# Patient Record
Sex: Male | Born: 1960 | Race: White | Hispanic: No | Marital: Married | Smoking: Current every day smoker
Health system: Southern US, Community
[De-identification: ages and names within clinical notes are randomized; demographics above are authoritative.]

## PROBLEM LIST (undated history)

## (undated) DIAGNOSIS — I77810 Thoracic aortic ectasia: Secondary | ICD-10-CM

## (undated) DIAGNOSIS — K219 Gastro-esophageal reflux disease without esophagitis: Secondary | ICD-10-CM

## (undated) DIAGNOSIS — I350 Nonrheumatic aortic (valve) stenosis: Secondary | ICD-10-CM

## (undated) DIAGNOSIS — J189 Pneumonia, unspecified organism: Secondary | ICD-10-CM

## (undated) DIAGNOSIS — E785 Hyperlipidemia, unspecified: Secondary | ICD-10-CM

## (undated) DIAGNOSIS — G4733 Obstructive sleep apnea (adult) (pediatric): Secondary | ICD-10-CM

## (undated) HISTORY — PX: ANTERIOR CRUCIATE LIGAMENT REPAIR: SHX115

## (undated) HISTORY — DX: Hyperlipidemia, unspecified: E78.5

## (undated) HISTORY — PX: VASECTOMY: SHX75

## (undated) HISTORY — DX: Gastro-esophageal reflux disease without esophagitis: K21.9

## (undated) HISTORY — DX: Obstructive sleep apnea (adult) (pediatric): G47.33

## (undated) HISTORY — PX: EYE SURGERY: SHX253

## (undated) HISTORY — DX: Nonrheumatic aortic (valve) stenosis: I35.0

---

## 2018-11-07 ENCOUNTER — Other Ambulatory Visit: Payer: Self-pay

## 2018-11-07 ENCOUNTER — Encounter: Payer: Self-pay | Admitting: Cardiology

## 2018-11-07 ENCOUNTER — Ambulatory Visit (INDEPENDENT_AMBULATORY_CARE_PROVIDER_SITE_OTHER): Payer: 59 | Admitting: Cardiology

## 2018-11-07 VITALS — BP 126/88 | HR 65 | Ht 69.0 in | Wt 190.4 lb

## 2018-11-07 DIAGNOSIS — E782 Mixed hyperlipidemia: Secondary | ICD-10-CM

## 2018-11-07 DIAGNOSIS — R079 Chest pain, unspecified: Secondary | ICD-10-CM

## 2018-11-07 DIAGNOSIS — R011 Cardiac murmur, unspecified: Secondary | ICD-10-CM | POA: Diagnosis not present

## 2018-11-07 DIAGNOSIS — R0600 Dyspnea, unspecified: Secondary | ICD-10-CM

## 2018-11-07 DIAGNOSIS — Q2381 Bicuspid aortic valve: Secondary | ICD-10-CM

## 2018-11-07 DIAGNOSIS — Z13220 Encounter for screening for lipoid disorders: Secondary | ICD-10-CM

## 2018-11-07 DIAGNOSIS — Q231 Congenital insufficiency of aortic valve: Secondary | ICD-10-CM

## 2018-11-07 DIAGNOSIS — Z720 Tobacco use: Secondary | ICD-10-CM

## 2018-11-07 DIAGNOSIS — R0789 Other chest pain: Secondary | ICD-10-CM

## 2018-11-07 HISTORY — DX: Bicuspid aortic valve: Q23.81

## 2018-11-07 HISTORY — DX: Congenital insufficiency of aortic valve: Q23.1

## 2018-11-07 HISTORY — DX: Chest pain, unspecified: R07.9

## 2018-11-07 HISTORY — DX: Mixed hyperlipidemia: E78.2

## 2018-11-07 LAB — LIPID PANEL
Chol/HDL Ratio: 4.7 ratio (ref 0.0–5.0)
Cholesterol, Total: 213 mg/dL — ABNORMAL HIGH (ref 100–199)
HDL: 45 mg/dL (ref >39)
LDL Chol Calc (NIH): 148 mg/dL — ABNORMAL HIGH (ref 0–99)
Triglycerides: 111 mg/dL (ref 0–149)
VLDL Cholesterol Cal: 20 mg/dL (ref 5–40)

## 2018-11-07 LAB — BASIC METABOLIC PANEL
BUN/Creatinine Ratio: 15 (ref 9–20)
BUN: 13 mg/dL (ref 6–24)
CO2: 26 mmol/L (ref 20–29)
Calcium: 9.6 mg/dL (ref 8.7–10.2)
Chloride: 104 mmol/L (ref 96–106)
Creatinine, Ser: 0.84 mg/dL (ref 0.76–1.27)
GFR calc Af Amer: 113 mL/min/{1.73_m2} (ref >59)
GFR calc non Af Amer: 98 mL/min/{1.73_m2} (ref >59)
Glucose: 90 mg/dL (ref 65–99)
Potassium: 4.7 mmol/L (ref 3.5–5.2)
Sodium: 142 mmol/L (ref 134–144)

## 2018-11-07 LAB — MAGNESIUM: Magnesium: 1.9 mg/dL (ref 1.6–2.3)

## 2018-11-07 MED ORDER — NITROGLYCERIN 0.4 MG SL SUBL: 0.4000 mg | SUBLINGUAL_TABLET | SUBLINGUAL | 5 refills | Status: DC | PRN

## 2018-11-07 NOTE — Patient Instructions (Signed)
Medication Instructions:  Your physician recommends that you continue on your current medications as directed. Please refer to the Current Medication list given to you today.  If you need a refill on your cardiac medications before your next appointment, please call your pharmacy.   Lab work:Your physician recommends that you return for lab work in: Today BMP,Magnesium.Lipid  If you have labs (blood work) drawn today and your tests are completely normal, you will receive your results only by: Marland Kitchen MyChart Message (if you have MyChart) OR . A paper copy in the mail If you have any lab test that is abnormal or we need to change your treatment, we will call you to review the results.  Testing/Procedures: Your physician has requested that you have an echocardiogram. Echocardiography is a painless test that uses sound waves to create images of your heart. It provides your doctor with information about the size and shape of your heart and how well your heart's chambers and valves are working. This procedure takes approximately one hour. There are no restrictions for this procedure.    Follow-Up: At [facility], you and your health needs are our priority.  As part of our continuing mission to provide you with exceptional heart care, we have created designated Provider Care Teams.  These Care Teams include your primary Cardiologist (physician) and Advanced Practice Providers (APPs -  Physician Assistants and Nurse Practitioners) who all work together to provide you with the care you need, when you need it. You will need a follow up appointment in 1 months with Dr Harriet Masson Any Other Special Instructions Will Be Listed Below (If Applicable).   Echocardiogram An echocardiogram is a procedure that uses painless sound waves (ultrasound) to produce an image of the heart. Images from an echocardiogram can provide important information about:  Signs of coronary artery disease (CAD).  Aneurysm detection. An  aneurysm is a weak or damaged part of an artery wall that bulges out from the normal force of blood pumping through the body.  Heart size and shape. Changes in the size or shape of the heart can be associated with certain conditions, including heart failure, aneurysm, and CAD.  Heart muscle function.  Heart valve function.  Signs of a past heart attack.  Fluid buildup around the heart.  Thickening of the heart muscle.  A tumor or infectious growth around the heart valves. Tell a health care provider about:  Any allergies you have.  All medicines you are taking, including vitamins, herbs, eye drops, creams, and over-the-counter medicines.  Any blood disorders you have.  Any surgeries you have had.  Any medical conditions you have.  Whether you are pregnant or may be pregnant. What are the risks? Generally, this is a safe procedure. However, problems may occur, including:  Allergic reaction to dye (contrast) that may be used during the procedure. What happens before the procedure? No specific preparation is needed. You may eat and drink normally. What happens during the procedure?   An IV tube may be inserted into one of your veins.  You may receive contrast through this tube. A contrast is an injection that improves the quality of the pictures from your heart.  A gel will be applied to your chest.  A wand-like tool (transducer) will be moved over your chest. The gel will help to transmit the sound waves from the transducer.  The sound waves will harmlessly bounce off of your heart to allow the heart images to be captured in real-time motion.  The images will be recorded on a computer. The procedure may vary among health care providers and hospitals. What happens after the procedure?  You may return to your normal, everyday life, including diet, activities, and medicines, unless your health care provider tells you not to do that. Summary  An echocardiogram is a  procedure that uses painless sound waves (ultrasound) to produce an image of the heart.  Images from an echocardiogram can provide important information about the size and shape of your heart, heart muscle function, heart valve function, and fluid buildup around your heart.  You do not need to do anything to prepare before this procedure. You may eat and drink normally.  After the echocardiogram is completed, you may return to your normal, everyday life, unless your health care provider tells you not to do that. This information is not intended to replace advice given to you by your health care provider. Make sure you discuss any questions you have with your health care provider. Document Released: 01/31/2000 Document Revised: 05/26/2018 Document Reviewed: 03/07/2016 Elsevier Patient Education  2020 [employer].

## 2018-11-07 NOTE — Progress Notes (Signed)
Cardiology Office Note:    Date:  11/07/2018   ID:  Cody Hebert, DOB 1961/01/20, MRN [id number]  PCP:  Marylen Ponto, MD  Cardiologist:  No primary care provider on file.  Electrophysiologist:  None   Referring MD: Marylen Ponto, MD   The patient self-referred  History of Present Illness:    Cody Hebert is a 58 y.o. male with a hx of GERD, hyperlipidemia he states he was told that he had a bicuspid aortic valve.  He saw a cardiologist about 10 years ago for a DOT physical clearance and had not follow-up since.  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  The patient reluctantly tells me that he is been having intermittent (about once a month) or or so he experiences left-sided chest tightness on exertion.  He describes it as a squeezing sensation which lasts about 2 to 3 minutes and resolved.  He notes at times it is associated shortness of breath.  He denies any radiation.  He denies any lightheadedness, nausea, tingling.  He is not experiencing chest pain today.  Past Medical History:  Diagnosis Date  . GERD (gastroesophageal reflux disease)   . Hyperlipidemia     Past Surgical History:  Procedure Laterality Date  . ANTERIOR CRUCIATE LIGAMENT REPAIR    . EYE SURGERY    . VASECTOMY      Current Medications: Current Meds  Medication Sig  . loratadine (CLARITIN) 10 MG tablet Take 10 mg by mouth daily as needed for allergies.  Marland Kitchen omeprazole (PRILOSEC) 20 MG capsule Take 20 mg by mouth daily.    Allergies:   Doxycycline   Social History   Socioeconomic History  . Marital status: Single    Spouse name: Not on file  . Number of children: Not on file  . Years of education: Not on file  . Highest education level: Not on file  Occupational History  . Not on file  Social Needs  . Financial resource strain: Not on file  . Food insecurity    Worry: Not on file    Inability: Not on file  . Transportation  needs    Medical: Not on file    Non-medical: Not on file  Tobacco Use  . Smoking status: Current Every Day Smoker    Packs/day: 2.00    Years: 40.00    Pack years: 80.00    Types: Cigarettes  . Smokeless tobacco: Never Used  Substance and Sexual Activity  . Alcohol use: Yes  . Drug use: Never  . Sexual activity: Not on file  Lifestyle  . Physical activity    Days per week: Not on file    Minutes per session: Not on file  . Stress: Not on file  Relationships  . Social Musician on phone: Not on file    Gets together: Not on file    Attends religious service: Not on file    Active member of club or organization: Not on file    Attends meetings of clubs or organizations: Not on file    Relationship status: Not on file  Other Topics Concern  . Not on file  Social History Narrative  . Not on file     Family History: The patient's family history includes Cancer in his father; Congestive Heart Failure in his maternal grandmother; Diabetes in his brother.  ROS:   Review of Systems  Constitution: Negative  for decreased appetite, fever and weight gain.  HENT: Negative for congestion, ear discharge, hoarse voice and sore throat.   Eyes: Negative for discharge, redness, vision loss in right eye and visual halos.  Cardiovascular: He reports chest pain and shortness of breath.  Negative for leg swelling, orthopnea and palpitations.  Respiratory: Negative for cough, hemoptysis, shortness of breath and snoring.   Endocrine: Negative for heat intolerance and polyphagia.  Hematologic/Lymphatic: Negative for bleeding problem. Does not bruise/bleed easily.  Skin: Negative for flushing, nail changes, rash and suspicious lesions.  Musculoskeletal: Negative for arthritis, joint pain, muscle cramps, myalgias, neck pain and stiffness.  Gastrointestinal: Negative for abdominal pain, bowel incontinence, diarrhea and excessive appetite.  Genitourinary: Negative for decreased libido,  genital sores and incomplete emptying.  Neurological: Negative for brief paralysis, focal weakness, headaches and loss of balance.  Psychiatric/Behavioral: Negative for altered mental status, depression and suicidal ideas.  Allergic/Immunologic: Negative for HIV exposure and persistent infections.   EKGs/Labs/Other Studies Reviewed:    The following studies were reviewed today:  EKG:  The ekg ordered today demonstrates sinus rhythm, heart rate 65 bpm, there is evidence of left atrial abnormality with left ventricle hypertrophy.  No previous EKG to compare  Recent Labs: No results found for requested labs within last 8760 hours.  Recent Lipid Panel No results found for: CHOL, TRIG, HDL, CHOLHDL, VLDL, LDLCALC, LDLDIRECT  Physical Exam:    VS:  BP 126/88 (BP Location: Left Arm, Patient Position: Sitting, Cuff Size: Normal)   Pulse 65   Ht 5\' 9"  (1.753 m)   Wt 190 lb 6.4 oz (86.4 kg)   SpO2 98%   BMI 28.12 kg/m     Wt Readings from Last 3 Encounters:  11/07/18 190 lb 6.4 oz (86.4 kg)     GEN: Well nourished, well developed in no acute distress HEENT: Normal NECK: No JVD; No carotid bruits LYMPHATICS: No lymphadenopathy CARDIAC: S1S2 noted,RRR, II/VI midsystolic ejection murmurs heard over the precordium, rubs, gallops RESPIRATORY:  Clear to auscultation without rales, wheezing or rhonchi  ABDOMEN: Soft, non-tender, non-distended, +bowel sounds, no guarding. EXTREMITIES: No edema, No cyanosis, no clubbing MUSCULOSKELETAL:  No edema; No deformity  SKIN: Warm and dry, tattoos present  History of hyperlipidemia hyperlipidemia NEUROLOGIC:  Alert and oriented x 3, non-focal PSYCHIATRIC:  Normal affect, good insight  ASSESSMENT:    1. Bicuspid aortic valve   2. Systolic ejection murmur   3. Dyspnea, unspecified type   4. Mixed hyperlipidemia   5. Screening, lipid   6. Chest pain of uncertain etiology   7. Tobacco abuse    PLAN:    In order of problems listed above:  1.   I have ordered a transthoracic echocardiogram and this is important especially in the setting of a history of bicuspid valve and he has low pitched mid systolic ejection murmur.    2.  His chest pain does sound atypical however he does have risk factors for coronary disease; smoking, hyperlipidemia and age.  But at this juncture it is important to understand if he indeed have aortic stenosis from his bicuspid valve and the severity.  This will help guide his ischemia work-up.  As if he does have severe aortic stenosis we would proceed to left heart catheterization as opposed to stress testing.  3.  The patient was counseled on tobacco cessation today.  Counseling included reviewing the risks of smoking tobacco products, how it impacts the patient's current medical diagnoses and different strategies for quitting.  Pharmacotherapy to aid in tobacco cessation was not prescribed today. The patient coordinate with  primary care provider.  The patient was also advised to call   1-800-QUIT-NOW ([phone]) for additional help with quitting smoking.  4.  Blood work will be performed today which will include lipid panel.   The patient is in agreement with the above plan. The patient left the office in stable condition.  The patient will follow up in 1 month or sooner if needed.   Medication Adjustments/Labs and Tests Ordered: Current medicines are reviewed at length with the patient today.  Concerns regarding medicines are outlined above.  Orders Placed This Encounter  Procedures  . Basic Metabolic Panel (BMET)  . Magnesium  . Lipid Profile  . EKG 12-Lead  . ECHOCARDIOGRAM COMPLETE   Meds ordered this encounter  Medications  . DISCONTD: nitroGLYCERIN (NITROSTAT) 0.4 MG SL tablet    Sig: Place 1 tablet (0.4 mg total) under the tongue every 5 (five) minutes as needed for chest pain.    Dispense:  25 tablet    Refill:  5    Patient Instructions  Medication Instructions:  Your physician  recommends that you continue on your current medications as directed. Please refer to the Current Medication list given to you today.  If you need a refill on your cardiac medications before your next appointment, please call your pharmacy.   Lab work:Your physician recommends that you return for lab work in: Today BMP,Magnesium.Lipid  If you have labs (blood work) drawn today and your tests are completely normal, you will receive your results only by: Marland Kitchen. MyChart Message (if you have MyChart) OR . A paper copy in the mail If you have any lab test that is abnormal or we need to change your treatment, we will call you to review the results.  Testing/Procedures: Your physician has requested that you have an echocardiogram. Echocardiography is a painless test that uses sound waves to create images of your heart. It provides your doctor with information about the size and shape of your heart and how well your heart's chambers and valves are working. This procedure takes approximately one hour. There are no restrictions for this procedure.    Follow-Up: At [facility], you and your health needs are our priority.  As part of our continuing mission to provide you with exceptional heart care, we have created designated Provider Care Teams.  These Care Teams include your primary Cardiologist (physician) and Advanced Practice Providers (APPs -  Physician Assistants and Nurse Practitioners) who all work together to provide you with the care you need, when you need it. You will need a follow up appointment in 1 months with Dr Servando Salinaobb Any Other Special Instructions Will Be Listed Below (If Applicable).   Echocardiogram An echocardiogram is a procedure that uses painless sound waves (ultrasound) to produce an image of the heart. Images from an echocardiogram can provide important information about:  Signs of coronary artery disease (CAD).  Aneurysm detection. An aneurysm is a weak or damaged part of an  artery wall that bulges out from the normal force of blood pumping through the body.  Heart size and shape. Changes in the size or shape of the heart can be associated with certain conditions, including heart failure, aneurysm, and CAD.  Heart muscle function.  Heart valve function.  Signs of a past heart attack.  Fluid buildup around the heart.  Thickening of the heart muscle.  A tumor or infectious growth around the heart  valves. Tell a health care provider about:  Any allergies you have.  All medicines you are taking, including vitamins, herbs, eye drops, creams, and over-the-counter medicines.  Any blood disorders you have.  Any surgeries you have had.  Any medical conditions you have.  Whether you are pregnant or may be pregnant. What are the risks? Generally, this is a safe procedure. However, problems may occur, including:  Allergic reaction to dye (contrast) that may be used during the procedure. What happens before the procedure? No specific preparation is needed. You may eat and drink normally. What happens during the procedure?   An IV tube may be inserted into one of your veins.  You may receive contrast through this tube. A contrast is an injection that improves the quality of the pictures from your heart.  A gel will be applied to your chest.  A wand-like tool (transducer) will be moved over your chest. The gel will help to transmit the sound waves from the transducer.  The sound waves will harmlessly bounce off of your heart to allow the heart images to be captured in real-time motion. The images will be recorded on a computer. The procedure may vary among health care providers and hospitals. What happens after the procedure?  You may return to your normal, everyday life, including diet, activities, and medicines, unless your health care provider tells you not to do that. Summary  An echocardiogram is a procedure that uses painless sound waves  (ultrasound) to produce an image of the heart.  Images from an echocardiogram can provide important information about the size and shape of your heart, heart muscle function, heart valve function, and fluid buildup around your heart.  You do not need to do anything to prepare before this procedure. You may eat and drink normally.  After the echocardiogram is completed, you may return to your normal, everyday life, unless your health care provider tells you not to do that. This information is not intended to replace advice given to you by your health care provider. Make sure you discuss any questions you have with your health care provider. Document Released: 01/31/2000 Document Revised: 05/26/2018 Document Reviewed: 03/07/2016 Elsevier Patient Education  2020 [employer].       Adopting a Healthy Lifestyle.  Know what a healthy weight is for you (roughly BMI <25) and aim to maintain this   Aim for 7+ servings of fruits and vegetables daily   65-80+ fluid ounces of water or unsweet tea for healthy kidneys   Limit to max 1 drink of alcohol per day; avoid smoking/tobacco   Limit animal fats in diet for cholesterol and heart health - choose grass fed whenever available   Avoid highly processed foods, and foods high in saturated/trans fats   Aim for low stress - take time to unwind and care for your mental health   Aim for 150 min of moderate intensity exercise weekly for heart health, and weights twice weekly for bone health   Aim for 7-9 hours of sleep daily   When it comes to diets, agreement about the perfect plan isnt easy to find, even among the experts. Experts at the [employer] developed an idea known as the Healthy Eating Plate. Just imagine a plate divided into logical, healthy portions.   The emphasis is on diet quality:   Load up on vegetables and fruits - one-half of your plate: Aim for color and variety, and remember that potatoes dont count.  Go for whole grains - one-quarter of your plate: Whole wheat, barley, wheat berries, quinoa, oats, brown rice, and foods made with them. If you want pasta, go with whole wheat pasta.   Protein power - one-quarter of your plate: Fish, chicken, beans, and nuts are all healthy, versatile protein sources. Limit red meat.   The diet, however, does go beyond the plate, offering a few other suggestions.   Use healthy plant oils, such as olive, canola, soy, corn, sunflower and peanut. Check the labels, and avoid partially hydrogenated oil, which have unhealthy trans fats.   If youre thirsty, drink water. Coffee and tea are good in moderation, but skip sugary drinks and limit milk and dairy products to one or two daily servings.   The type of carbohydrate in the diet is more important than the amount. Some sources of carbohydrates, such as vegetables, fruits, whole grains, and beans-are healthier than others.   Finally, stay active  Signed, Thomasene RippleKardie Moani Weipert, DO  11/07/2018 9:07 AM    [facility]

## 2018-11-08 ENCOUNTER — Telehealth: Payer: Self-pay | Admitting: *Deleted

## 2018-11-08 MED ORDER — ATORVASTATIN CALCIUM 20 MG PO TABS: 20.0000 mg | ORAL_TABLET | Freq: Every day | ORAL | 1 refills | Status: DC

## 2018-11-08 NOTE — Telephone Encounter (Signed)
-----   Message from Berniece Salines, DO sent at 11/08/2018  8:15 AM EDT ----- Please let him know that his cholesterol and his LDL are both high. I will like to start lipitor 20mg  at bedtime if is ok with it

## 2018-11-08 NOTE — Addendum Note (Signed)
Addended by: Particia Nearing B on: 11/08/2018 11:46 AM   Modules accepted: Orders

## 2018-11-08 NOTE — Telephone Encounter (Signed)
Telephone call to patient. Left message to return call regarding labs and medications

## 2018-11-08 NOTE — Telephone Encounter (Signed)
Patient called back. Informed of labs and need to start Lipitor 20 mg daily.Pt agreeable and script sent to [facility] on [address] per pt request

## 2018-11-10 ENCOUNTER — Ambulatory Visit (HOSPITAL_BASED_OUTPATIENT_CLINIC_OR_DEPARTMENT_OTHER)
Admission: RE | Admit: 2018-11-10 | Discharge: 2018-11-10 | Disposition: A | Discharge status: Home / Self Care | Payer: 59 | Source: Ambulatory Visit | Attending: Cardiology | Admitting: Cardiology

## 2018-11-10 ENCOUNTER — Other Ambulatory Visit: Payer: Self-pay

## 2018-11-10 DIAGNOSIS — Q231 Congenital insufficiency of aortic valve: Secondary | ICD-10-CM | POA: Diagnosis present

## 2018-11-10 NOTE — Progress Notes (Signed)
  Echocardiogram 2D Echocardiogram has been performed.  Cody Hebert 11/10/2018, 9:06 AM

## 2018-11-14 ENCOUNTER — Other Ambulatory Visit: Payer: Self-pay

## 2018-11-14 ENCOUNTER — Telehealth: Payer: Self-pay | Admitting: *Deleted

## 2018-11-14 ENCOUNTER — Encounter: Payer: Self-pay | Admitting: Cardiology

## 2018-11-14 ENCOUNTER — Ambulatory Visit (INDEPENDENT_AMBULATORY_CARE_PROVIDER_SITE_OTHER): Payer: 59 | Admitting: Cardiology

## 2018-11-14 VITALS — BP 100/86 | HR 77 | Ht 69.0 in | Wt 189.0 lb

## 2018-11-14 DIAGNOSIS — I350 Nonrheumatic aortic (valve) stenosis: Secondary | ICD-10-CM

## 2018-11-14 DIAGNOSIS — Z01812 Encounter for preprocedural laboratory examination: Secondary | ICD-10-CM | POA: Diagnosis not present

## 2018-11-14 DIAGNOSIS — Q231 Congenital insufficiency of aortic valve: Secondary | ICD-10-CM

## 2018-11-14 DIAGNOSIS — E785 Hyperlipidemia, unspecified: Secondary | ICD-10-CM

## 2018-11-14 MED ORDER — ASPIRIN EC 81 MG PO TBEC: 81.0000 mg | DELAYED_RELEASE_TABLET | Freq: Every day | ORAL | 3 refills | Status: DC

## 2018-11-14 NOTE — H&P (View-Only) (Signed)
Cardiology Office Note:    Date:  11/14/2018   ID:  Cody Hebert, DOB 05/10/1960, MRN [id number]  PCP:  System, Pcp Not In  Cardiologist:  No primary care provider on file.  Electrophysiologist:  None   Referring MD: No ref. provider found   Follow-up visit  History of Present Illness:    Cody Hebert is a 58 y.o. 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  During his evaluation he reported he had been experiencing left-sided chest pain for a month.  His physical exam was remarkable with a low pitched mid ejection systolic murmur which was concerning for aortic stenosis.  An echocardiogram was ordered to assess the severity of the aortic stenosis.  He was able to undergo his echocardiogram which revealed a very severe aortic stenosis.   At this time the patient is here to discuss management plan. The patient is here with his wife.   Past Medical History:  Diagnosis Date  . GERD (gastroesophageal reflux disease)   . Hyperlipidemia     Past Surgical History:  Procedure Laterality Date  . ANTERIOR CRUCIATE LIGAMENT REPAIR    . EYE SURGERY    . VASECTOMY      Current Medications: Current Meds  Medication Sig  . atorvastatin (LIPITOR) 20 MG tablet Take 1 tablet (20 mg total) by mouth daily.  . loratadine (CLARITIN) 10 MG tablet Take 10 mg by mouth daily as needed for allergies.  . nitroGLYCERIN (NITROSTAT) 0.4 MG SL tablet PLACE 1 TABLET (0.4 MG TOTAL) UNDER THE TONGUE EVERY 5 (FIVE) MINUTES AS NEEDED FOR CHEST PAIN.  . omeprazole (PRILOSEC) 20 MG capsule Take 20 mg by mouth daily.     Allergies:   Doxycycline   Social History   Socioeconomic History  . Marital status: Married    Spouse name: Not on file  . Number of children: Not on file  . Years of education: Not on file  . Highest education level: Not on file  Occupational History  . Not on file  Social Needs  .  Financial resource strain: Not on file  . Food insecurity    Worry: Not on file    Inability: Not on file  . Transportation needs    Medical: Not on file    Non-medical: Not on file  Tobacco Use  . Smoking status: Current Every Day Smoker    Packs/day: 2.00    Years: 40.00    Pack years: 80.00    Types: Cigarettes  . Smokeless tobacco: Never Used  Substance and Sexual Activity  . Alcohol use: Yes  . Drug use: Never  . Sexual activity: Not on file  Lifestyle  . Physical activity    Days per week: Not on file    Minutes per session: Not on file  . Stress: Not on file  Relationships  . Social connections    Talks on phone: Not on file    Gets together: Not on file    Attends religious service: Not on file    Active member of club or organization: Not on file    Attends meetings of clubs or organizations: Not on file    Relationship status: Not on file  Other Topics Concern  . Not on file  Social History Narrative  . Not on file     Family History: The patient's family history includes Cancer in his father; Congestive Heart Failure   in his maternal grandmother; Diabetes in his brother.  ROS:   Review of Systems  Constitution: Negative for decreased appetite, fever and weight gain.  HENT: Negative for congestion, ear discharge, hoarse voice and sore throat.   Eyes: Negative for discharge, redness, vision loss in right eye and visual halos.  Cardiovascular: Negative for chest pain, dyspnea on exertion, leg swelling, orthopnea and palpitations.  Respiratory: Negative for cough, hemoptysis, shortness of breath and snoring.   Endocrine: Negative for heat intolerance and polyphagia.  Hematologic/Lymphatic: Negative for bleeding problem. Does not bruise/bleed easily.  Skin: Negative for flushing, nail changes, rash and suspicious lesions.  Musculoskeletal: Negative for arthritis, joint pain, muscle cramps, myalgias, neck pain and stiffness.  Gastrointestinal: Negative for  abdominal pain, bowel incontinence, diarrhea and excessive appetite.  Genitourinary: Negative for decreased libido, genital sores and incomplete emptying.  Neurological: Negative for brief paralysis, focal weakness, headaches and loss of balance.  Psychiatric/Behavioral: Negative for altered mental status, depression and suicidal ideas.  Allergic/Immunologic: Negative for HIV exposure and persistent infections.    EKGs/Labs/Other Studies Reviewed:    The following studies were reviewed today:   EKG:  None performed today.  TTE IMPRESSIONS   1. Left ventricular ejection fraction, by visual estimation, is 60 to 65%. The left ventricle has normal function. Normal left ventricular size. There is mildly increased left ventricular hypertrophy.  2. Left ventricular diastolic Doppler parameters are consistent with impaired relaxation pattern of LV diastolic filling.  3. Global right ventricle has normal systolic function.The right ventricular size is normal. No increase in right ventricular wall thickness.  4. Left atrial size was normal.  5. Right atrial size was normal.  6. Mild to moderate mitral annular calcification.  7. The mitral valve is normal in structure. No evidence of mitral valve regurgitation. No evidence of mitral stenosis.  8. The tricuspid valve is normal in structure. Tricuspid valve regurgitation was not visualized by color flow Doppler.  9. The aortic valve is bicuspid Aortic valve regurgitation is mild by color flow Doppler. Severe aortic valve stenosis. 10. The pulmonic valve was normal in structure. Pulmonic valve regurgitation is not visualized by color flow Doppler. 11. There is mild to moderate dilatation of the ascending aorta measuring 44 mm. 12. Moderately elevated pulmonary artery systolic pressure. 13. The inferior vena cava is normal in size with greater than 50% respiratory variability, suggesting right atrial pressure of 3 mmHg.   Recent Labs: 11/07/2018: BUN  13; Creatinine, Ser 0.84; Magnesium 1.9; Potassium 4.7; Sodium 142  Recent Lipid Panel    Component Value Date/Time   CHOL 213 (H) 11/07/2018 0901   TRIG 111 11/07/2018 0901   HDL 45 11/07/2018 0901   CHOLHDL 4.7 11/07/2018 0901   LDLCALC 148 (H) 11/07/2018 0901    Physical Exam:    VS:  BP 100/86 (BP Location: Right Arm, Patient Position: Sitting, Cuff Size: Normal)   Pulse 77   Ht 5' 9" (1.753 m)   Wt 189 lb (85.7 kg)   SpO2 96%   BMI 27.91 kg/m     Wt Readings from Last 3 Encounters:  11/14/18 189 lb (85.7 kg)  11/07/18 190 lb 6.4 oz (86.4 kg)     GEN: Well nourished, well developed in no acute distress HEENT: Normal NECK: No JVD; No carotid bruits LYMPHATICS: No lymphadenopathy CARDIAC: S1S2 noted,RRR,  II/VI midsystolic ejection murmurs, rubs, gallops RESPIRATORY:  Clear to auscultation without rales, wheezing or rhonchi  ABDOMEN: Soft, non-tender, non-distended, +bowel sounds, no guarding. EXTREMITIES:   No edema, No cyanosis, no clubbing MUSCULOSKELETAL:  No edema; No deformity  SKIN: Warm and dry NEUROLOGIC:  Alert and oriented x 3, non-focal PSYCHIATRIC:  Normal affect, good insight  ASSESSMENT:    1. Severe aortic valve stenosis   2. Dyslipidemia   3. Bicuspid aortic valve   4. Pre-procedure lab exam    PLAN:    1. Very Severe Aortic Stenosis - his echo reports AVA by VTI 0.50 cm, mean gradient 72.2 mmHG. He needs a valve replacement.  I have educated patient on the severity of his condition. He has been referred to CTS. He has also been schedule for a left heart catheterization to assess his coronaries arteries in planning of his upcoming procedure.    2. His lab work showed evidence of dyslipidemia and he was started on atorvastatin 20 mg to his visit.  He reports he has been taking it since.  3.  He was also started on aspirin 81 mg today.   4. 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 He was advised that if he has any  worsening chest pain, shortness of breath or any syncope episodes to go to the nearest emergency department. All of their questions to their satisfaction.  I personally reviewed outside records for today's encounter.  This included review of historical hospital records, office notes, cardiac studies (e.g. Echocardiograms, Cardiac Catheterizations for upcoming AVR.  The pertinent findings are outlined in my note.  The total non-face to face time spent for record review was 35 minutes.  The patient and his wife is in agreement with the above plan. The patient left the office in stable condition.  The patient will follow up in 1 month.     Medication Adjustments/Labs and Tests Ordered: Current medicines are reviewed at length with the patient today.  Concerns regarding medicines are outlined above.  Orders Placed This Encounter  Procedures  . Basic Metabolic Panel (BMET)  . CBC  . Ambulatory referral to Cardiothoracic Surgery   Meds ordered this encounter  Medications  . aspirin EC 81 MG tablet    Sig: Take 1 tablet (81 mg total) by mouth daily.    Dispense:  90 tablet    Refill:  3    Patient Instructions  Medication Instructions:  Your physician has recommended you make the following change in your medication:   START: Aspirin 81 mg Take 1 tab daily   If you need a refill on your cardiac medications before your next appointment, please call your pharmacy.   Lab work: Your physician recommends that you return for lab work in: TODAY CBC,BMP  If you have labs (blood work) drawn today and your tests are completely normal, you will receive your results only by: . MyChart Message (if you have MyChart) OR . A paper copy in the mail If you have any lab test that is abnormal or we need to change your treatment, we will call you to review the results.  Testing/Procedures:   You have a COVID 19 screening appointment [address] [place]. Friday Oct 2,2020 at 10:15AMamb     [facility] [address] [place] [address] Dept: [phone] Loc: [phone]  Bodee Dowd  11/14/2018  You are scheduled for a Cardiac Catheterization on Tuesday, October 6 with Dr. Jayadeep Varanasi.  1. Please arrive at the [facility] Tower (Main Entrance A) at [facility]: [address] [place], [place] [address] at 5:30 AM (This time is two hours before your procedure to ensure your preparation). Free valet parking service is available.   Special note: Every effort is made to have your procedure done on time. Please understand that emergencies sometimes delay scheduled procedures.  2. Diet: Do not eat solid foods after midnight.  The patient may have clear liquids until 5am upon the day of the procedure.  3. Labs: None needed 4. Medication instructions in preparation for your procedure:  On the morning of your procedure, take your Aspirin and any morning medicines NOT listed above.  You may use sips of water.  5. Plan for one night stay--bring personal belongings. 6. Bring a current list of your medications and current insurance cards. 7. You MUST have a responsible person to drive you home. 8. Someone MUST be with you the first 24 hours after you arrive home or your discharge will be delayed. 9. Please wear clothes that are easy to get on and off and wear slip-on shoes.  Thank you for allowing us to care for you!   -- [facility]   Follow-Up: At [facility], you and your health needs are our priority.  As part of our continuing mission to provide you with exceptional heart care, we have created designated Provider Care Teams.  These Care Teams include your primary Cardiologist (physician) and Advanced Practice Providers (APPs -  Physician Assistants and Nurse Practitioners) who all work together to provide you with the care you need, when you need  it. You will need a follow up appointment in 1 months with Dr Jla Reynolds Any Other Special Instructions Will Be Listed Below (If Applicable).  You have a referral to Dr. Bartle at Cardiothoracic Surgery. They will contact you regarding an appointment.       Aortic Valve Stenosis   Aortic valve stenosis is a narrowing of the aortic valve in the heart. The aortic valve opens and closes to regulate blood flow between the left side of the heart (left ventricle) and the artery that leads away from the heart (aorta). When the aortic valve becomes narrow, it is difficult for the heart to pump blood out to the body, which causes the heart to work harder. The extra work can weaken the heart muscle over time. Aortic valve stenosis can range from mild to severe. If it is not treated, it can become more severe over time and lead to heart failure. What are the causes? This condition may be caused by:  Buildup of calcium around and on the aortic valve. This can occur with aging. This is the most common cause of aortic valve stenosis.  A heart problem that developed in the womb (birth defect).  Rheumatic fever.  Radiation to the chest. What increases the risk? You may be more likely to develop this condition if:  You are older than age 65.  You were born with an abnormal bicuspid valve. What are the signs or symptoms? You may not have any symptoms until your condition becomes severe. It may take 10-20 years for mild or moderate aortic valve stenosis to become severe. Symptoms may include:  Shortness of breath. This may get worse during physical activity.  Feeling unusually weak and tired (fatigue).  Extreme discomfort in the chest, neck, or arm during physical activity (angina).  A heartbeat that is irregular or faster than normal (palpitations).  Dizziness or fainting. This may happen when you get physically tired or   after you take certain heart medicines, such as nitroglycerin. How is this  diagnosed? This condition may be diagnosed with:  A physical exam.  Echocardiogram. This is a type of imaging test that uses sound waves (ultrasound) to make images of your heart. There are two kinds of this test that may be used. ? Transthoracic echocardiogram (TTE). For this type, a wand-like tool (transducer) is moved over your chest to create ultrasound images that are recorded by a computer. ? Transesophageal echocardiogram (TEE). For this type, a flexible tube (probe) is inserted down the part of the body that moves food from your mouth to your stomach (esophagus). The heart and the esophagus are close to each other. Your health care provider will use the probe to take clear, detailed pictures of the heart.  Cardiac catheterization. For this procedure, a small, thin tube (catheter) is passed through a large vein in your neck, groin, or arm. The catheter is used to get information about arteries, structures, blood pressure, and oxygen levels in your heart.  Stress tests. These are tests that evaluate the blood supply to your heart and your heart's response to exercise. You may work with a health care provider who specializes in the heart (cardiologist) for diagnosis and treatment. How is this treated? Treatment depends on how severe your condition is and what your symptoms are. You will need to have your heart checked regularly to make sure that your condition is not getting worse or causing serious problems. Treatment may also include:  Surgery to replace your aortic valve. This is the most common treatment for aortic valve stenosis, and it is the only treatment to cure the condition. Several types of surgeries are available. The surgery may be done: ? Through a large incision over your heart (open-heart surgery). ? Through small incisions, using a flexible tube called a catheter (transcatheter aortic valve replacement, TAVR).  Medicines that help to keep your heart rate regular.   Medicines that thin your blood (anticoagulants) to prevent blood clots.  Antibiotic medicines to help prevent infection. If your condition is mild, you may only need regular follow-up visits for monitoring. Follow these instructions at home: Lifestyle  Limit alcohol intake to no more than 1 drink a day for nonpregnant women and 2 drinks a day for men. One drink equals 12 oz of beer, 5 oz of wine, or 1 oz of hard liquor.  Do not use any products that contain nicotine or tobacco, such as cigarettes and e-cigarettes. If you need help quitting, ask your health care provider.  Work with your health care provider to manage your blood pressure and cholesterol.  Maintain a healthy weight. Eating and drinking    Eat a heart-healthy diet that includes plenty of fresh fruits and vegetables, whole grains, lean protein, and low-fat or nonfat dairy.  Limit how much caffeine you drink. Caffeine can affect your heart's rate and rhythm.  Avoid foods that are: ? High in salt (sodium), saturated fat, or sugar. ? Canned or highly processed. ? Fried.  Follow instructions from your health care provider about any other eating or drinking restrictions. Activity  Exercise regularly and return to your normal activities as told by your health care provider. Ask your health care provider what amount and type of physical activity is safe for you. ? If your aortic valve stenosis is mild, you may only need to avoid very intense physical activity, such as heavy weight lifting. ? The more severe your aortic valve stenosis is, the   more activities you may need to avoid. If you are taking blood thinners:  Before you take any medicines that contain aspirin or NSAIDs, talk with your health care provider. These medicines increase your risk for dangerous bleeding.  Take your medicine exactly as told, at the same time every day.  Avoid activities that could cause injury or bruising, and follow instructions about how  to prevent falls.  Wear a medical alert bracelet or carry a card that lists what medicines you take. General instructions  Take over-the-counter and prescription medicines only as told by your health care provider.  If you were prescribed an antibiotic, take it as told by your health care provider. Do not stop taking the antibiotic even if you start to feel better.  If you are a woman and you plan to become pregnant, talk with your health care provider before you become pregnant.  Before you have any type of medical or dental procedure or surgery, tell all health care providers that you have aortic valve stenosis. This may affect the treatment that you receive.  Keep all follow-up visits as told by your health care provider. This is important. Contact a health care provider if:  You have a fever. Get help right away if:  You develop any of the following symptoms: ? Chest pain. ? Chest tightness. ? Shortness of breath. ? Trouble breathing.  You feel light-headed.  You feel like you might faint.  Your heartbeat is irregular or faster than normal. These symptoms may represent a serious problem that is an emergency. Do not wait to see if the symptoms will go away. Get medical help right away. Call your local emergency services (911 in the [place] Do not drive yourself to the hospital. Summary  Aortic valve stenosis is a narrowing of the aortic valve in the heart. The aortic valve opens and closes to regulate blood flow between the left side of the heart (left ventricle) and the artery that leads away from the heart (aorta).  Aortic valve stenosis can range from mild to severe. If it is not treated, it can become more severe over time and lead to heart failure.  Treatment depends on how severe your condition is and what your symptoms are. You will need to have your heart checked regularly to make sure that your condition is not getting worse or causing serious problems.  Exercise  regularly and return to your normal activities as told by your health care provider. Ask your health care provider what amount and type of physical activity is safe for you. This information is not intended to replace advice given to you by your health care provider. Make sure you discuss any questions you have with your health care provider. Document Released: 11/01/2002 Document Revised: 01/15/2017 Document Reviewed: 11/05/2016 Elsevier Patient Education  2020 Elsevier Inc.     Adopting a Healthy Lifestyle.  Know what a healthy weight is for you (roughly BMI <25) and aim to maintain this   Aim for 7+ servings of fruits and vegetables daily   65-80+ fluid ounces of water or unsweet tea for healthy kidneys   Limit to max 1 drink of alcohol per day; avoid smoking/tobacco   Limit animal fats in diet for cholesterol and heart health - choose grass fed whenever available   Avoid highly processed foods, and foods high in saturated/trans fats   Aim for low stress - take time to unwind and care for your mental health   Aim for 150 min   of moderate intensity exercise weekly for heart health, and weights twice weekly for bone health   Aim for 7-9 hours of sleep daily   When it comes to diets, agreement about the perfect plan isnt easy to find, even among the experts. Experts at the [employer] developed an idea known as the Healthy Eating Plate. Just imagine a plate divided into logical, healthy portions.   The emphasis is on diet quality:   Load up on vegetables and fruits - one-half of your plate: Aim for color and variety, and remember that potatoes dont count.   Go for whole grains - one-quarter of your plate: Whole wheat, barley, wheat berries, quinoa, oats, brown rice, and foods made with them. If you want pasta, go with whole wheat pasta.   Protein power - one-quarter of your plate: Fish, chicken, beans, and nuts are all healthy, versatile protein sources.  Limit red meat.   The diet, however, does go beyond the plate, offering a few other suggestions.   Use healthy plant oils, such as olive, canola, soy, corn, sunflower and peanut. Check the labels, and avoid partially hydrogenated oil, which have unhealthy trans fats.   If youre thirsty, drink water. Coffee and tea are good in moderation, but skip sugary drinks and limit milk and dairy products to one or two daily servings.   The type of carbohydrate in the diet is more important than the amount. Some sources of carbohydrates, such as vegetables, fruits, whole grains, and beans-are healthier than others.   Finally, stay active  Signed, Avani Sensabaugh, DO  11/14/2018 4:18 PM    [facility] 

## 2018-11-14 NOTE — Telephone Encounter (Signed)
-----   Message from Berniece Salines, DO sent at 11/12/2018  5:38 PM EDT ----- Mickel Baas, I called patient. No answer. I left a message to have him call Monday morning. I will like to see him Monday afternoon or on Tuesday. Please also try to call him. Please let him know that he has a very severe aortic stenosis. I need to see him to discuss treatment plan for him as this is critical to address his valve now.

## 2018-11-14 NOTE — Telephone Encounter (Signed)
Telephone call to patient. Informed of echo results and need to be seen today. Appointment made for 1355 PM 9/28//20.Patient aware and coming

## 2018-11-14 NOTE — Patient Instructions (Addendum)
Medication Instructions:  Your physician has recommended you make the following change in your medication:   START: Aspirin 81 mg Take 1 tab daily   If you need a refill on your cardiac medications before your next appointment, please call your pharmacy.   Lab work: Your physician recommends that you return for lab work in: TODAY CBC,BMP  If you have labs (blood work) drawn today and your tests are completely normal, you will receive your results only by: Marland Kitchen MyChart Message (if you have MyChart) OR . A paper copy in the mail If you have any lab test that is abnormal or we need to change your treatment, we will call you to review the results.  Testing/Procedures:   You have a COVID 19 screening appointment [place]. Friday Oct 2,2020 at 10:15AMamb    [place] [id number] Dept: [phone] Loc: [phone]  Yandel Zeiner  11/14/2018  You are scheduled for a Cardiac Catheterization on Tuesday, October 6 with Dr. Larae Grooms.  1. Please arrive at the [facility] (Main Entrance A) at [facility]: [address] [place],  [id number] at 5:30 AM (This time is two hours before your procedure to ensure your preparation). Free valet parking service is available.   Special note: Every effort is made to have your procedure done on time. Please understand that emergencies sometimes delay scheduled procedures.  2. Diet: Do not eat solid foods after midnight.  The patient may have clear liquids until 5am upon the day of the procedure.  3. Labs: None needed 4. Medication instructions in preparation for your procedure:  On the morning of your procedure, take your Aspirin and any morning medicines NOT listed above.  You may use sips of water.  5. Plan for one night stay--bring personal belongings. 6. Bring a current list of your medications  and current insurance cards. 7. You MUST have a responsible person to drive you home. 8. Someone MUST be with you the first 24 hours after you arrive home or your discharge will be delayed. 9. Please wear clothes that are easy to get on and off and wear slip-on shoes.  Thank you for allowing [place] to care for you!   --  Invasive Cardiovascular services   Follow-Up: At [facility], you and your health needs are our priority.  As part of our continuing mission to provide you with exceptional heart care, we have created designated Provider Care Teams.  These Care Teams include your primary Cardiologist (physician) and Advanced Practice Providers (APPs -  Physician Assistants and Nurse Practitioners) who all work together to provide you with the care you need, when you need it. You will need a follow up appointment in 1 months with Dr Harriet Masson Any Other Special Instructions Will Be Listed Below (If Applicable).  You have a referral to Dr. Cyndia Bent at Cardi[facility]or[facility]. They will contact you regarding an appointment.

## 2018-11-14 NOTE — Progress Notes (Signed)
Cardiology Office Note:    Date:  11/14/2018   ID:  Cody Hebert, DOB [birth year], MRN [id number]  PCP:  System, Pcp Not In  Cardiologist:  No primary care provider on file.  Electrophysiologist:  None   Referring MD: No ref. provider found   Follow-up visit  History of Present Illness:    Cody Hebert is a 58 y.o. 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  During his evaluation he reported he had been experiencing left-sided chest pain for a month.  His physical exam was remarkable with a low pitched mid ejection systolic murmur which was concerning for aortic stenosis.  An echocardiogram was ordered to assess the severity of the aortic stenosis.  He was able to undergo his echocardiogram which revealed a very severe aortic stenosis.   At this time the patient is here to discuss management plan. The patient is here with his wife.   Past Medical History:  Diagnosis Date  . GERD (gastroesophageal reflux disease)   . Hyperlipidemia     Past Surgical History:  Procedure Laterality Date  . ANTERIOR CRUCIATE LIGAMENT REPAIR    . EYE SURGERY    . VASECTOMY      Current Medications: Current Meds  Medication Sig  . atorvastatin (LIPITOR) 20 MG tablet Take 1 tablet (20 mg total) by mouth daily.  Marland Kitchen loratadine (CLARITIN) 10 MG tablet Take 10 mg by mouth daily as needed for allergies.  . nitroGLYCERIN (NITROSTAT) 0.4 MG SL tablet PLACE 1 TABLET (0.4 MG TOTAL) UNDER THE TONGUE EVERY 5 (FIVE) MINUTES AS NEEDED FOR CHEST PAIN.  Marland Kitchen omeprazole (PRILOSEC) 20 MG capsule Take 20 mg by mouth daily.     Allergies:   Doxycycline   Social History   Socioeconomic History  . Marital status: Married    Spouse name: Not on file  . Number of children: Not on file  . Years of education: Not on file  . Highest education level: Not on file  Occupational History  . Not on file  Social Needs  .  Financial resource strain: Not on file  . Food insecurity    Worry: Not on file    Inability: Not on file  . Transportation needs    Medical: Not on file    Non-medical: Not on file  Tobacco Use  . Smoking status: Current Every Day Smoker    Packs/day: 2.00    Years: 40.00    Pack years: 80.00    Types: Cigarettes  . Smokeless tobacco: Never Used  Substance and Sexual Activity  . Alcohol use: Yes  . Drug use: Never  . Sexual activity: Not on file  Lifestyle  . Physical activity    Days per week: Not on file    Minutes per session: Not on file  . Stress: Not on file  Relationships  . Social Musician on phone: Not on file    Gets together: Not on file    Attends religious service: Not on file    Active member of club or organization: Not on file    Attends meetings of clubs or organizations: Not on file    Relationship status: Not on file  Other Topics Concern  . Not on file  Social History Narrative  . Not on file     Family History: The patient's family history includes Cancer in his father; Congestive Heart Failure  in his maternal grandmother; Diabetes in his brother.  ROS:   Review of Systems  Constitution: Negative for decreased appetite, fever and weight gain.  HENT: Negative for congestion, ear discharge, hoarse voice and sore throat.   Eyes: Negative for discharge, redness, vision loss in right eye and visual halos.  Cardiovascular: Negative for chest pain, dyspnea on exertion, leg swelling, orthopnea and palpitations.  Respiratory: Negative for cough, hemoptysis, shortness of breath and snoring.   Endocrine: Negative for heat intolerance and polyphagia.  Hematologic/Lymphatic: Negative for bleeding problem. Does not bruise/bleed easily.  Skin: Negative for flushing, nail changes, rash and suspicious lesions.  Musculoskeletal: Negative for arthritis, joint pain, muscle cramps, myalgias, neck pain and stiffness.  Gastrointestinal: Negative for  abdominal pain, bowel incontinence, diarrhea and excessive appetite.  Genitourinary: Negative for decreased libido, genital sores and incomplete emptying.  Neurological: Negative for brief paralysis, focal weakness, headaches and loss of balance.  Psychiatric/Behavioral: Negative for altered mental status, depression and suicidal ideas.  Allergic/Immunologic: Negative for HIV exposure and persistent infections.    EKGs/Labs/Other Studies Reviewed:    The following studies were reviewed today:   EKG:  None performed today.  TTE IMPRESSIONS   1. Left ventricular ejection fraction, by visual estimation, is 60 to 65%. The left ventricle has normal function. Normal left ventricular size. There is mildly increased left ventricular hypertrophy.  2. Left ventricular diastolic Doppler parameters are consistent with impaired relaxation pattern of LV diastolic filling.  3. Global right ventricle has normal systolic function.The right ventricular size is normal. No increase in right ventricular wall thickness.  4. Left atrial size was normal.  5. Right atrial size was normal.  6. Mild to moderate mitral annular calcification.  7. The mitral valve is normal in structure. No evidence of mitral valve regurgitation. No evidence of mitral stenosis.  8. The tricuspid valve is normal in structure. Tricuspid valve regurgitation was not visualized by color flow Doppler.  9. The aortic valve is bicuspid Aortic valve regurgitation is mild by color flow Doppler. Severe aortic valve stenosis. 10. The pulmonic valve was normal in structure. Pulmonic valve regurgitation is not visualized by color flow Doppler. 11. There is mild to moderate dilatation of the ascending aorta measuring 44 mm. 12. Moderately elevated pulmonary artery systolic pressure. 13. The inferior vena cava is normal in size with greater than 50% respiratory variability, suggesting right atrial pressure of 3 mmHg.   Recent Labs: 11/07/2018: BUN  13; Creatinine, Ser 0.84; Magnesium 1.9; Potassium 4.7; Sodium 142  Recent Lipid Panel    Component Value Date/Time   CHOL 213 (H) 11/07/2018 0901   TRIG 111 11/07/2018 0901   HDL 45 11/07/2018 0901   CHOLHDL 4.7 11/07/2018 0901   LDLCALC 148 (H) 11/07/2018 0901    Physical Exam:    VS:  BP 100/86 (BP Location: Right Arm, Patient Position: Sitting, Cuff Size: Normal)   Pulse 77   Ht 5\' 9"  (1.753 m)   Wt 189 lb (85.7 kg)   SpO2 96%   BMI 27.91 kg/m     Wt Readings from Last 3 Encounters:  11/14/18 189 lb (85.7 kg)  11/07/18 190 lb 6.4 oz (86.4 kg)     GEN: Well nourished, well developed in no acute distress HEENT: Normal NECK: No JVD; No carotid bruits LYMPHATICS: No lymphadenopathy CARDIAC: S1S2 noted,RRR,  II/VI midsystolic ejection murmurs, rubs, gallops RESPIRATORY:  Clear to auscultation without rales, wheezing or rhonchi  ABDOMEN: Soft, non-tender, non-distended, +bowel sounds, no guarding. EXTREMITIES:  No edema, No cyanosis, no clubbing MUSCULOSKELETAL:  No edema; No deformity  SKIN: Warm and dry NEUROLOGIC:  Alert and oriented x 3, non-focal PSYCHIATRIC:  Normal affect, good insight  ASSESSMENT:    1. Severe aortic valve stenosis   2. Dyslipidemia   3. Bicuspid aortic valve   4. Pre-procedure lab exam    PLAN:    1. Very Severe Aortic Stenosis - his echo reports AVA by VTI 0.50 cm, mean gradient 72.2 mmHG. He needs a valve replacement.  I have educated patient on the severity of his condition. He has been referred to CTS. He has also been schedule for a left heart catheterization to assess his coronaries arteries in planning of his upcoming procedure.    2. His lab work showed evidence of dyslipidemia and he was started on atorvastatin 20 mg to his visit.  He reports he has been taking it since.  3.  He was also started on aspirin 81 mg today.   4. 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 He was advised that if he has any  worsening chest pain, shortness of breath or any syncope episodes to go to the nearest emergency department. All of their questions to their satisfaction.  I personally reviewed outside records for today's encounter.  This included review of historical hospital records, office notes, cardiac studies (e.g. Echocardiograms, Cardiac Catheterizations for upcoming AVR.  The pertinent findings are outlined in my note.  The total non-face to face time spent for record review was 35 minutes.  The patient and his wife is in agreement with the above plan. The patient left the office in stable condition.  The patient will follow up in 1 month.     Medication Adjustments/Labs and Tests Ordered: Current medicines are reviewed at length with the patient today.  Concerns regarding medicines are outlined above.  Orders Placed This Encounter  Procedures  . Basic Metabolic Panel (BMET)  . CBC  . Ambulatory referral to Cardiothoracic Surgery   Meds ordered this encounter  Medications  . aspirin EC 81 MG tablet    Sig: Take 1 tablet (81 mg total) by mouth daily.    Dispense:  90 tablet    Refill:  3    Patient Instructions  Medication Instructions:  Your physician has recommended you make the following change in your medication:   START: Aspirin 81 mg Take 1 tab daily   If you need a refill on your cardiac medications before your next appointment, please call your pharmacy.   Lab work: Your physician recommends that you return for lab work in: TODAY CBC,BMP  If you have labs (blood work) drawn today and your tests are completely normal, you will receive your results only by: Marland Kitchen MyChart Message (if you have MyChart) OR . A paper copy in the mail If you have any lab test that is abnormal or we need to change your treatment, we will call you to review the results.  Testing/Procedures:   You have a COVID 19 screening appointment [address] [place]. Friday Oct 2,2020 at 10:15AMamb     [facility] CARDIOVASCULAR DIVISION [facility] AT [facility] [address] [place] [address] Dept: [phone] Loc: [phone]  Markeem Noreen  11/14/2018  You are scheduled for a Cardiac Catheterization on Tuesday, October 6 with Dr. Lance Muss.  1. Please arrive at the [facility] (Main Entrance A) at [facility]: [address] [place], [place] [address] at 5:30 AM (This time is two hours before your procedure to ensure your preparation). Free valet parking service is available.   Special note: Every effort is made to have your procedure done on time. Please understand that emergencies sometimes delay scheduled procedures.  2. Diet: Do not eat solid foods after midnight.  The patient may have clear liquids until 5am upon the day of the procedure.  3. Labs: None needed 4. Medication instructions in preparation for your procedure:  On the morning of your procedure, take your Aspirin and any morning medicines NOT listed above.  You may use sips of water.  5. Plan for one night stay--bring personal belongings. 6. Bring a current list of your medications and current insurance cards. 7. You MUST have a responsible person to drive you home. 8. Someone MUST be with you the first 24 hours after you arrive home or your discharge will be delayed. 9. Please wear clothes that are easy to get on and off and wear slip-on shoes.  Thank you for allowing [place] to care for you!   -- [facility]   Follow-Up: At [facility], you and your health needs are our priority.  As part of our continuing mission to provide you with exceptional heart care, we have created designated Provider Care Teams.  These Care Teams include your primary Cardiologist (physician) and Advanced Practice Providers (APPs -  Physician Assistants and Nurse Practitioners) who all work together to provide you with the care you need, when you need  it. You will need a follow up appointment in 1 months with Dr Servando Salina Any Other Special Instructions Will Be Listed Below (If Applicable).  You have a referral to Dr. Laneta Simmers at Cardiothoracic Surgery. They will contact you regarding an appointment.       Aortic Valve Stenosis   Aortic valve stenosis is a narrowing of the aortic valve in the heart. The aortic valve opens and closes to regulate blood flow between the left side of the heart (left ventricle) and the artery that leads away from the heart (aorta). When the aortic valve becomes narrow, it is difficult for the heart to pump blood out to the body, which causes the heart to work harder. The extra work can weaken the heart muscle over time. Aortic valve stenosis can range from mild to severe. If it is not treated, it can become more severe over time and lead to heart failure. What are the causes? This condition may be caused by:  Buildup of calcium around and on the aortic valve. This can occur with aging. This is the most common cause of aortic valve stenosis.  A heart problem that developed in the womb (birth defect).  Rheumatic fever.  Radiation to the chest. What increases the risk? You may be more likely to develop this condition if:  You are older than age 36.  You were born with an abnormal bicuspid valve. What are the signs or symptoms? You may not have any symptoms until your condition becomes severe. It may take 10-20 years for mild or moderate aortic valve stenosis to become severe. Symptoms may include:  Shortness of breath. This may get worse during physical activity.  Feeling unusually weak and tired (fatigue).  Extreme discomfort in the chest, neck, or arm during physical activity (angina).  A heartbeat that is irregular or faster than normal (palpitations).  Dizziness or fainting. This may happen when you get physically tired or  after you take certain heart medicines, such as nitroglycerin. How is this  diagnosed? This condition may be diagnosed with:  A physical exam.  Echocardiogram. This is a type of imaging test that uses sound waves (ultrasound) to make images of your heart. There are two kinds of this test that may be used. ? Transthoracic echocardiogram (TTE). For this type, a wand-like tool (transducer) is moved over your chest to create ultrasound images that are recorded by a computer. ? Transesophageal echocardiogram (TEE). For this type, a flexible tube (probe) is inserted down the part of the body that moves food from your mouth to your stomach (esophagus). The heart and the esophagus are close to each other. Your health care provider will use the probe to take clear, detailed pictures of the heart.  Cardiac catheterization. For this procedure, a small, thin tube (catheter) is passed through a large vein in your neck, groin, or arm. The catheter is used to get information about arteries, structures, blood pressure, and oxygen levels in your heart.  Stress tests. These are tests that evaluate the blood supply to your heart and your heart's response to exercise. You may work with a health care provider who specializes in the heart (cardiologist) for diagnosis and treatment. How is this treated? Treatment depends on how severe your condition is and what your symptoms are. You will need to have your heart checked regularly to make sure that your condition is not getting worse or causing serious problems. Treatment may also include:  Surgery to replace your aortic valve. This is the most common treatment for aortic valve stenosis, and it is the only treatment to cure the condition. Several types of surgeries are available. The surgery may be done: ? Through a large incision over your heart (open-heart surgery). ? Through small incisions, using a flexible tube called a catheter (transcatheter aortic valve replacement, TAVR).  Medicines that help to keep your heart rate regular.   Medicines that thin your blood (anticoagulants) to prevent blood clots.  Antibiotic medicines to help prevent infection. If your condition is mild, you may only need regular follow-up visits for monitoring. Follow these instructions at home: Lifestyle  Limit alcohol intake to no more than 1 drink a day for nonpregnant women and 2 drinks a day for men. One drink equals 12 oz of beer, 5 oz of wine, or 1 oz of hard liquor.  Do not use any products that contain nicotine or tobacco, such as cigarettes and e-cigarettes. If you need help quitting, ask your health care provider.  Work with your health care provider to manage your blood pressure and cholesterol.  Maintain a healthy weight. Eating and drinking    Eat a heart-healthy diet that includes plenty of fresh fruits and vegetables, whole grains, lean protein, and low-fat or nonfat dairy.  Limit how much caffeine you drink. Caffeine can affect your heart's rate and rhythm.  Avoid foods that are: ? High in salt (sodium), saturated fat, or sugar. ? Canned or highly processed. ? Fried.  Follow instructions from your health care provider about any other eating or drinking restrictions. Activity  Exercise regularly and return to your normal activities as told by your health care provider. Ask your health care provider what amount and type of physical activity is safe for you. ? If your aortic valve stenosis is mild, you may only need to avoid very intense physical activity, such as heavy weight lifting. ? The more severe your aortic valve stenosis is, the  more activities you may need to avoid. If you are taking blood thinners:  Before you take any medicines that contain aspirin or NSAIDs, talk with your health care provider. These medicines increase your risk for dangerous bleeding.  Take your medicine exactly as told, at the same time every day.  Avoid activities that could cause injury or bruising, and follow instructions about how  to prevent falls.  Wear a medical alert bracelet or carry a card that lists what medicines you take. General instructions  Take over-the-counter and prescription medicines only as told by your health care provider.  If you were prescribed an antibiotic, take it as told by your health care provider. Do not stop taking the antibiotic even if you start to feel better.  If you are a woman and you plan to become pregnant, talk with your health care provider before you become pregnant.  Before you have any type of medical or dental procedure or surgery, tell all health care providers that you have aortic valve stenosis. This may affect the treatment that you receive.  Keep all follow-up visits as told by your health care provider. This is important. Contact a health care provider if:  You have a fever. Get help right away if:  You develop any of the following symptoms: ? Chest pain. ? Chest tightness. ? Shortness of breath. ? Trouble breathing.  You feel light-headed.  You feel like you might faint.  Your heartbeat is irregular or faster than normal. These symptoms may represent a serious problem that is an emergency. Do not wait to see if the symptoms will go away. Get medical help right away. Call your local emergency services (911 in the [place] Do not drive yourself to the hospital. Summary  Aortic valve stenosis is a narrowing of the aortic valve in the heart. The aortic valve opens and closes to regulate blood flow between the left side of the heart (left ventricle) and the artery that leads away from the heart (aorta).  Aortic valve stenosis can range from mild to severe. If it is not treated, it can become more severe over time and lead to heart failure.  Treatment depends on how severe your condition is and what your symptoms are. You will need to have your heart checked regularly to make sure that your condition is not getting worse or causing serious problems.  Exercise  regularly and return to your normal activities as told by your health care provider. Ask your health care provider what amount and type of physical activity is safe for you. This information is not intended to replace advice given to you by your health care provider. Make sure you discuss any questions you have with your health care provider. Document Released: 11/01/2002 Document Revised: 01/15/2017 Document Reviewed: 11/05/2016 Elsevier Patient Education  2020 [employer].     Adopting a Healthy Lifestyle.  Know what a healthy weight is for you (roughly BMI <25) and aim to maintain this   Aim for 7+ servings of fruits and vegetables daily   65-80+ fluid ounces of water or unsweet tea for healthy kidneys   Limit to max 1 drink of alcohol per day; avoid smoking/tobacco   Limit animal fats in diet for cholesterol and heart health - choose grass fed whenever available   Avoid highly processed foods, and foods high in saturated/trans fats   Aim for low stress - take time to unwind and care for your mental health   Aim for 150 min  of moderate intensity exercise weekly for heart health, and weights twice weekly for bone health   Aim for 7-9 hours of sleep daily   When it comes to diets, agreement about the perfect plan isnt easy to find, even among the experts. Experts at the [facility] of [employer] developed an idea known as the Healthy Eating Plate. Just imagine a plate divided into logical, healthy portions.   The emphasis is on diet quality:   Load up on vegetables and fruits - one-half of your plate: Aim for color and variety, and remember that potatoes dont count.   Go for whole grains - one-quarter of your plate: Whole wheat, barley, wheat berries, quinoa, oats, brown rice, and foods made with them. If you want pasta, go with whole wheat pasta.   Protein power - one-quarter of your plate: Fish, chicken, beans, and nuts are all healthy, versatile protein sources.  Limit red meat.   The diet, however, does go beyond the plate, offering a few other suggestions.   Use healthy plant oils, such as olive, canola, soy, corn, sunflower and peanut. Check the labels, and avoid partially hydrogenated oil, which have unhealthy trans fats.   If youre thirsty, drink water. Coffee and tea are good in moderation, but skip sugary drinks and limit milk and dairy products to one or two daily servings.   The type of carbohydrate in the diet is more important than the amount. Some sources of carbohydrates, such as vegetables, fruits, whole grains, and beans-are healthier than others.   Finally, stay active  Signed, Thomasene Ripple, DO  11/14/2018 4:18 PM    [facility]

## 2018-11-15 LAB — BASIC METABOLIC PANEL
BUN/Creatinine Ratio: 14 (ref 9–20)
BUN: 12 mg/dL (ref 6–24)
CO2: 22 mmol/L (ref 20–29)
Calcium: 9.8 mg/dL (ref 8.7–10.2)
Chloride: 98 mmol/L (ref 96–106)
Creatinine, Ser: 0.84 mg/dL (ref 0.76–1.27)
GFR calc Af Amer: 113 mL/min/{1.73_m2} (ref >59)
GFR calc non Af Amer: 98 mL/min/{1.73_m2} (ref >59)
Glucose: 93 mg/dL (ref 65–99)
Potassium: 4.5 mmol/L (ref 3.5–5.2)
Sodium: 136 mmol/L (ref 134–144)

## 2018-11-15 LAB — CBC
Hematocrit: 47.4 % (ref 37.5–51.0)
Hemoglobin: 15.9 g/dL (ref 13.0–17.7)
MCH: 30.5 pg (ref 26.6–33.0)
MCHC: 33.5 g/dL (ref 31.5–35.7)
MCV: 91 fL (ref 79–97)
Platelets: 226 10*3/uL (ref 150–450)
RBC: 5.22 x10E6/uL (ref 4.14–5.80)
RDW: 12.5 % (ref 11.6–15.4)
WBC: 9.1 10*3/uL (ref 3.4–10.8)

## 2018-11-18 ENCOUNTER — Other Ambulatory Visit (HOSPITAL_COMMUNITY)
Admission: RE | Admit: 2018-11-18 | Discharge: 2018-11-18 | Disposition: A | Discharge status: Home / Self Care | Payer: 59 | Source: Ambulatory Visit | Attending: Interventional Cardiology | Admitting: Interventional Cardiology

## 2018-11-18 DIAGNOSIS — Z20828 Contact with and (suspected) exposure to other viral communicable diseases: Secondary | ICD-10-CM | POA: Insufficient documentation

## 2018-11-18 DIAGNOSIS — Z01812 Encounter for preprocedural laboratory examination: Secondary | ICD-10-CM | POA: Diagnosis not present

## 2018-11-20 LAB — NOVEL CORONAVIRUS, NAA (HOSP ORDER, SEND-OUT TO REF LAB; TAT 18-24 HRS): SARS-CoV-2, NAA: NOT DETECTED

## 2018-11-21 ENCOUNTER — Telehealth: Payer: Self-pay | Admitting: *Deleted

## 2018-11-21 NOTE — Telephone Encounter (Signed)
Pt contacted pre-catheterization scheduled at [facility] for: Tuesday November 22, 2018 7:30 AM Verified arrival time and place: [facility]) at: 5:30 AM   No solid food after midnight prior to cath, clear liquids until 5 AM day of procedure. Contrast allergy: no  AM meds can be  taken pre-cath with sip of water including: ASA 81 mg   Confirmed patient has responsible adult to drive home post procedure and observe 24 hours after arriving home: yes  Currently, due to Covid-19 pandemic, only one support person will be allowed with patient. Must be the same support person for that patient's entire stay, will be screened and required to wear a mask. They will be asked to wait in the waiting room for the duration of the patient's stay.  Patients are required to wear a mask when they enter the hospital.      COVID-19 Pre-Screening Questions:  . In the past 7 to 10 days have you had a cough,  shortness of breath, headache, congestion, fever (100 or greater) body aches, chills, sore throat, or sudden loss of taste or sense of smell? no . Have you been around anyone with known Covid 19? no . Have you been around anyone who is awaiting Covid 19 test results in the past 7 to 10 days? no . Have you been around anyone who has been exposed to Covid 19, or has mentioned symptoms of Covid 19 within the past 7 to 10 days? no   I reviewed procedure/mask/visitor, Covid-19 screening questions with patient, he verbalized understanding, thanked me for call.

## 2018-11-21 NOTE — Telephone Encounter (Signed)
Per Dr Harriet Masson- can do the right heart as well. For the [facility] he does not need to cross the valve though as we do have good gradients.

## 2018-11-22 ENCOUNTER — Ambulatory Visit (HOSPITAL_COMMUNITY)
Admission: RE | Admit: 2018-11-22 | Discharge: 2018-11-22 | Disposition: A | Discharge status: Home / Self Care | Payer: 59 | Attending: Interventional Cardiology | Admitting: Interventional Cardiology

## 2018-11-22 ENCOUNTER — Other Ambulatory Visit: Payer: Self-pay

## 2018-11-22 ENCOUNTER — Encounter (HOSPITAL_COMMUNITY): Payer: Self-pay | Admitting: Interventional Cardiology

## 2018-11-22 ENCOUNTER — Encounter (HOSPITAL_COMMUNITY)
Admission: RE | Disposition: A | Discharge status: Home / Self Care | Payer: Self-pay | Source: Home / Self Care | Attending: Interventional Cardiology

## 2018-11-22 DIAGNOSIS — E785 Hyperlipidemia, unspecified: Secondary | ICD-10-CM | POA: Diagnosis not present

## 2018-11-22 DIAGNOSIS — F17210 Nicotine dependence, cigarettes, uncomplicated: Secondary | ICD-10-CM | POA: Diagnosis not present

## 2018-11-22 DIAGNOSIS — Z79899 Other long term (current) drug therapy: Secondary | ICD-10-CM | POA: Insufficient documentation

## 2018-11-22 DIAGNOSIS — K219 Gastro-esophageal reflux disease without esophagitis: Secondary | ICD-10-CM | POA: Insufficient documentation

## 2018-11-22 DIAGNOSIS — I350 Nonrheumatic aortic (valve) stenosis: Secondary | ICD-10-CM | POA: Insufficient documentation

## 2018-11-22 HISTORY — PX: RIGHT/LEFT HEART CATH AND CORONARY ANGIOGRAPHY: CATH118266

## 2018-11-22 LAB — POCT I-STAT 7, (LYTES, BLD GAS, ICA,H+H)
Acid-Base Excess: 3 mmol/L — ABNORMAL HIGH (ref 0.0–2.0)
Bicarbonate: 28.6 mmol/L — ABNORMAL HIGH (ref 20.0–28.0)
Calcium, Ion: 1.26 mmol/L (ref 1.15–1.40)
HCT: 43 % (ref 39.0–52.0)
Hemoglobin: 14.6 g/dL (ref 13.0–17.0)
O2 Saturation: 98 %
Potassium: 4 mmol/L (ref 3.5–5.1)
Sodium: 139 mmol/L (ref 135–145)
TCO2: 30 mmol/L (ref 22–32)
pCO2 arterial: 48.4 mmHg — ABNORMAL HIGH (ref 32.0–48.0)
pH, Arterial: 7.38 (ref 7.350–7.450)
pO2, Arterial: 108 mmHg (ref 83.0–108.0)

## 2018-11-22 LAB — POCT I-STAT EG7
Acid-Base Excess: 1 mmol/L (ref 0.0–2.0)
Bicarbonate: 27.4 mmol/L (ref 20.0–28.0)
Calcium, Ion: 1.27 mmol/L (ref 1.15–1.40)
HCT: 43 % (ref 39.0–52.0)
Hemoglobin: 14.6 g/dL (ref 13.0–17.0)
O2 Saturation: 72 %
Potassium: 3.9 mmol/L (ref 3.5–5.1)
Sodium: 139 mmol/L (ref 135–145)
TCO2: 29 mmol/L (ref 22–32)
pCO2, Ven: 49.3 mmHg (ref 44.0–60.0)
pH, Ven: 7.354 (ref 7.250–7.430)
pO2, Ven: 40 mmHg (ref 32.0–45.0)

## 2018-11-22 LAB — POCT ACTIVATED CLOTTING TIME: Activated Clotting Time: 125 seconds

## 2018-11-22 SURGERY — RIGHT/LEFT HEART CATH AND CORONARY ANGIOGRAPHY
Anesthesia: LOCAL

## 2018-11-22 MED ORDER — SODIUM CHLORIDE 0.9% FLUSH: 3.0000 mL | Freq: Two times a day (BID) | INTRAVENOUS | Status: DC

## 2018-11-22 MED ORDER — SODIUM CHLORIDE 0.9% FLUSH: 3.0000 mL | INTRAVENOUS | Status: DC | PRN

## 2018-11-22 MED ORDER — LIDOCAINE HCL (PF) 1 % IJ SOLN
INTRAMUSCULAR | Status: DC | PRN
  Administered 2018-11-22 (×2): 2 mL

## 2018-11-22 MED ORDER — SODIUM CHLORIDE 0.9 % WEIGHT BASED INFUSION: 1.0000 mL/kg/h | INTRAVENOUS | Status: DC

## 2018-11-22 MED ORDER — VERAPAMIL HCL 2.5 MG/ML IV SOLN
INTRAVENOUS | Status: DC | PRN
  Administered 2018-11-22: 10 mL via INTRA_ARTERIAL

## 2018-11-22 MED ORDER — SODIUM CHLORIDE 0.9 % IV SOLN: 250.0000 mL | INTRAVENOUS | Status: DC | PRN

## 2018-11-22 MED ORDER — VERAPAMIL HCL 2.5 MG/ML IV SOLN
INTRAVENOUS | Status: AC
  Filled 2018-11-22: qty 2

## 2018-11-22 MED ORDER — MIDAZOLAM HCL 2 MG/2ML IJ SOLN
INTRAMUSCULAR | Status: AC
  Filled 2018-11-22: qty 2

## 2018-11-22 MED ORDER — FENTANYL CITRATE (PF) 100 MCG/2ML IJ SOLN
INTRAMUSCULAR | Status: AC
  Filled 2018-11-22: qty 2

## 2018-11-22 MED ORDER — ACETAMINOPHEN 325 MG PO TABS: 650.0000 mg | ORAL_TABLET | ORAL | Status: DC | PRN

## 2018-11-22 MED ORDER — HYDRALAZINE HCL 20 MG/ML IJ SOLN: 10.0000 mg | INTRAMUSCULAR | Status: DC | PRN

## 2018-11-22 MED ORDER — LIDOCAINE HCL (PF) 1 % IJ SOLN
INTRAMUSCULAR | Status: AC
  Filled 2018-11-22: qty 30

## 2018-11-22 MED ORDER — MIDAZOLAM HCL 2 MG/2ML IJ SOLN
INTRAMUSCULAR | Status: DC | PRN
  Administered 2018-11-22: 2 mg via INTRAVENOUS

## 2018-11-22 MED ORDER — FENTANYL CITRATE (PF) 100 MCG/2ML IJ SOLN
INTRAMUSCULAR | Status: DC | PRN
  Administered 2018-11-22: 25 ug via INTRAVENOUS

## 2018-11-22 MED ORDER — ONDANSETRON HCL 4 MG/2ML IJ SOLN: 4.0000 mg | Freq: Four times a day (QID) | INTRAMUSCULAR | Status: DC | PRN

## 2018-11-22 MED ORDER — SODIUM CHLORIDE 0.9 % WEIGHT BASED INFUSION
3.0000 mL/kg/h | INTRAVENOUS | Status: AC
  Administered 2018-11-22: 06:00:00 3 mL/kg/h via INTRAVENOUS

## 2018-11-22 MED ORDER — HEPARIN SODIUM (PORCINE) 1000 UNIT/ML IJ SOLN
INTRAMUSCULAR | Status: DC | PRN
  Administered 2018-11-22: 4500 [IU] via INTRAVENOUS

## 2018-11-22 MED ORDER — HEPARIN (PORCINE) IN NACL 1000-0.9 UT/500ML-% IV SOLN
INTRAVENOUS | Status: AC
  Filled 2018-11-22: qty 1000

## 2018-11-22 MED ORDER — LABETALOL HCL 5 MG/ML IV SOLN: 10.0000 mg | INTRAVENOUS | Status: DC | PRN

## 2018-11-22 MED ORDER — HEPARIN (PORCINE) IN NACL 1000-0.9 UT/500ML-% IV SOLN
INTRAVENOUS | Status: DC | PRN
  Administered 2018-11-22 (×2): 500 mL

## 2018-11-22 MED ORDER — IOHEXOL 350 MG/ML SOLN
INTRAVENOUS | Status: DC | PRN
  Administered 2018-11-22: 55 mL via INTRA_ARTERIAL

## 2018-11-22 SURGICAL SUPPLY — 11 items

## 2018-11-22 NOTE — Interval H&P Note (Signed)
History and Physical Interval Note:  11/22/2018 7:38 AM  Cody Hebert  has presented today for surgery, with the diagnosis of arotic stenosis.  The various methods of treatment have been discussed with the patient and family. After consideration of risks, benefits and other options for treatment, the patient has consented to  Procedure(s): RIGHT/LEFT HEART CATH AND CORONARY ANGIOGRAPHY (N/A) as a surgical intervention.  The patient's history has been reviewed, patient examined, no change in status, stable for surgery.  I have reviewed the patient's chart and labs.  Questions were answered to the patient's satisfaction.    Planning diagnostic only, for severe AS.  Larae Grooms

## 2018-11-22 NOTE — Discharge Instructions (Signed)
Radial Site Care ° °This sheet gives you information about how to care for yourself after your procedure. Your health care provider may also give you more specific instructions. If you have problems or questions, contact your health care provider. °What can I expect after the procedure? °After the procedure, it is common to have: °· Bruising and tenderness at the catheter insertion area. °Follow these instructions at home: °Medicines °· Take over-the-counter and prescription medicines only as told by your health care provider. °Insertion site care °· Follow instructions from your health care provider about how to take care of your insertion site. Make sure you: °? Wash your hands with soap and water before you change your bandage (dressing). If soap and water are not available, use hand sanitizer. °? Change your dressing as told by your health care provider. °? Leave stitches (sutures), skin glue, or adhesive strips in place. These skin closures may need to stay in place for 2 weeks or longer. If adhesive strip edges start to loosen and curl up, you may trim the loose edges. Do not remove adhesive strips completely unless your health care provider tells you to do that. °· Check your insertion site every day for signs of infection. Check for: °? Redness, swelling, or pain. °? Fluid or blood. °? Pus or a bad smell. °? Warmth. °· Do not take baths, swim, or use a hot tub until your health care provider approves. °· You may shower 24-48 hours after the procedure, or as directed by your health care provider. °? Remove the dressing and gently wash the site with plain soap and water. °? Pat the area dry with a clean towel. °? Do not rub the site. That could cause bleeding. °· Do not apply powder or lotion to the site. °Activity ° °· For 24 hours after the procedure, or as directed by your health care provider: °? Do not flex or bend the affected arm. °? Do not push or pull heavy objects with the affected arm. °? Do not  drive yourself home from the hospital or clinic. You may drive 24 hours after the procedure unless your health care provider tells you not to. °? Do not operate machinery or power tools. °· Do not lift anything that is heavier than 10 lb (4.5 kg), or the limit that you are told, until your health care provider says that it is safe. °· Ask your health care provider when it is okay to: °? Return to work or school. °? Resume usual physical activities or sports. °? Resume sexual activity. °General instructions °· If the catheter site starts to bleed, raise your arm and put firm pressure on the site. If the bleeding does not stop, get help right away. This is a medical emergency. °· If you went home on the same day as your procedure, a responsible adult should be with you for the first 24 hours after you arrive home. °· Keep all follow-up visits as told by your health care provider. This is important. °Contact a health care provider if: °· You have a fever. °· You have redness, swelling, or yellow drainage around your insertion site. °Get help right away if: °· You have unusual pain at the radial site. °· The catheter insertion area swells very fast. °· The insertion area is bleeding, and the bleeding does not stop when you hold steady pressure on the area. °· Your arm or hand becomes pale, cool, tingly, or numb. °These symptoms may represent a serious problem   that is an emergency. Do not wait to see if the symptoms will go away. Get medical help right away. Call your local emergency services (911 in the [place] Do not drive yourself to the hospital. °Summary °· After the procedure, it is common to have bruising and tenderness at the site. °· Follow instructions from your health care provider about how to take care of your radial site wound. Check the wound every day for signs of infection. °· Do not lift anything that is heavier than 10 lb (4.5 kg), or the limit that you are told, until your health care provider says  that it is safe. °This information is not intended to replace advice given to you by your health care provider. Make sure you discuss any questions you have with your health care provider. °Document Released: 03/07/2010 Document Revised: 03/10/2017 Document Reviewed: 03/10/2017 °Elsevier Patient Education © 2020 [employer] ° °

## 2018-11-23 ENCOUNTER — Institutional Professional Consult (permissible substitution): Payer: 59 | Admitting: Surgery

## 2018-11-23 ENCOUNTER — Encounter: Payer: Self-pay | Admitting: *Deleted

## 2018-11-23 ENCOUNTER — Encounter: Payer: Self-pay | Admitting: Surgery

## 2018-11-23 ENCOUNTER — Other Ambulatory Visit: Payer: Self-pay | Admitting: Surgery

## 2018-11-23 ENCOUNTER — Other Ambulatory Visit: Payer: Self-pay | Admitting: *Deleted

## 2018-11-23 VITALS — BP 120/80 | HR 82 | Temp 97.7°F | Resp 18 | Ht 69.0 in | Wt 192.0 lb

## 2018-11-23 DIAGNOSIS — I350 Nonrheumatic aortic (valve) stenosis: Secondary | ICD-10-CM

## 2018-11-23 NOTE — Progress Notes (Signed)
Cardiothoracic Surgery Consultation   PCP is System, Pcp Not In Referring Provider is Thomasene Ripple, DO  Chief Complaint  Patient presents with   Aortic Stenosis    Surgical eval, Cardiac Cath 11/22/18, ECHO 11/10/18    HPI:  The patient is a 58 year old gentleman with a history of hyperlipidemia and ongoing 1-2 ppd smoking who was diagnosed with bicuspid aortic valve stenosis several years ago according to him when he was noted to have a heart murmur and had an echo done by Dr. Dulce Sellar. He says that he did not follow up since that. He now presented for a DOT physical and reported left-sided chest pain for the past month,  exertional shortness of breath and fatigue for the past year. He denies any dizziness or syncope, no peripheral edema, orthopnea or PND. He had an echo done on 11/10/18 showing critical AS with a bicuspid AV, mean gradient of 72.2 mm Hg and peak of 123.5 mm Hg, AVA 0.5 cm2. LVEF was normal. Aorta was ectatic with measurement of 3.3 cm at root and 4.4 cm ascending.  He is here with his wife today. He works as a heavy load Naval architect.   Past Medical History:  Diagnosis Date   GERD (gastroesophageal reflux disease)    Hyperlipidemia     Past Surgical History:  Procedure Laterality Date   ANTERIOR CRUCIATE LIGAMENT REPAIR     EYE SURGERY     RIGHT/LEFT HEART CATH AND CORONARY ANGIOGRAPHY N/A 11/22/2018   Procedure: RIGHT/LEFT HEART CATH AND CORONARY ANGIOGRAPHY;  Surgeon: Corky Crafts, MD;  Location: [facility] INVASIVE CV LAB;  Service: Cardiovascular;  Laterality: N/A;   VASECTOMY      Family History  Problem Relation Age of Onset   Cancer Father    Diabetes Brother    Congestive Heart Failure Maternal Grandmother     Social History Social History   Tobacco Use   Smoking status: Current Every Day Smoker    Packs/day: 2.00    Years: 40.00    Pack years: 80.00    Types: Cigarettes   Smokeless tobacco: Never Used  Substance Use Topics    Alcohol use: Yes   Drug use: Never    Current Outpatient Medications  Medication Sig Dispense Refill   aspirin EC 81 MG tablet Take 1 tablet (81 mg total) by mouth daily. 90 tablet 3   Aspirin-Acetaminophen-Caffeine (GOODYS EXTRA STRENGTH) 520-260-32.5 MG PACK Take 1 packet by mouth daily as needed (headaches.).     atorvastatin (LIPITOR) 20 MG tablet Take 1 tablet (20 mg total) by mouth daily. (Patient taking differently: Take 20 mg by mouth at bedtime. ) 90 tablet 1   Coenzyme Q10 (COQ10) 100 MG CAPS Take 100 mg by mouth at bedtime.     loratadine (CLARITIN) 10 MG tablet Take 10 mg by mouth daily as needed for allergies.     naproxen sodium (ALEVE) 220 MG tablet Take 440 mg by mouth 2 (two) times daily as needed (pain.).     nitroGLYCERIN (NITROSTAT) 0.4 MG SL tablet Place 0.4 mg under the tongue every 5 (five) minutes x 3 doses as needed for chest pain.      omeprazole (PRILOSEC) 20 MG capsule Take 20 mg by mouth daily.     No current facility-administered medications for this visit.     Allergies  Allergen Reactions   Doxycycline Nausea And Vomiting    Review of Systems  Constitutional: Positive for activity change and fatigue.  HENT:  Full dentures  Eyes: Negative.   Respiratory: Positive for cough, chest tightness, shortness of breath and wheezing.   Cardiovascular: Positive for chest pain. Negative for palpitations and leg swelling.  Gastrointestinal: Positive for diarrhea.       Reflux  Endocrine: Negative.   Genitourinary: Negative.   Musculoskeletal: Negative.   Skin: Positive for rash.  Allergic/Immunologic: Negative.   Neurological: Negative for dizziness, syncope, weakness and headaches.  Hematological: Negative.   Psychiatric/Behavioral: Negative.     BP 120/80 (BP Location: Right Arm, Patient Position: Sitting, Cuff Size: Normal)    Pulse 82    Temp 97.7 F (36.5 C)    Resp 18    Ht 5\' 9"  (1.753 m)    Wt 192 lb (87.1 kg)    SpO2 98% Comment: RA    BMI 28.35 kg/m  Physical Exam Constitutional:      Appearance: Normal appearance. He is normal weight.  HENT:     Head: Normocephalic and atraumatic.     Mouth/Throat:     Mouth: Mucous membranes are moist.     Pharynx: Oropharynx is clear.  Eyes:     Extraocular Movements: Extraocular movements intact.     Pupils: Pupils are equal, round, and reactive to light.  Neck:     Musculoskeletal: Normal range of motion and neck supple.  Cardiovascular:     Rate and Rhythm: Normal rate and regular rhythm.     Pulses: Normal pulses.     Heart sounds: Murmur present.     Comments: 3/6 systolic murmur RSB Pulmonary:     Effort: Pulmonary effort is normal.     Breath sounds: Normal breath sounds.  Abdominal:     General: Abdomen is flat. Bowel sounds are normal.     Palpations: Abdomen is soft.  Musculoskeletal: Normal range of motion.        General: No swelling.  Skin:    General: Skin is warm and dry.  Neurological:     General: No focal deficit present.     Mental Status: He is alert and oriented to person, place, and time.  Psychiatric:        Mood and Affect: Mood normal.        Behavior: Behavior normal.        Thought Content: Thought content normal.      Diagnostic Tests:  Result status: Final result    ECHOCARDIOGRAM REPORT       Patient Name:   Cody Hebert Date of Exam: 11/10/2018 Medical Rec #:  [id number]    Height:       69.0 in Accession #:    [id number]   Weight:       190.4 lb Date of Birth:  [birth year]    BSA:          2.02 m Patient Age:    58 years     BP:           126/88 mmHg Patient Gender: M            HR:           66 bpm. Exam Location:  [place]  Procedure: 2D Echo, Cardiac Doppler and Color Doppler  Indications:    Murmur   History:        Patient has no prior history of Echocardiogram examinations.                 Aortic Valve Disease Signs/Symptoms:Chest Pain Risk  Factors:Dyslipidemia.   Sonographer:    Sinda Du RDCS (AE) Referring Phys: [id number] KARDIE TOBB  IMPRESSIONS    1. Left ventricular ejection fraction, by visual estimation, is 60 to 65%. The left ventricle has normal function. Normal left ventricular size. There is mildly increased left ventricular hypertrophy.  2. Left ventricular diastolic Doppler parameters are consistent with impaired relaxation pattern of LV diastolic filling.  3. Global right ventricle has normal systolic function.The right ventricular size is normal. No increase in right ventricular wall thickness.  4. Left atrial size was normal.  5. Right atrial size was normal.  6. Mild to moderate mitral annular calcification.  7. The mitral valve is normal in structure. No evidence of mitral valve regurgitation. No evidence of mitral stenosis.  8. The tricuspid valve is normal in structure. Tricuspid valve regurgitation was not visualized by color flow Doppler.  9. The aortic valve is bicuspid Aortic valve regurgitation is mild by color flow Doppler. Severe aortic valve stenosis. 10. The pulmonic valve was normal in structure. Pulmonic valve regurgitation is not visualized by color flow Doppler. 11. There is mild to moderate dilatation of the ascending aorta measuring 44 mm. 12. Moderately elevated pulmonary artery systolic pressure. 13. The inferior vena cava is normal in size with greater than 50% respiratory variability, suggesting right atrial pressure of 3 mmHg.  FINDINGS  Left Ventricle: Left ventricular ejection fraction, by visual estimation, is 60 to 65%. The left ventricle has normal function. There is mildly increased left ventricular hypertrophy. Normal left ventricular size. Spectral Doppler shows Left ventricular  diastolic Doppler parameters are consistent with impaired relaxation pattern of LV diastolic filling.  Right Ventricle: The right ventricular size is normal. No increase in right ventricular wall thickness. Global RV systolic function is has  normal systolic function. The tricuspid regurgitant velocity is 3.26 m/s, and with an assumed right atrial pressure  of 3 mmHg, the estimated right ventricular systolic pressure is moderately elevated at 45.5 mmHg.  Left Atrium: Left atrial size was normal in size.  Right Atrium: Right atrial size was normal in size  Pericardium: There is no evidence of pericardial effusion.  Mitral Valve: The mitral valve is normal in structure. Mild to moderate mitral annular calcification. No evidence of mitral valve stenosis by observation. No evidence of mitral valve regurgitation.  Tricuspid Valve: The tricuspid valve is normal in structure. Tricuspid valve regurgitation was not visualized by color flow Doppler.  Aortic Valve: The aortic valve is bicuspid. Aortic valve regurgitation is mild by color flow Doppler. Aortic regurgitation PHT measures 220 msec. Severe aortic stenosis is present. Aortic valve mean gradient measures 72.2 mmHg. Aortic valve peak gradient  measures 123.5 mmHg. Aortic valve area, by VTI measures 0.50 cm.  Pulmonic Valve: The pulmonic valve was normal in structure. Pulmonic valve regurgitation is not visualized by color flow Doppler.  Aorta: The aortic root, ascending aorta and aortic arch are all structurally normal, with no evidence of dilitation or obstruction. There is mild to moderate dilatation of the ascending aorta measuring 44 mm.  Venous: The inferior vena cava is normal in size with greater than 50% respiratory variability, suggesting right atrial pressure of 3 mmHg.  IAS/Shunts: No atrial level shunt detected by color flow Doppler. No ventricular septal defect is seen or detected. There is no evidence of an atrial septal defect.     LEFT VENTRICLE PLAX 2D LVIDd:         5.36 cm  Diastology LVIDs:  3.26 cm  LV e' lateral:   6.64 cm/s LV PW:         1.53 cm  LV E/e' lateral: 13.7 LV IVS:        1.14 cm  LV e' medial:    5.55 cm/s LVOT diam:      2.10 cm  LV E/e' medial:  16.4 LV SV:         96 ml LV SV Index:   46.43 LVOT Area:     3.46 cm    RIGHT VENTRICLE             IVC RV Basal diam:  2.94 cm     IVC diam: 1.00 cm RV S prime:     10.70 cm/s  LEFT ATRIUM             Index       RIGHT ATRIUM           Index LA diam:        3.60 cm 1.78 cm/m  RA Area:     18.10 cm LA Vol (A2C):   66.6 ml 32.92 ml/m RA Volume:   51.70 ml  25.55 ml/m LA Vol (A4C):   48.6 ml 24.02 ml/m LA Biplane Vol: 57.2 ml 28.27 ml/m  AORTIC VALVE AV Area (Vmax):    0.44 cm AV Area (Vmean):   0.43 cm AV Area (VTI):     0.50 cm AV Vmax:           555.67 cm/s AV Vmean:          399.200 cm/s AV VTI:            1.424 m AV Peak Grad:      123.5 mmHg AV Mean Grad:      72.2 mmHg LVOT Vmax:         70.00 cm/s LVOT Vmean:        49.900 cm/s LVOT VTI:          0.207 m LVOT/AV VTI ratio: 0.15 AI PHT:            220 msec   AORTA Ao Root diam: 3.30 cm Ao Asc diam:  4.40 cm  MITRAL VALVE                        TRICUSPID VALVE MV Area (PHT): 3.83 cm             TR Peak grad:   42.5 mmHg MV PHT:        57.42 msec           TR Vmax:        326.00 cm/s MV Decel Time: 198 msec MV E velocity: 90.80 cm/s 103 cm/s  SHUNTS MV A velocity: 66.70 cm/s 70.3 cm/s Systemic VTI:  0.21 m MV E/A ratio:  1.36       1.5       Systemic Diam: 2.10 cm    Belva Crome MD Electronically signed by Belva Crome MD Signature Date/Time: 11/10/2018/1:16:00 PM      Physicians  Panel Physicians Referring Physician Case Authorizing Physician  Corky Crafts, MD (Primary)    Procedures  RIGHT/LEFT HEART CATH AND CORONARY ANGIOGRAPHY  Conclusion    Mid LAD lesion is 25% stenosed.  Hemodynamic findings consistent with mild pulmonary hypertension. Volume overload based on PCWP.  Ao sat 98%, PA sat 72%, CO 4.8 L/min; CI 2.37; mean PA 31 mm Hg; mean PCWP  27 mm Hg  Severely calcified aortic valve.   No significant CAD.  Aortic valve was not  crossed, per Dr. Servando Salina.  Continue with plans for aortic valve replacement.    Indications  Severe aortic valve stenosis [I35.0 (ICD-10-CM)]  Procedural Details  Technical Details The risks, benefits, and details of the procedure were explained to the patient.  The patient verbalized understanding and wanted to proceed.  Informed written consent was obtained.  PROCEDURE TECHNIQUE:  After Xylocaine anesthesia, a 5 French sheath was placed in the right brachial in exchange for a peripheral IV. A 5 French balloontipped Swan-Ganz catheter was advanced to the pulmonary artery under fluoroscopic guidance. Hemodynamic pressures were obtained. Oxygen saturations were obtained. After Xylocaine anesthesia, a 59F sheath was placed in the right radial artery with a single anterior needle wall stick.   Left coronary angiography was done using a Judkins L4 guide catheter.  Right coronary angiography was done using a Judkins R4 guide catheter.  Left heart cath was not done.       Contrast: 55 cc    Estimated blood loss <50 mL.   During this procedure medications were administered to achieve and maintain moderate conscious sedation while the patient's heart rate, blood pressure, and oxygen saturation were continuously monitored and I was present face-to-face 100% of this time.  Medications (Filter: Administrations occurring from 11/22/18 0721 to 11/22/18 0819) Medication Rate/Dose/Volume Action  Date Time   midazolam (VERSED) injection (mg) 2 mg Given 11/22/18 0740   Total dose as of 11/22/18 0819        2 mg        fentaNYL (SUBLIMAZE) injection (mcg) 25 mcg Given 11/22/18 0740   Total dose as of 11/22/18 0819        25 mcg        lidocaine (PF) (XYLOCAINE) 1 % injection (mL) 2 mL Given 11/22/18 0752   Total dose as of 11/22/18 0819 2 mL Given 0754   4 mL        Heparin (Porcine) in NaCl 1000-0.9 UT/500ML-% SOLN (mL) 500 mL Given 11/22/18 0752   Total dose as of 11/22/18 0819 500 mL Given 0752     1,000 mL        Radial Cocktail/Verapamil only (mL) 10 mL Given 11/22/18 0756   Total dose as of 11/22/18 0819        10 mL        heparin injection (Units) 4,500 Units Given 11/22/18 0805   Total dose as of 11/22/18 0819        4,500 Units        iohexol (OMNIPAQUE) 350 MG/ML injection (mL) 55 mL Given 11/22/18 0812   Total dose as of 11/22/18 0819        55 mL        Sedation Time  Sedation Time Physician-1: 31 minutes 10 seconds  Contrast  Medication Name Total Dose  iohexol (OMNIPAQUE) 350 MG/ML injection 55 mL    Radiation/Fluoro  Fluoro time: 2.7 (min) DAP: 13305 (mGycm2) Cumulative Air Kerma: 268 (mGy)  Complications  Complications documented before study signed (11/22/2018 8:26 AM)   No complications were associated with this study.  Documented by Corky Crafts, MD - 11/22/2018 8:24 AM    Coronary Findings  Diagnostic Dominance: Right Left Anterior Descending  Mid LAD lesion 25% stenosed  Mid LAD lesion is 25% stenosed.  Right Coronary Artery  Vessel is large.  Intervention  No interventions have been documented.  Right Heart  Right Heart Pressures Hemodynamic findings consistent with mild pulmonary hypertension. Ao sat 98%, PA sat 72%, CO 4.8 L/min; CI 2.37; mean PA 31 mm Hg; mean PCWP 27 mm Hg  Right Atrium Right atrial pressure is elevated.  Left Heart  Aortic Valve The aortic valve is calcified. Known aortic stenosis  Coronary Diagrams  Diagnostic Dominance: Right  Intervention  Implants   No implant documentation for this case.  Syngo Images  Show images for CARDIAC CATHETERIZATION  Images on Long Term Storage  Show images for Cody Hebert, Cody Hebert to Procedure Log  Procedure Log    Hemo Data   Most Recent Value  Fick Cardiac Output 4.8 L/min  Fick Cardiac Output Index 2.37 (L/min)/BSA  RA A Wave 20 mmHg  RA V Wave 19 mmHg  RA Mean 17 mmHg  RV Systolic Pressure 43 mmHg  RV Diastolic Pressure 14 mmHg  RV EDP 19 mmHg   PA Systolic Pressure 40 mmHg  PA Diastolic Pressure 26 mmHg  PA Mean 31 mmHg  PW A Wave 29 mmHg  PW V Wave 28 mmHg  PW Mean 27 mmHg  AO Systolic Pressure 270 mmHg  AO Diastolic Pressure 79 mmHg  AO Mean 94 mmHg  QP/QS 1  TPVR Index 13.1 HRUI  TSVR Index 39.74 HRUI  PVR SVR Ratio 0.05  TPVR/TSVR Ratio 0.33    Impression:  This 58 year old gentleman has stage D, critical, symptomatic bicuspid aortic valve stenosis with NYHA class lll symptoms of exertional fatigue and shortness of breath as well as exertional chest discomfort consistent with chronic diastolic congestive heart failure. I have personally reviewed his 2D echo and cardiac cath. His echo shows a severely calcified bicuspid aortic valve with a mean gradient of 72 mm Hg and an AVA of 0.5 cm2 consistent with critical AS. Cath shows mild insignificant LAD stenosis, mild pulmonary HTN and a mean PCW pressure of 27 mm Hg. I agree that AVR is indicated in this patient. I don't think he is a candidate for TAVR due to his young age. I think open surgical AVR is the best treatment for him. I discussed the alternatives of mechanical and bioprosthetic valves including the pros and cons of both. He would like to use a bioprosthetic valve so that he does not need to be on Coumadin. Given his age of 77 I think it is a reasonable alternative since the current generation of Edwards pericardial valve has very good longevity and would allow TAVR in the future if he did have significant structural deterioration. He will need to have a CTA of the chest to accurately assess the thoracic aorta for significant aneurysm since he has a bicuspid valve which is associated with aneurysm in at least 10% of cases.  I discussed the operative procedure with the patient and his wife including alternatives, benefits and risks; including but not limited to bleeding, blood transfusion, infection, stroke, myocardial infarction, graft failure, heart block requiring a  permanent pacemaker, organ dysfunction, and death.  Cody Hebert understands and agrees to proceed.    Plan:  I will schedule him for AVR using a bioprosthetic valve on Thursday 10/22. I will call him after the CTA of the chest is done to discuss the results with him.  I spent 60 minutes performing this consultation and > 50% of this time was spent face to face counseling and coordinating the care of this patient's critical bicuspid aortic valve stenosis.  Gaye Pollack, MD  [facility] [phone]

## 2018-11-29 ENCOUNTER — Telehealth: Payer: Self-pay

## 2018-11-29 NOTE — Telephone Encounter (Signed)
Results relayed to patient, no questions.

## 2018-11-29 NOTE — Telephone Encounter (Signed)
-----   Message from Berniece Salines, DO sent at 11/15/2018  8:14 AM EDT ----- Normal labs

## 2018-12-01 ENCOUNTER — Ambulatory Visit
Admission: RE | Admit: 2018-12-01 | Discharge: 2018-12-01 | Disposition: A | Discharge status: Home / Self Care | Payer: 59 | Source: Ambulatory Visit | Attending: Surgery | Admitting: Surgery

## 2018-12-01 ENCOUNTER — Other Ambulatory Visit: Payer: Self-pay

## 2018-12-01 DIAGNOSIS — I350 Nonrheumatic aortic (valve) stenosis: Secondary | ICD-10-CM

## 2018-12-01 MED ORDER — IOPAMIDOL (ISOVUE-370) INJECTION 76%
75.0000 mL | Freq: Once | INTRAVENOUS | Status: AC | PRN
  Administered 2018-12-01: 75 mL via INTRAVENOUS

## 2018-12-05 ENCOUNTER — Other Ambulatory Visit (HOSPITAL_COMMUNITY): Payer: 59

## 2018-12-05 ENCOUNTER — Other Ambulatory Visit (HOSPITAL_COMMUNITY)
Admission: RE | Admit: 2018-12-05 | Discharge: 2018-12-05 | Disposition: A | Discharge status: Home / Self Care | Payer: 59 | Source: Ambulatory Visit | Attending: Surgery | Admitting: Surgery

## 2018-12-05 ENCOUNTER — Other Ambulatory Visit: Payer: Self-pay

## 2018-12-05 ENCOUNTER — Encounter (HOSPITAL_COMMUNITY)
Admission: RE | Admit: 2018-12-05 | Discharge: 2018-12-05 | Disposition: A | Discharge status: Home / Self Care | Payer: 59 | Source: Ambulatory Visit | Attending: Surgery | Admitting: Surgery

## 2018-12-05 ENCOUNTER — Ambulatory Visit: Payer: 59 | Admitting: Cardiology

## 2018-12-05 ENCOUNTER — Ambulatory Visit (HOSPITAL_BASED_OUTPATIENT_CLINIC_OR_DEPARTMENT_OTHER)
Admission: RE | Admit: 2018-12-05 | Discharge: 2018-12-05 | Disposition: A | Discharge status: Home / Self Care | Payer: 59 | Source: Ambulatory Visit | Attending: Surgery | Admitting: Surgery

## 2018-12-05 ENCOUNTER — Ambulatory Visit (HOSPITAL_COMMUNITY)
Admission: RE | Admit: 2018-12-05 | Discharge: 2018-12-05 | Disposition: A | Discharge status: Home / Self Care | Payer: 59 | Source: Ambulatory Visit | Attending: Surgery | Admitting: Surgery

## 2018-12-05 ENCOUNTER — Encounter (HOSPITAL_COMMUNITY): Payer: Self-pay

## 2018-12-05 DIAGNOSIS — K219 Gastro-esophageal reflux disease without esophagitis: Secondary | ICD-10-CM | POA: Insufficient documentation

## 2018-12-05 DIAGNOSIS — Z87891 Personal history of nicotine dependence: Secondary | ICD-10-CM | POA: Insufficient documentation

## 2018-12-05 DIAGNOSIS — I350 Nonrheumatic aortic (valve) stenosis: Secondary | ICD-10-CM | POA: Diagnosis not present

## 2018-12-05 DIAGNOSIS — Z9852 Vasectomy status: Secondary | ICD-10-CM | POA: Insufficient documentation

## 2018-12-05 DIAGNOSIS — E785 Hyperlipidemia, unspecified: Secondary | ICD-10-CM | POA: Insufficient documentation

## 2018-12-05 DIAGNOSIS — R9431 Abnormal electrocardiogram [ECG] [EKG]: Secondary | ICD-10-CM | POA: Insufficient documentation

## 2018-12-05 DIAGNOSIS — I6523 Occlusion and stenosis of bilateral carotid arteries: Secondary | ICD-10-CM | POA: Insufficient documentation

## 2018-12-05 DIAGNOSIS — I444 Left anterior fascicular block: Secondary | ICD-10-CM | POA: Insufficient documentation

## 2018-12-05 DIAGNOSIS — Z01818 Encounter for other preprocedural examination: Secondary | ICD-10-CM | POA: Insufficient documentation

## 2018-12-05 DIAGNOSIS — Z791 Long term (current) use of non-steroidal anti-inflammatories (NSAID): Secondary | ICD-10-CM | POA: Insufficient documentation

## 2018-12-05 DIAGNOSIS — I77810 Thoracic aortic ectasia: Secondary | ICD-10-CM | POA: Insufficient documentation

## 2018-12-05 DIAGNOSIS — Z20828 Contact with and (suspected) exposure to other viral communicable diseases: Secondary | ICD-10-CM | POA: Insufficient documentation

## 2018-12-05 DIAGNOSIS — Z79899 Other long term (current) drug therapy: Secondary | ICD-10-CM | POA: Insufficient documentation

## 2018-12-05 DIAGNOSIS — Z7982 Long term (current) use of aspirin: Secondary | ICD-10-CM | POA: Insufficient documentation

## 2018-12-05 HISTORY — DX: Pneumonia, unspecified organism: J18.9

## 2018-12-05 HISTORY — DX: Thoracic aortic ectasia: I77.810

## 2018-12-05 HISTORY — DX: Nonrheumatic aortic (valve) stenosis: I35.0

## 2018-12-05 LAB — COMPREHENSIVE METABOLIC PANEL
ALT: 31 U/L (ref 0–44)
AST: 31 U/L (ref 15–41)
Albumin: 4 g/dL (ref 3.5–5.0)
Alkaline Phosphatase: 53 U/L (ref 38–126)
Anion gap: 9 (ref 5–15)
BUN: 12 mg/dL (ref 6–20)
CO2: 21 mmol/L — ABNORMAL LOW (ref 22–32)
Calcium: 9.2 mg/dL (ref 8.9–10.3)
Chloride: 103 mmol/L (ref 98–111)
Creatinine, Ser: 0.93 mg/dL (ref 0.61–1.24)
GFR calc Af Amer: >60 mL/min (ref >60)
GFR calc non Af Amer: >60 mL/min (ref >60)
Glucose, Bld: 94 mg/dL (ref 70–99)
Potassium: 4.5 mmol/L (ref 3.5–5.1)
Sodium: 133 mmol/L — ABNORMAL LOW (ref 135–145)
Total Bilirubin: 0.7 mg/dL (ref 0.3–1.2)
Total Protein: 6.8 g/dL (ref 6.5–8.1)

## 2018-12-05 LAB — BLOOD GAS, ARTERIAL
Acid-Base Excess: 1.2 mmol/L (ref 0.0–2.0)
Bicarbonate: 24.8 mmol/L (ref 20.0–28.0)
Drawn by: 421801
FIO2: 21
O2 Saturation: 98.1 %
Patient temperature: 98.6
pCO2 arterial: 36.1 mmHg (ref 32.0–48.0)
pH, Arterial: 7.452 — ABNORMAL HIGH (ref 7.350–7.450)
pO2, Arterial: 97.2 mmHg (ref 83.0–108.0)

## 2018-12-05 LAB — URINALYSIS, ROUTINE W REFLEX MICROSCOPIC
Bilirubin Urine: NEGATIVE
Glucose, UA: NEGATIVE mg/dL
Ketones, ur: NEGATIVE mg/dL
Leukocytes,Ua: NEGATIVE
Nitrite: NEGATIVE
Protein, ur: NEGATIVE mg/dL
Specific Gravity, Urine: 1.017 (ref 1.005–1.030)
pH: 5 (ref 5.0–8.0)

## 2018-12-05 LAB — CBC
HCT: 44.6 % (ref 39.0–52.0)
Hemoglobin: 15.7 g/dL (ref 13.0–17.0)
MCH: 32.6 pg (ref 26.0–34.0)
MCHC: 35.2 g/dL (ref 30.0–36.0)
MCV: 92.7 fL (ref 80.0–100.0)
Platelets: 209 10*3/uL (ref 150–400)
RBC: 4.81 MIL/uL (ref 4.22–5.81)
RDW: 13.1 % (ref 11.5–15.5)
WBC: 8.8 10*3/uL (ref 4.0–10.5)
nRBC: 0 % (ref 0.0–0.2)

## 2018-12-05 LAB — APTT: aPTT: 34 seconds (ref 24–36)

## 2018-12-05 LAB — SURGICAL PCR SCREEN
MRSA, PCR: NEGATIVE
Staphylococcus aureus: POSITIVE — AB

## 2018-12-05 LAB — HEMOGLOBIN A1C
Hgb A1c MFr Bld: 5.3 % (ref 4.8–5.6)
Mean Plasma Glucose: 105.41 mg/dL

## 2018-12-05 LAB — PROTIME-INR
INR: 1.1 (ref 0.8–1.2)
Prothrombin Time: 14.1 seconds (ref 11.4–15.2)

## 2018-12-05 LAB — ABO/RH: ABO/RH(D): A NEG

## 2018-12-05 NOTE — Progress Notes (Signed)
PCP - Working on getting a new PCP Cardiologist - Kardie Tobb    Chest x-ray - 12/05/18 EKG - 12/05/18 Stress Test - denies ECHO - 11/10/18 Cardiac Cath - 11/22/18  Aspirin Instructions: continue  COVID TEST- 12/05/18   Anesthesia review: yes, cardiac hx  Patient denies shortness of breath, fever, cough and chest pain at PAT appointment   All instructions explained to the patient, with a verbal understanding of the material. Patient agrees to go over the instructions while at home for a better understanding. Patient also instructed to self quarantine after being tested for COVID-19. The opportunity to ask questions was provided.

## 2018-12-05 NOTE — Progress Notes (Signed)
[id number] - [place], [address] [place] [address] Phone: [phone] Fax: [phone]      Your procedure is scheduled on October 22  Report to [facility] Main Entrance "A" at 0530 A.M., and check in at the Admitting office.  Call this number if you have problems the morning of surgery:  [phone]  Call [phone] if you have any questions prior to your surgery date Monday-Friday 8am-4pm    Remember:  Do not eat or drink after midnight the night before your surgery    Take these medicines the morning of surgery with A SIP OF WATER  loratadine (CLARITIN)  omeprazole (PRILOSEC)  7 days prior to surgery STOP taking any Aleve, Naproxen, Ibuprofen, Motrin, Advil, Goody's, BC's, all herbal medications, fish oil, and all vitamins.  Continue Aspirin but do not take the morning of surgery   The Morning of Surgery  Do not wear jewelry  Do not wear lotions, powders, or colognes, or deodorant   Men may shave face and neck.  Do not bring valuables to the hospital.  [facility] is not responsible for any belongings or valuables.  If you are a smoker, DO NOT Smoke 24 hours prior to surgery IF you wear a CPAP at night please bring your mask, tubing, and machine the morning of surgery   Remember that you must have someone to transport you home after your surgery, and remain with you for 24 hours if you are discharged the same day.   Contacts, glasses, hearing aids, dentures or bridgework may not be worn into surgery.    Leave your suitcase in the car.  After surgery it may be brought to your room.  For patients admitted to the hospital, discharge time will be determined by your treatment team.  Patients discharged the day of surgery will not be allowed to drive home.    Special instructions:   [place]- Preparing For Surgery  Before surgery, you can play an important role. Because skin is not sterile,  your skin needs to be as free of germs as possible. You can reduce the number of germs on your skin by washing with CHG (chlorahexidine gluconate) Soap before surgery.  CHG is an antiseptic cleaner which kills germs and bonds with the skin to continue killing germs even after washing.    Oral Hygiene is also important to reduce your risk of infection.  Remember - BRUSH YOUR TEETH THE MORNING OF SURGERY WITH YOUR REGULAR TOOTHPASTE  Please do not use if you have an allergy to CHG or antibacterial soaps. If your skin becomes reddened/irritated stop using the CHG.  Do not shave (including legs and underarms) for at least 48 hours prior to first CHG shower. It is OK to shave your face.  Please follow these instructions carefully.   1. Shower the NIGHT BEFORE SURGERY and the MORNING OF SURGERY with CHG Soap.   2. If you chose to wash your hair, wash your hair first as usual with your normal shampoo.  3. After you shampoo, rinse your hair and body thoroughly to remove the shampoo.  4. Use CHG as you would any other liquid soap. You can apply CHG directly to the skin and wash gently with a scrungie or a clean washcloth.   5. Apply the CHG Soap to your body ONLY FROM THE NECK DOWN.  Do not use on open wounds or open sores. Avoid contact  with your eyes, ears, mouth and genitals (private parts). Wash Face and genitals (private parts)  with your normal soap.   6. Wash thoroughly, paying special attention to the area where your surgery will be performed.  7. Thoroughly rinse your body with warm water from the neck down.  8. DO NOT shower/wash with your normal soap after using and rinsing off the CHG Soap.  9. Pat yourself dry with a CLEAN TOWEL.  10. Wear CLEAN PAJAMAS to bed the night before surgery, wear comfortable clothes the morning of surgery  11. Place CLEAN SHEETS on your bed the night of your first shower and DO NOT SLEEP WITH PETS.    Day of Surgery:  Do not apply any  deodorants/lotions. Please shower the morning of surgery with the CHG soap  Please wear clean clothes to the hospital/surgery center.   Remember to brush your teeth WITH YOUR REGULAR TOOTHPASTE.   Please read over the following fact sheets that you were given.

## 2018-12-05 NOTE — Progress Notes (Addendum)
Anesthesia Chart Review:  Case: [id number] Date/Time: 12/08/18 0715   Procedures:      AORTIC VALVE REPLACEMENT (AVR) (N/A Chest)     TRANSESOPHAGEAL ECHOCARDIOGRAM (TEE) (N/A )   Anesthesia type: General   Pre-op diagnosis: SEVERE AS   Location: [facility] OR ROOM 17 / [place] [facility]   Surgeon: Gaye Pollack, MD      DISCUSSION: Patient is a 58 year old male scheduled for the above procedure.  History includes smoking, bicuspid AV with severe AS, TAA, HLD, GERD.   12/05/18 COVID-19 test is negative. If no acute changes then I would anticipate that he can proceed as planned. He is for PFTs 12/07/18.   VS: BP 112/74   Pulse 71   Temp 36.9 C   Resp 18   Ht 5\' 9"  (1.753 m)   Wt 85.2 kg   SpO2 98%   BMI 27.73 kg/m   PROVIDERS: No PCP currently.  Berniece Salines, DO is cardiologist   LABS: Labs reviewed: Acceptable for surgery. (all labs ordered are listed, but only abnormal results are displayed)  Labs Reviewed  SURGICAL PCR SCREEN - Abnormal; Notable for the following components:      Result Value   Staphylococcus aureus POSITIVE (*)    All other components within normal limits  BLOOD GAS, ARTERIAL - Abnormal; Notable for the following components:   pH, Arterial 7.452 (*)    All other components within normal limits  COMPREHENSIVE METABOLIC PANEL - Abnormal; Notable for the following components:   Sodium 133 (*)    CO2 21 (*)    All other components within normal limits  URINALYSIS, ROUTINE W REFLEX MICROSCOPIC - Abnormal; Notable for the following components:   Hgb urine dipstick SMALL (*)    Bacteria, UA RARE (*)    All other components within normal limits  APTT  CBC  HEMOGLOBIN A1C  PROTIME-INR  TYPE AND SCREEN  ABO/RH     IMAGES: CXR 12/05/18: IMPRESSION: No active cardiopulmonary disease.  CTA chest 12/01/18: IMPRESSION: 1. Extensive severe calcification of the aortic leaflets compatible with provided history of severe aortic stenosis. 2. Ascending aortic  dilatation with the mid ascending aorta measuring up to 4.3 cm in diameter. Recommend annual imaging followup by CTA or MRA. This recommendation follows 2010 ACCF/AHA/AATS/ACR/ASA/SCA/SCAI/SIR/STS/SVM Guidelines for the Diagnosis and Management of Patients with Thoracic Aortic Disease. Circulation. 2010; 121: D924-Q683. Aortic aneurysm NOS (ICD10-I71.9) 3. Aortic Atherosclerosis (ICD10-I70.0) 4. Calcified plaque in the LAD. 5. Paraseptal emphysema (ICD10-J43.9). Additional bronchitic changes and features of small airways disease.   EKG: 12/05/18: Normal sinus rhythm Possible Left atrial enlargement Left anterior fascicular block Left ventricular hypertrophy with QRS widening and repolarization abnormality ( R in aVL , Cornell product , Romhilt-Estes ) Abnormal ECG No old tracing to compare Confirmed by Thompson Grayer ([id number]) on 12/05/2018 8:16:53 PM   CV: Carotid US 12/05/18 (Preliminary): Summary: - Right Carotid: Velocities in the right ICA are consistent with a 1-39% stenosis. - Left Carotid: Velocities in the left ICA are consistent with a 1-39% stenosis.   Cardiac cath 11/22/18:  Mid LAD lesion is 25% stenosed.  Hemodynamic findings consistent with mild pulmonary hypertension. Volume overload based on PCWP.  Ao sat 98%, PA sat 72%, CO 4.8 L/min; CI 2.37; mean PA 31 mm Hg; mean PCWP 27 mm Hg  Severely calcified aortic valve. No significant CAD.  Aortic valve was not crossed, per Dr. Harriet Masson. Continue with plans for aortic valve replacement.    Echo 11/10/18: IMPRESSIONS  1. Left ventricular ejection fraction, by visual estimation, is 60 to 65%. The left ventricle has normal function. Normal left ventricular size. There is mildly increased left ventricular hypertrophy.  2. Left ventricular diastolic Doppler parameters are consistent with impaired relaxation pattern of LV diastolic filling.  3. Global right ventricle has normal systolic function.The right ventricular size  is normal. No increase in right ventricular wall thickness.  4. Left atrial size was normal.  5. Right atrial size was normal.  6. Mild to moderate mitral annular calcification.  7. The mitral valve is normal in structure. No evidence of mitral valve regurgitation. No evidence of mitral stenosis.  8. The tricuspid valve is normal in structure. Tricuspid valve regurgitation was not visualized by color flow Doppler.  9. The aortic valve is bicuspid Aortic valve regurgitation is mild by color flow Doppler. Severe aortic valve stenosis. AV Area (Vmax):    0.44 cm AV Area (Vmean):   0.43 cm AV Area (VTI):     0.50 cm AV Vmax:           555.67 cm/s AV Vmean:          399.200 cm/s AV VTI:            1.424 m AV Peak Grad:      123.5 mmHg AV Mean Grad:      72.2 mmHg LVOT Vmax:         70.00 cm/s LVOT Vmean:        49.900 cm/s LVOT VTI:          0.207 m LVOT/AV VTI ratio: 0.15 AI PHT:            220 msec 10. The pulmonic valve was normal in structure. Pulmonic valve regurgitation is not visualized by color flow Doppler. 11. There is mild to moderate dilatation of the ascending aorta measuring 44 mm. 12. Moderately elevated pulmonary artery systolic pressure. 13. The inferior vena cava is normal in size with greater than 50% respiratory variability, suggesting right atrial pressure of 3 mmHg.    Past Medical History:  Diagnosis Date  . Aortic stenosis   . Ascending aorta dilatation (HCC)   . GERD (gastroesophageal reflux disease)   . Hyperlipidemia   . Pneumonia    hx in the past    Past Surgical History:  Procedure Laterality Date  . ANTERIOR CRUCIATE LIGAMENT REPAIR    . EYE SURGERY    . RIGHT/LEFT HEART CATH AND CORONARY ANGIOGRAPHY N/A 11/22/2018   Procedure: RIGHT/LEFT HEART CATH AND CORONARY ANGIOGRAPHY;  Surgeon: Corky Crafts, MD;  Location: [facility] INVASIVE CV LAB;  Service: Cardiovascular;  Laterality: N/A;  . VASECTOMY      MEDICATIONS: . aspirin EC 81 MG tablet   . Aspirin-Acetaminophen-Caffeine (GOODYS EXTRA STRENGTH) 520-260-32.5 MG PACK  . atorvastatin (LIPITOR) 20 MG tablet  . Coenzyme Q10 (COQ10) 100 MG CAPS  . loratadine (CLARITIN) 10 MG tablet  . naproxen sodium (ALEVE) 220 MG tablet  . nitroGLYCERIN (NITROSTAT) 0.4 MG SL tablet  . omeprazole (PRILOSEC) 20 MG capsule   No current facility-administered medications for this encounter.     Shonna Chock, PA-C Surgical Short Stay/Anesthesiology [facility] Phone [phone] [facility] Phone [phone] 12/06/2018 9:04 AM

## 2018-12-06 LAB — NOVEL CORONAVIRUS, NAA (HOSP ORDER, SEND-OUT TO REF LAB; TAT 18-24 HRS): SARS-CoV-2, NAA: NOT DETECTED

## 2018-12-06 NOTE — Progress Notes (Signed)
Cody Hebert called with questions regarding Mupirocin treatment.  Patient voiced understanding.

## 2018-12-06 NOTE — Anesthesia Preprocedure Evaluation (Addendum)
Anesthesia Evaluation  Patient identified by MRN, date of birth, ID band Patient awake    Reviewed: Allergy & Precautions, NPO status , Patient's Chart, lab work & pertinent test results  Airway Mallampati: II  TM Distance: >3 FB Neck ROM: Full    Dental  (+) Dental Advisory Given   Pulmonary pneumonia, resolved, Current Smoker and Patient abstained from smoking.,    Pulmonary exam normal breath sounds clear to auscultation       Cardiovascular + Valvular Problems/Murmurs AS  Rhythm:Regular Rate:Normal + Systolic murmurs Echo 05/01/1759  1. Left ventricular ejection fraction, by visual estimation, is 60 to 65%. The left ventricle has normal function. Normal left ventricular size. There is mildly increased left ventricular hypertrophy.  2. Left ventricular diastolic Doppler parameters are consistent with impaired relaxation pattern of LV diastolic filling.  3. Global right ventricle has normal systolic function.The right ventricular size is normal. No increase in right ventricular wall thickness.  4. Left atrial size was normal.  5. Right atrial size was normal.  6. Mild to moderate mitral annular calcification.  7. The mitral valve is normal in structure. No evidence of mitral valve regurgitation. No evidence of mitral stenosis.  8. The tricuspid valve is normal in structure. Tricuspid valve regurgitation was not visualized by color flow Doppler.  9. The aortic valve is bicuspid Aortic valve regurgitation is mild by color flow Doppler. Severe aortic valve stenosis. 10. The pulmonic valve was normal in structure. Pulmonic valve regurgitation is not visualized by color flow Doppler. 11. There is mild to moderate dilatation of the ascending aorta measuring 44 mm. 12. Moderately elevated pulmonary artery systolic pressure. 13. The inferior vena cava is normal in size with greater than 50% respiratory variability, suggesting right atrial  pressure of 3 mmHg   Neuro/Psych negative neurological ROS  negative psych ROS   GI/Hepatic Neg liver ROS, GERD  ,  Endo/Other  negative endocrine ROS  Renal/GU negative Renal ROS     Musculoskeletal negative musculoskeletal ROS (+)   Abdominal   Peds  Hematology negative hematology ROS (+)   Anesthesia Other Findings   Reproductive/Obstetrics                           Anesthesia Physical Anesthesia Plan  ASA: IV  Anesthesia Plan: General   Post-op Pain Management:    Induction: Intravenous  PONV Risk Score and Plan: 2 and Midazolam and Treatment may vary due to age or medical condition  Airway Management Planned: Oral ETT  Additional Equipment: Arterial line, CVP, PA Cath, TEE and Ultrasound Guidance Line Placement  Intra-op Plan: Utilization Of Total Body Hypothermia per surgeon request  Post-operative Plan: Post-operative intubation/ventilation  Informed Consent: I have reviewed the patients History and Physical, chart, labs and discussed the procedure including the risks, benefits and alternatives for the proposed anesthesia with the patient or authorized representative who has indicated his/her understanding and acceptance.     Dental advisory given  Plan Discussed with: CRNA  Anesthesia Plan Comments: (PAT note written by Myra Gianotti, PA-C. )      Anesthesia Quick Evaluation

## 2018-12-07 ENCOUNTER — Ambulatory Visit (HOSPITAL_COMMUNITY)
Admission: RE | Admit: 2018-12-07 | Discharge: 2018-12-07 | Disposition: A | Discharge status: Home / Self Care | Payer: 59 | Source: Ambulatory Visit | Attending: Surgery | Admitting: Surgery

## 2018-12-07 ENCOUNTER — Ambulatory Visit: Payer: 59 | Admitting: Cardiology

## 2018-12-07 ENCOUNTER — Other Ambulatory Visit: Payer: Self-pay

## 2018-12-07 DIAGNOSIS — I350 Nonrheumatic aortic (valve) stenosis: Secondary | ICD-10-CM | POA: Insufficient documentation

## 2018-12-07 LAB — PULMONARY FUNCTION TEST
DL/VA % pred: 108 %
DL/VA: 4.72 ml/min/mmHg/L
DLCO cor % pred: 103 %
DLCO cor: 28.34 ml/min/mmHg
DLCO unc % pred: 106 %
DLCO unc: 29.18 ml/min/mmHg
FEF 25-75 Post: 2.95 L/sec
FEF 25-75 Pre: 2.37 L/sec
FEF2575-%Change-Post: 24 %
FEF2575-%Pred-Post: 95 %
FEF2575-%Pred-Pre: 77 %
FEV1-%Change-Post: 5 %
FEV1-%Pred-Post: 82 %
FEV1-%Pred-Pre: 78 %
FEV1-Post: 2.98 L
FEV1-Pre: 2.82 L
FEV1FVC-%Change-Post: 1 %
FEV1FVC-%Pred-Pre: 98 %
FEV6-%Change-Post: 4 %
FEV6-%Pred-Post: 85 %
FEV6-%Pred-Pre: 82 %
FEV6-Post: 3.87 L
FEV6-Pre: 3.72 L
FEV6FVC-%Change-Post: 0 %
FEV6FVC-%Pred-Post: 104 %
FEV6FVC-%Pred-Pre: 103 %
FVC-%Change-Post: 3 %
FVC-%Pred-Post: 81 %
FVC-%Pred-Pre: 79 %
FVC-Post: 3.87 L
FVC-Pre: 3.74 L
Post FEV1/FVC ratio: 77 %
Post FEV6/FVC ratio: 100 %
Pre FEV1/FVC ratio: 75 %
Pre FEV6/FVC Ratio: 99 %
RV % pred: 149 %
RV: 3.16 L
TLC % pred: 103 %
TLC: 7.03 L

## 2018-12-07 MED ORDER — SODIUM CHLORIDE 0.9 % IV SOLN
750.0000 mg | INTRAVENOUS | Status: DC
  Filled 2018-12-07: qty 750

## 2018-12-07 MED ORDER — TRANEXAMIC ACID (OHS) PUMP PRIME SOLUTION
2.0000 mg/kg | INTRAVENOUS | Status: DC
  Filled 2018-12-07: qty 1.7

## 2018-12-07 MED ORDER — SODIUM CHLORIDE 0.9 % IV SOLN
1.5000 g | INTRAVENOUS | Status: AC
  Administered 2018-12-08: 1.5 g via INTRAVENOUS
  Administered 2018-12-08: .75 g via INTRAVENOUS
  Filled 2018-12-07: qty 1.5

## 2018-12-07 MED ORDER — NITROGLYCERIN IN D5W 200-5 MCG/ML-% IV SOLN
2.0000 ug/min | INTRAVENOUS | Status: DC
  Filled 2018-12-07: qty 250

## 2018-12-07 MED ORDER — DEXMEDETOMIDINE HCL IN NACL 400 MCG/100ML IV SOLN
0.1000 ug/kg/h | INTRAVENOUS | Status: AC
  Administered 2018-12-08: .7 ug/kg/h via INTRAVENOUS
  Filled 2018-12-07: qty 100

## 2018-12-07 MED ORDER — PHENYLEPHRINE HCL-NACL 20-0.9 MG/250ML-% IV SOLN
30.0000 ug/min | INTRAVENOUS | Status: DC
  Filled 2018-12-07: qty 250

## 2018-12-07 MED ORDER — NOREPINEPHRINE 4 MG/250ML-% IV SOLN
0.0000 ug/min | INTRAVENOUS | Status: DC
  Filled 2018-12-07: qty 250

## 2018-12-07 MED ORDER — DOPAMINE-DEXTROSE 3.2-5 MG/ML-% IV SOLN
0.0000 ug/kg/min | INTRAVENOUS | Status: DC
  Filled 2018-12-07: qty 250

## 2018-12-07 MED ORDER — MILRINONE LACTATE IN DEXTROSE 20-5 MG/100ML-% IV SOLN
0.3000 ug/kg/min | INTRAVENOUS | Status: DC
  Filled 2018-12-07: qty 100

## 2018-12-07 MED ORDER — MAGNESIUM SULFATE 50 % IJ SOLN
40.0000 meq | INTRAMUSCULAR | Status: DC
  Filled 2018-12-07: qty 9.85

## 2018-12-07 MED ORDER — PLASMA-LYTE 148 IV SOLN
INTRAVENOUS | Status: AC
  Administered 2018-12-08: 500 mL
  Filled 2018-12-07: qty 2.5

## 2018-12-07 MED ORDER — SODIUM CHLORIDE 0.9 % IV SOLN
INTRAVENOUS | Status: DC
  Filled 2018-12-07: qty 30

## 2018-12-07 MED ORDER — TRANEXAMIC ACID (OHS) BOLUS VIA INFUSION
15.0000 mg/kg | INTRAVENOUS | Status: AC
  Administered 2018-12-08: 1278 mg via INTRAVENOUS
  Filled 2018-12-07: qty 1278

## 2018-12-07 MED ORDER — POTASSIUM CHLORIDE 2 MEQ/ML IV SOLN
80.0000 meq | INTRAVENOUS | Status: DC
  Filled 2018-12-07: qty 40

## 2018-12-07 MED ORDER — TRANEXAMIC ACID 1000 MG/10ML IV SOLN
1.5000 mg/kg/h | INTRAVENOUS | Status: AC
  Administered 2018-12-08: 1.5 mg/kg/h via INTRAVENOUS
  Filled 2018-12-07: qty 25

## 2018-12-07 MED ORDER — INSULIN REGULAR(HUMAN) IN NACL 100-0.9 UT/100ML-% IV SOLN
INTRAVENOUS | Status: AC
  Administered 2018-12-08: .8 [IU]/h via INTRAVENOUS
  Filled 2018-12-07: qty 100

## 2018-12-07 MED ORDER — EPINEPHRINE HCL 5 MG/250ML IV SOLN IN NS
0.0000 ug/min | INTRAVENOUS | Status: DC
  Filled 2018-12-07: qty 250

## 2018-12-07 MED ORDER — VANCOMYCIN HCL 10 G IV SOLR
1500.0000 mg | INTRAVENOUS | Status: AC
  Administered 2018-12-08: 1500 mg via INTRAVENOUS
  Filled 2018-12-07: qty 1500

## 2018-12-07 MED ORDER — ALBUTEROL SULFATE (2.5 MG/3ML) 0.083% IN NEBU
2.5000 mg | INHALATION_SOLUTION | Freq: Once | RESPIRATORY_TRACT | Status: AC
  Administered 2018-12-07: 2.5 mg via RESPIRATORY_TRACT

## 2018-12-07 NOTE — H&P (Signed)
[address]       [place] [id number]             [phone]      Cardiothoracic Surgery Admission History and Physical    PCP is System, Pcp Not In Referring Provider is Thomasene Rippleobb, Kardie, DO      Chief Complaint  Patient presents with  . Aortic Stenosis        HPI:  The patient is a 58 year old gentleman with a history of hyperlipidemia and ongoing 1-2 ppd smoking who was diagnosed with bicuspid aortic valve stenosis several years ago according to him when he was noted to have a heart murmur and had an echo done by Dr. Dulce SellarMunley. He says that he did not follow up since that. He now presented for a DOT physical and reported left-sided chest pain for the past month,  exertional shortness of breath and fatigue for the past year. He denies any dizziness or syncope, no peripheral edema, orthopnea or PND. He had an echo done on 11/10/18 showing critical AS with a bicuspid AV, mean gradient of 72.2 mm Hg and peak of 123.5 mm Hg, AVA 0.5 cm2. LVEF was normal. Aorta was ectatic with measurement of 3.3 cm at root and 4.4 cm ascending.  He is married and lives with his wife.  He works as a heavy load Naval architecttruck driver.       Past Medical History:  Diagnosis Date  . GERD (gastroesophageal reflux disease)   . Hyperlipidemia          Past Surgical History:  Procedure Laterality Date  . ANTERIOR CRUCIATE LIGAMENT REPAIR    . EYE SURGERY    . RIGHT/LEFT HEART CATH AND CORONARY ANGIOGRAPHY N/A 11/22/2018   Procedure: RIGHT/LEFT HEART CATH AND CORONARY ANGIOGRAPHY;  Surgeon: Corky CraftsVaranasi, Jayadeep S, MD;  [facility];  Service: Cardiovascular;  Laterality: N/A;  . VASECTOMY           Family History  Problem Relation Age of Onset  . Cancer Father   . Diabetes Brother   . Congestive Heart Failure Maternal Grandmother     Social History Social History        Tobacco Use  . Smoking status: Current Every Day Smoker    Packs/day:  2.00    Years: 40.00    Pack years: 80.00    Types: Cigarettes  . Smokeless tobacco: Never Used  Substance Use Topics  . Alcohol use: Yes  . Drug use: Never          Current Outpatient Medications  Medication Sig Dispense Refill  . aspirin EC 81 MG tablet Take 1 tablet (81 mg total) by mouth daily. 90 tablet 3  . Aspirin-Acetaminophen-Caffeine (GOODYS EXTRA STRENGTH) 520-260-32.5 MG PACK Take 1 packet by mouth daily as needed (headaches.).    Marland Kitchen. atorvastatin (LIPITOR) 20 MG tablet Take 1 tablet (20 mg total) by mouth daily. (Patient taking differently: Take 20 mg by mouth at bedtime. ) 90 tablet 1  . Coenzyme Q10 (COQ10) 100 MG CAPS Take 100 mg by mouth at bedtime.    Marland Kitchen. loratadine (CLARITIN) 10 MG tablet Take 10 mg by mouth daily as needed for allergies.    . naproxen sodium (ALEVE) 220 MG tablet Take 440 mg by mouth 2 (two) times daily as needed (pain.).    Marland Kitchen. nitroGLYCERIN (NITROSTAT) 0.4 MG SL tablet Place 0.4 mg under the tongue every 5 (five) minutes x 3  doses as needed for chest pain.     Marland Kitchen omeprazole (PRILOSEC) 20 MG capsule Take 20 mg by mouth daily.     No current facility-administered medications for this visit.         Allergies  Allergen Reactions  . Doxycycline Nausea And Vomiting    Review of Systems  Constitutional: Positive for activity change and fatigue.  HENT:       Full dentures  Eyes: Negative.   Respiratory: Positive for cough, chest tightness, shortness of breath and wheezing.   Cardiovascular: Positive for chest pain. Negative for palpitations and leg swelling.  Gastrointestinal: Positive for diarrhea.       Reflux  Endocrine: Negative.   Genitourinary: Negative.   Musculoskeletal: Negative.   Skin: Positive for rash.  Allergic/Immunologic: Negative.   Neurological: Negative for dizziness, syncope, weakness and headaches.  Hematological: Negative.   Psychiatric/Behavioral: Negative.     BP 120/80 (BP Location: Right  Arm, Patient Position: Sitting, Cuff Size: Normal)   Pulse 82   Temp 97.7 F (36.5 C)   Resp 18   Ht  (1.753 m)   Wt 192 lb (87.1 kg)   SpO2 98% Comment: RA  BMI 28.35 kg/m  Physical Exam Constitutional:      Appearance: Normal appearance. He is normal weight.  HENT:     Head: Normocephalic and atraumatic.     Mouth/Throat:     Mouth: Mucous membranes are moist.     Pharynx: Oropharynx is clear.  Eyes:     Extraocular Movements: Extraocular movements intact.     Pupils: Pupils are equal, round, and reactive to light.  Neck:     Musculoskeletal: Normal range of motion and neck supple.  Cardiovascular:     Rate and Rhythm: Normal rate and regular rhythm.     Pulses: Normal pulses.     Heart sounds: Murmur present.     Comments: 3/6 systolic murmur RSB Pulmonary:     Effort: Pulmonary effort is normal.     Breath sounds: Normal breath sounds.  Abdominal:     General: Abdomen is flat. Bowel sounds are normal.     Palpations: Abdomen is soft.  Musculoskeletal: Normal range of motion.        General: No swelling.  Skin:    General: Skin is warm and dry.  Neurological:     General: No focal deficit present.     Mental Status: He is alert and oriented to person, place, and time.  Psychiatric:        Mood and Affect: Mood normal.        Behavior: Behavior normal.        Thought Content: Thought content normal.      Diagnostic Tests:  Result status: Final result  ECHOCARDIOGRAM REPORT     Patient Name: Cody Hebert Date of Exam: 11/10/2018 Medical Rec #: [id number] Height: 69.0 in Accession #: [id number] Weight: 190.4 lb Date of Birth: 12/04/1960 BSA: 2.02 m Patient Age: 58 years BP: 126/88 mmHg Patient Gender: M HR: 66 bpm. Exam Location: [place]  Procedure: 2D Echo, Cardiac Doppler and Color Doppler  Indications: Murmur  History: Patient has no prior  history of Echocardiogram examinations. Aortic Valve Disease Signs/Symptoms:Chest Pain Risk Factors:Dyslipidemia.  Sonographer: Sinda Du RDCS (AE) Referring Phys: [id number] KARDIE TOBB  IMPRESSIONS   1. Left ventricular ejection fraction, by visual estimation, is 60 to 65%. The left ventricle has normal function. Normal left ventricular size. There is mildly increased left  ventricular hypertrophy. 2. Left ventricular diastolic Doppler parameters are consistent with impaired relaxation pattern of LV diastolic filling. 3. Global right ventricle has normal systolic function.The right ventricular size is normal. No increase in right ventricular wall thickness. 4. Left atrial size was normal. 5. Right atrial size was normal. 6. Mild to moderate mitral annular calcification. 7. The mitral valve is normal in structure. No evidence of mitral valve regurgitation. No evidence of mitral stenosis. 8. The tricuspid valve is normal in structure. Tricuspid valve regurgitation was not visualized by color flow Doppler. 9. The aortic valve is bicuspid Aortic valve regurgitation is mild by color flow Doppler. Severe aortic valve stenosis. 10. The pulmonic valve was normal in structure. Pulmonic valve regurgitation is not visualized by color flow Doppler. 11. There is mild to moderate dilatation of the ascending aorta measuring 44 mm. 12. Moderately elevated pulmonary artery systolic pressure. 13. The inferior vena cava is normal in size with greater than 50% respiratory variability, suggesting right atrial pressure of 3 mmHg.  FINDINGS Left Ventricle: Left ventricular ejection fraction, by visual estimation, is 60 to 65%. The left ventricle has normal function. There is mildly increased left ventricular hypertrophy. Normal left ventricular size. Spectral Doppler shows Left ventricular diastolic Doppler parameters are consistent with impaired relaxation  pattern of LV diastolic filling.  Right Ventricle: The right ventricular size is normal. No increase in right ventricular wall thickness. Global RV systolic function is has normal systolic function. The tricuspid regurgitant velocity is 3.26 m/s, and with an assumed right atrial pressure of 3 mmHg, the estimated right ventricular systolic pressure is moderately elevated at 45.5 mmHg.  Left Atrium: Left atrial size was normal in size.  Right Atrium: Right atrial size was normal in size  Pericardium: There is no evidence of pericardial effusion.  Mitral Valve: The mitral valve is normal in structure. Mild to moderate mitral annular calcification. No evidence of mitral valve stenosis by observation. No evidence of mitral valve regurgitation.  Tricuspid Valve: The tricuspid valve is normal in structure. Tricuspid valve regurgitation was not visualized by color flow Doppler.  Aortic Valve: The aortic valve is bicuspid. Aortic valve regurgitation is mild by color flow Doppler. Aortic regurgitation PHT measures 220 msec. Severe aortic stenosis is present. Aortic valve mean gradient measures 72.2 mmHg. Aortic valve peak gradient measures 123.5 mmHg. Aortic valve area, by VTI measures 0.50 cm.  Pulmonic Valve: The pulmonic valve was normal in structure. Pulmonic valve regurgitation is not visualized by color flow Doppler.  Aorta: The aortic root, ascending aorta and aortic arch are all structurally normal, with no evidence of dilitation or obstruction. There is mild to moderate dilatation of the ascending aorta measuring 44 mm.  Venous: The inferior vena cava is normal in size with greater than 50% respiratory variability, suggesting right atrial pressure of 3 mmHg.  IAS/Shunts: No atrial level shunt detected by color flow Doppler. No ventricular septal defect is seen or detected. There is no evidence of an atrial septal defect.    LEFT VENTRICLE PLAX 2D LVIDd: 5.36 cm  Diastology LVIDs: 3.26 cm LV e' lateral: 6.64 cm/s LV PW: 1.53 cm LV E/e' lateral: 13.7 LV IVS: 1.14 cm LV e' medial: 5.55 cm/s LVOT diam: 2.10 cm LV E/e' medial: 16.4 LV SV: 96 ml LV SV Index: 46.43 LVOT Area: 3.46 cm   RIGHT VENTRICLE IVC RV Basal diam: 2.94 cm IVC diam: 1.00 cm RV S prime: 10.70 cm/s  LEFT ATRIUM Index RIGHT ATRIUM Index LA diam: 3.60 cm  1.78 cm/m RA Area: 18.10 cm LA Vol (A2C): 66.6 ml 32.92 ml/m RA Volume: 51.70 ml 25.55 ml/m LA Vol (A4C): 48.6 ml 24.02 ml/m LA Biplane Vol: 57.2 ml 28.27 ml/m AORTIC VALVE AV Area (Vmax): 0.44 cm AV Area (Vmean): 0.43 cm AV Area (VTI): 0.50 cm AV Vmax: 555.67 cm/s AV Vmean: 399.200 cm/s AV VTI: 1.424 m AV Peak Grad: 123.5 mmHg AV Mean Grad: 72.2 mmHg LVOT Vmax: 70.00 cm/s LVOT Vmean: 49.900 cm/s LVOT VTI: 0.207 m LVOT/AV VTI ratio: 0.15 AI PHT: 220 msec  AORTA Ao Root diam: 3.30 cm Ao Asc diam: 4.40 cm  MITRAL VALVE TRICUSPID VALVE MV Area (PHT): 3.83 cm TR Peak grad: 42.5 mmHg MV PHT: 57.42 msec TR Vmax: 326.00 cm/s MV Decel Time: 198 msec MV E velocity: 90.80 cm/s 103 cm/s SHUNTS MV A velocity: 66.70 cm/s 70.3 cm/s Systemic VTI: 0.21 m MV E/A ratio: 1.36 1.5 Systemic Diam: 2.10 cm   Belva Crome MD Electronically signed by Belva Crome MD Signature Date/Time: 11/10/2018/1:16:00 PM     Physicians  Panel Physicians Referring Physician Case Authorizing Physician  Corky Crafts, MD (Primary)    Procedures  RIGHT/LEFT HEART CATH AND CORONARY ANGIOGRAPHY  Conclusion    Mid LAD lesion is 25% stenosed.  Hemodynamic findings consistent with mild pulmonary hypertension. Volume overload based  on PCWP.  Ao sat 98%, PA sat 72%, CO 4.8 L/min; CI 2.37; mean PA 31 mm Hg; mean PCWP 27 mm Hg  Severely calcified aortic valve.  No significant CAD. Aortic valve was not crossed, per Dr. Servando Salina.  Continue with plans for aortic valve replacement.    Indications  Severe aortic valve stenosis [I35.0 (ICD-10-CM)]  Procedural Details  Technical Details The risks, benefits, and details of the procedure were explained to the patient. The patient verbalized understanding and wanted to proceed. Informed written consent was obtained.  PROCEDURE TECHNIQUE: After Xylocaine anesthesia, a 5 French sheath was placed in the right brachial in exchange for a peripheral IV. A 5 French balloontipped Swan-Ganz catheter was advanced to the pulmonary artery under fluoroscopic guidance. Hemodynamic pressures were obtained. Oxygen saturations were obtained. After Xylocaine anesthesia, a 41F sheath was placed in the right radial artery with a single anterior needle wall stick. Left coronary angiography was done using a Judkins L4 guide catheter. Right coronary angiography was done using a Judkins R4 guide catheter. Left heart cath was not done.     Contrast: 55 cc    Estimated blood loss <50 mL.   During this procedure medications were administered to achieve and maintain moderate conscious sedation while the patient's heart rate, blood pressure, and oxygen saturation were continuously monitored and I was present face-to-face 100% of this time.  Medications (Filter: Administrations occurring from 11/22/18 0721 to 11/22/18 0819)         Medication Rate/Dose/Volume Action  Date Time   midazolam (VERSED) injection (mg) 2 mg Given 11/22/18 0740   Total dose as of 11/22/18 0819        2 mg        fentaNYL (SUBLIMAZE) injection (mcg) 25 mcg Given 11/22/18 0740   Total dose as of 11/22/18 0819        25 mcg        lidocaine (PF) (XYLOCAINE) 1 % injection (mL) 2 mL Given  11/22/18 0752   Total dose as of 11/22/18 0819 2 mL Given 0754   4 mL        Heparin (Porcine) in NaCl 1000-0.9 UT/500ML-%  SOLN (mL) 500 mL Given 11/22/18 0752   Total dose as of 11/22/18 0819 500 mL Given 0752   1,000 mL        Radial Cocktail/Verapamil only (mL) 10 mL Given 11/22/18 0756   Total dose as of 11/22/18 0819        10 mL        heparin injection (Units) 4,500 Units Given 11/22/18 0805   Total dose as of 11/22/18 0819        4,500 Units        iohexol (OMNIPAQUE) 350 MG/ML injection (mL) 55 mL Given 11/22/18 0812   Total dose as of 11/22/18 0819        55 mL        Sedation Time  Sedation Time Physician-1: 31 minutes 10 seconds  Contrast  Medication Name Total Dose  iohexol (OMNIPAQUE) 350 MG/ML injection 55 mL    Radiation/Fluoro  Fluoro time: 2.7 (min) DAP: 13305 (mGycm2) Cumulative Air Kerma: 268 (mGy)  Complications  Complications documented before study signed (11/22/2018 8:26 AM)   No complications were associated with this study.  Documented by Corky Crafts, MD - 11/22/2018 8:24 AM    Coronary Findings  Diagnostic Dominance: Right Left Anterior Descending  Mid LAD lesion 25% stenosed  Mid LAD lesion is 25% stenosed.  Right Coronary Artery  Vessel is large.  Intervention  No interventions have been documented. Right Heart  Right Heart Pressures Hemodynamic findings consistent with mild pulmonary hypertension. Ao sat 98%, PA sat 72%, CO 4.8 L/min; CI 2.37; mean PA 31 mm Hg; mean PCWP 27 mm Hg  Right Atrium Right atrial pressure is elevated.  Left Heart  Aortic Valve The aortic valve is calcified. Known aortic stenosis  Coronary Diagrams  Diagnostic Dominance: Right  Intervention  Implants      No implant documentation for this case.  Syngo Images  Show images for CARDIAC CATHETERIZATION  Images on Long Term Storage  Show images for Antwoine, Zorn  to Procedure Log  Procedure Log    Hemo Data   Most Recent Value  Fick Cardiac Output 4.8 L/min  Fick Cardiac Output Index 2.37 (L/min)/BSA  RA A Wave 20 mmHg  RA V Wave 19 mmHg  RA Mean 17 mmHg  RV Systolic Pressure 43 mmHg  RV Diastolic Pressure 14 mmHg  RV EDP 19 mmHg  PA Systolic Pressure 40 mmHg  PA Diastolic Pressure 26 mmHg  PA Mean 31 mmHg  PW A Wave 29 mmHg  PW V Wave 28 mmHg  PW Mean 27 mmHg  AO Systolic Pressure 112 mmHg  AO Diastolic Pressure 79 mmHg  AO Mean 94 mmHg  QP/QS 1  TPVR Index 13.1 HRUI  TSVR Index 39.74 HRUI  PVR SVR Ratio 0.05  TPVR/TSVR Ratio 0.33    Impression:  This 58 year old gentleman has stage D, critical, symptomatic bicuspid aortic valve stenosis with NYHA class lll symptoms of exertional fatigue and shortness of breath as well as exertional chest discomfort consistent with chronic diastolic congestive heart failure. I have personally reviewed his 2D echo and cardiac cath. His echo shows a severely calcified bicuspid aortic valve with a mean gradient of 72 mm Hg and an AVA of 0.5 cm2 consistent with critical AS. Cath shows mild insignificant LAD stenosis, mild pulmonary HTN and a mean PCW pressure of 27 mm Hg. I agree that AVR is indicated in this patient. I don't think he is a candidate for TAVR  due to his young age. I think open surgical AVR is the best treatment for him. I discussed the alternatives of mechanical and bioprosthetic valves including the pros and cons of both. He would like to use a bioprosthetic valve so that he does not need to be on Coumadin. Given his age of 64 I think it is a reasonable alternative since the current generation of Edwards pericardial valve has very good longevity and would allow TAVR in the future if he did have significant structural deterioration. 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 I don't think that this degree of dilatation  requires replacement of the ascending aorta.  I discussed the operative procedure with the patient and his wife including alternatives, benefits and risks; including but not limited to bleeding, blood transfusion, infection, stroke, myocardial infarction, graft failure, heart block requiring a permanent pacemaker, organ dysfunction, and death.  Griffin Dakin understands and agrees to proceed.    Plan:  Aortic valve replacement using a bioprosthetic aortic valve.  Gaye Pollack, MD [facility] [phone]

## 2018-12-08 ENCOUNTER — Inpatient Hospital Stay (HOSPITAL_COMMUNITY): Payer: 59

## 2018-12-08 ENCOUNTER — Inpatient Hospital Stay (HOSPITAL_COMMUNITY)
Admission: RE | Admit: 2018-12-08 | Discharge: 2018-12-13 | DRG: 219 | Disposition: A | Discharge status: Home / Self Care | Payer: 59 | Attending: Surgery | Admitting: Surgery

## 2018-12-08 ENCOUNTER — Inpatient Hospital Stay (HOSPITAL_COMMUNITY): Payer: 59 | Admitting: Anesthesiology

## 2018-12-08 ENCOUNTER — Other Ambulatory Visit: Payer: Self-pay

## 2018-12-08 ENCOUNTER — Encounter (HOSPITAL_COMMUNITY): Payer: Self-pay | Admitting: *Deleted

## 2018-12-08 ENCOUNTER — Encounter (HOSPITAL_COMMUNITY)
Admission: RE | Disposition: A | Discharge status: Home / Self Care | Payer: Self-pay | Source: Home / Self Care | Attending: Surgery

## 2018-12-08 ENCOUNTER — Inpatient Hospital Stay (HOSPITAL_COMMUNITY): Payer: 59 | Admitting: Vascular Surgery

## 2018-12-08 DIAGNOSIS — I350 Nonrheumatic aortic (valve) stenosis: Principal | ICD-10-CM | POA: Diagnosis present

## 2018-12-08 DIAGNOSIS — I454 Nonspecific intraventricular block: Secondary | ICD-10-CM | POA: Diagnosis not present

## 2018-12-08 DIAGNOSIS — Q231 Congenital insufficiency of aortic valve: Secondary | ICD-10-CM

## 2018-12-08 DIAGNOSIS — F17210 Nicotine dependence, cigarettes, uncomplicated: Secondary | ICD-10-CM | POA: Diagnosis present

## 2018-12-08 DIAGNOSIS — Z79899 Other long term (current) drug therapy: Secondary | ICD-10-CM | POA: Diagnosis not present

## 2018-12-08 DIAGNOSIS — E876 Hypokalemia: Secondary | ICD-10-CM | POA: Diagnosis not present

## 2018-12-08 DIAGNOSIS — I4892 Unspecified atrial flutter: Secondary | ICD-10-CM | POA: Diagnosis not present

## 2018-12-08 DIAGNOSIS — R000 Tachycardia, unspecified: Secondary | ICD-10-CM | POA: Diagnosis not present

## 2018-12-08 DIAGNOSIS — S2501XA Minor laceration of thoracic aorta, initial encounter: Secondary | ICD-10-CM | POA: Diagnosis not present

## 2018-12-08 DIAGNOSIS — R0789 Other chest pain: Secondary | ICD-10-CM | POA: Diagnosis present

## 2018-12-08 DIAGNOSIS — Z7982 Long term (current) use of aspirin: Secondary | ICD-10-CM | POA: Diagnosis not present

## 2018-12-08 DIAGNOSIS — R1310 Dysphagia, unspecified: Secondary | ICD-10-CM | POA: Diagnosis not present

## 2018-12-08 DIAGNOSIS — E782 Mixed hyperlipidemia: Secondary | ICD-10-CM | POA: Diagnosis present

## 2018-12-08 DIAGNOSIS — I441 Atrioventricular block, second degree: Secondary | ICD-10-CM | POA: Diagnosis present

## 2018-12-08 DIAGNOSIS — K219 Gastro-esophageal reflux disease without esophagitis: Secondary | ICD-10-CM | POA: Diagnosis present

## 2018-12-08 DIAGNOSIS — Y838 Other surgical procedures as the cause of abnormal reaction of the patient, or of later complication, without mention of misadventure at the time of the procedure: Secondary | ICD-10-CM | POA: Diagnosis not present

## 2018-12-08 DIAGNOSIS — I712 Thoracic aortic aneurysm, without rupture: Secondary | ICD-10-CM | POA: Diagnosis present

## 2018-12-08 DIAGNOSIS — Z833 Family history of diabetes mellitus: Secondary | ICD-10-CM | POA: Diagnosis not present

## 2018-12-08 DIAGNOSIS — I9751 Accidental puncture and laceration of a circulatory system organ or structure during a circulatory system procedure: Secondary | ICD-10-CM | POA: Diagnosis not present

## 2018-12-08 DIAGNOSIS — Z8249 Family history of ischemic heart disease and other diseases of the circulatory system: Secondary | ICD-10-CM | POA: Diagnosis not present

## 2018-12-08 DIAGNOSIS — Z20828 Contact with and (suspected) exposure to other viral communicable diseases: Secondary | ICD-10-CM | POA: Diagnosis present

## 2018-12-08 DIAGNOSIS — Z952 Presence of prosthetic heart valve: Secondary | ICD-10-CM

## 2018-12-08 DIAGNOSIS — Z95828 Presence of other vascular implants and grafts: Secondary | ICD-10-CM

## 2018-12-08 DIAGNOSIS — Z9889 Other specified postprocedural states: Secondary | ICD-10-CM

## 2018-12-08 DIAGNOSIS — Z883 Allergy status to other anti-infective agents status: Secondary | ICD-10-CM | POA: Diagnosis not present

## 2018-12-08 DIAGNOSIS — I4819 Other persistent atrial fibrillation: Secondary | ICD-10-CM | POA: Diagnosis not present

## 2018-12-08 DIAGNOSIS — I272 Pulmonary hypertension, unspecified: Secondary | ICD-10-CM | POA: Diagnosis present

## 2018-12-08 DIAGNOSIS — Z23 Encounter for immunization: Secondary | ICD-10-CM

## 2018-12-08 HISTORY — PX: REPLACEMENT ASCENDING AORTA: SHX6068

## 2018-12-08 HISTORY — PX: TEE WITHOUT CARDIOVERSION: SHX5443

## 2018-12-08 HISTORY — PX: AORTIC VALVE REPLACEMENT: SHX41

## 2018-12-08 HISTORY — DX: Presence of prosthetic heart valve: Z95.2

## 2018-12-08 LAB — POCT I-STAT, CHEM 8
BUN: 16 mg/dL (ref 6–20)
Calcium, Ion: 1.15 mmol/L (ref 1.15–1.40)
Chloride: 108 mmol/L (ref 98–111)
Creatinine, Ser: 0.7 mg/dL (ref 0.61–1.24)
Glucose, Bld: 113 mg/dL — ABNORMAL HIGH (ref 70–99)
HCT: 33 % — ABNORMAL LOW (ref 39.0–52.0)
Hemoglobin: 11.2 g/dL — ABNORMAL LOW (ref 13.0–17.0)
Potassium: 4.1 mmol/L (ref 3.5–5.1)
Sodium: 142 mmol/L (ref 135–145)
TCO2: 22 mmol/L (ref 22–32)

## 2018-12-08 LAB — POCT I-STAT 7, (LYTES, BLD GAS, ICA,H+H)
Acid-base deficit: 1 mmol/L (ref 0.0–2.0)
Bicarbonate: 25.1 mmol/L (ref 20.0–28.0)
Bicarbonate: 24.9 mmol/L (ref 20.0–28.0)
Calcium, Ion: 1.15 mmol/L (ref 1.15–1.40)
Calcium, Ion: 1.17 mmol/L (ref 1.15–1.40)
HCT: 34 % — ABNORMAL LOW (ref 39.0–52.0)
HCT: 33 % — ABNORMAL LOW (ref 39.0–52.0)
Hemoglobin: 11.6 g/dL — ABNORMAL LOW (ref 13.0–17.0)
Hemoglobin: 11.2 g/dL — ABNORMAL LOW (ref 13.0–17.0)
O2 Saturation: 93 %
O2 Saturation: 93 %
Patient temperature: 36.5
Patient temperature: 36.8
Potassium: 4.5 mmol/L (ref 3.5–5.1)
Potassium: 4.5 mmol/L (ref 3.5–5.1)
Sodium: 144 mmol/L (ref 135–145)
Sodium: 143 mmol/L (ref 135–145)
TCO2: 26 mmol/L (ref 22–32)
TCO2: 26 mmol/L (ref 22–32)
pCO2 arterial: 42.1 mmHg (ref 32.0–48.0)
pCO2 arterial: 44.2 mmHg (ref 32.0–48.0)
pH, Arterial: 7.38 (ref 7.350–7.450)
pH, Arterial: 7.357 (ref 7.350–7.450)
pO2, Arterial: 67 mmHg — ABNORMAL LOW (ref 83.0–108.0)
pO2, Arterial: 68 mmHg — ABNORMAL LOW (ref 83.0–108.0)

## 2018-12-08 LAB — CBC WITH DIFFERENTIAL/PLATELET
Abs Immature Granulocytes: 0.06 10*3/uL (ref 0.00–0.07)
Basophils Absolute: 0 10*3/uL (ref 0.0–0.1)
Basophils Relative: 0 %
Eosinophils Absolute: 0 10*3/uL (ref 0.0–0.5)
Eosinophils Relative: 0 %
HCT: 33.2 % — ABNORMAL LOW (ref 39.0–52.0)
Hemoglobin: 11.4 g/dL — ABNORMAL LOW (ref 13.0–17.0)
Immature Granulocytes: 1 %
Lymphocytes Relative: 5 %
Lymphs Abs: 0.6 10*3/uL — ABNORMAL LOW (ref 0.7–4.0)
MCH: 32.1 pg (ref 26.0–34.0)
MCHC: 34.3 g/dL (ref 30.0–36.0)
MCV: 93.5 fL (ref 80.0–100.0)
Monocytes Absolute: 0.6 10*3/uL (ref 0.1–1.0)
Monocytes Relative: 5 %
Neutro Abs: 11.1 10*3/uL — ABNORMAL HIGH (ref 1.7–7.7)
Neutrophils Relative %: 89 %
Platelets: 131 10*3/uL — ABNORMAL LOW (ref 150–400)
RBC: 3.55 MIL/uL — ABNORMAL LOW (ref 4.22–5.81)
RDW: 13.5 % (ref 11.5–15.5)
WBC: 12.4 10*3/uL — ABNORMAL HIGH (ref 4.0–10.5)
nRBC: 0 % (ref 0.0–0.2)

## 2018-12-08 LAB — APTT: aPTT: 36 seconds (ref 24–36)

## 2018-12-08 LAB — PROTIME-INR
INR: 1.4 — ABNORMAL HIGH (ref 0.8–1.2)
Prothrombin Time: 17.1 seconds — ABNORMAL HIGH (ref 11.4–15.2)

## 2018-12-08 LAB — GLUCOSE, CAPILLARY
Glucose-Capillary: 50 mg/dL — ABNORMAL LOW (ref 70–99)
Glucose-Capillary: 91 mg/dL (ref 70–99)
Glucose-Capillary: 128 mg/dL — ABNORMAL HIGH (ref 70–99)
Glucose-Capillary: 111 mg/dL — ABNORMAL HIGH (ref 70–99)
Glucose-Capillary: 105 mg/dL — ABNORMAL HIGH (ref 70–99)
Glucose-Capillary: 103 mg/dL — ABNORMAL HIGH (ref 70–99)
Glucose-Capillary: 109 mg/dL — ABNORMAL HIGH (ref 70–99)
Glucose-Capillary: 118 mg/dL — ABNORMAL HIGH (ref 70–99)
Glucose-Capillary: 103 mg/dL — ABNORMAL HIGH (ref 70–99)
Glucose-Capillary: 103 mg/dL — ABNORMAL HIGH (ref 70–99)

## 2018-12-08 LAB — ECHO INTRAOPERATIVE TEE
Height: 69 in
Weight: 3004.82 oz

## 2018-12-08 LAB — CBC
HCT: 34.2 % — ABNORMAL LOW (ref 39.0–52.0)
Hemoglobin: 11.6 g/dL — ABNORMAL LOW (ref 13.0–17.0)
MCH: 32 pg (ref 26.0–34.0)
MCHC: 33.9 g/dL (ref 30.0–36.0)
MCV: 94.2 fL (ref 80.0–100.0)
Platelets: 127 10*3/uL — ABNORMAL LOW (ref 150–400)
RBC: 3.63 MIL/uL — ABNORMAL LOW (ref 4.22–5.81)
RDW: 13.3 % (ref 11.5–15.5)
WBC: 14.2 10*3/uL — ABNORMAL HIGH (ref 4.0–10.5)
nRBC: 0 % (ref 0.0–0.2)

## 2018-12-08 LAB — HEMOGLOBIN AND HEMATOCRIT, BLOOD
HCT: 29.6 % — ABNORMAL LOW (ref 39.0–52.0)
Hemoglobin: 10.1 g/dL — ABNORMAL LOW (ref 13.0–17.0)

## 2018-12-08 LAB — FIBRINOGEN: Fibrinogen: 186 mg/dL — ABNORMAL LOW (ref 210–475)

## 2018-12-08 LAB — PLATELET COUNT: Platelets: 137 10*3/uL — ABNORMAL LOW (ref 150–400)

## 2018-12-08 LAB — PREPARE RBC (CROSSMATCH)

## 2018-12-08 SURGERY — REPLACEMENT, AORTIC VALVE, OPEN
Anesthesia: General | Site: Chest

## 2018-12-08 MED ORDER — ORAL CARE MOUTH RINSE
15.0000 mL | Freq: Two times a day (BID) | OROMUCOSAL | Status: DC
  Administered 2018-12-09 – 2018-12-13 (×6): 15 mL via OROMUCOSAL

## 2018-12-08 MED ORDER — ACETAMINOPHEN 160 MG/5ML PO SOLN
650.0000 mg | Freq: Once | ORAL | Status: AC
  Administered 2018-12-08: 650 mg

## 2018-12-08 MED ORDER — INSULIN REGULAR(HUMAN) IN NACL 100-0.9 UT/100ML-% IV SOLN: INTRAVENOUS | Status: DC

## 2018-12-08 MED ORDER — MAGNESIUM SULFATE 4 GM/100ML IV SOLN
4.0000 g | Freq: Once | INTRAVENOUS | Status: AC
  Administered 2018-12-08: 4 g via INTRAVENOUS

## 2018-12-08 MED ORDER — LACTATED RINGERS IV SOLN: INTRAVENOUS | Status: DC

## 2018-12-08 MED ORDER — ALBUMIN HUMAN 5 % IV SOLN
INTRAVENOUS | Status: DC | PRN
  Administered 2018-12-08 (×2): via INTRAVENOUS

## 2018-12-08 MED ORDER — ACETAMINOPHEN 500 MG PO TABS
1000.0000 mg | ORAL_TABLET | Freq: Four times a day (QID) | ORAL | Status: DC
  Administered 2018-12-09 – 2018-12-12 (×12): 1000 mg via ORAL
  Filled 2018-12-08 (×12): qty 2

## 2018-12-08 MED ORDER — ACETAMINOPHEN 160 MG/5ML PO SOLN: 1000.0000 mg | Freq: Four times a day (QID) | ORAL | Status: DC

## 2018-12-08 MED ORDER — PROTAMINE SULFATE 10 MG/ML IV SOLN
INTRAVENOUS | Status: AC
  Filled 2018-12-08: qty 5

## 2018-12-08 MED ORDER — CHLORHEXIDINE GLUCONATE 4 % EX LIQD: 30.0000 mL | CUTANEOUS | Status: DC

## 2018-12-08 MED ORDER — NITROGLYCERIN IN D5W 200-5 MCG/ML-% IV SOLN: 0.0000 ug/min | INTRAVENOUS | Status: DC

## 2018-12-08 MED ORDER — SODIUM CHLORIDE 0.9 % IV SOLN
1.5000 g | Freq: Two times a day (BID) | INTRAVENOUS | Status: DC
  Administered 2018-12-08 – 2018-12-09 (×2): 1.5 g via INTRAVENOUS
  Filled 2018-12-08 (×4): qty 1.5

## 2018-12-08 MED ORDER — MIDAZOLAM HCL 2 MG/2ML IJ SOLN
INTRAMUSCULAR | Status: AC
  Filled 2018-12-08: qty 2

## 2018-12-08 MED ORDER — METOPROLOL TARTRATE 25 MG/10 ML ORAL SUSPENSION: 12.5000 mg | Freq: Two times a day (BID) | ORAL | Status: DC

## 2018-12-08 MED ORDER — CHLORHEXIDINE GLUCONATE 0.12 % MT SOLN
15.0000 mL | Freq: Once | OROMUCOSAL | Status: AC
  Administered 2018-12-08: 15 mL via OROMUCOSAL
  Filled 2018-12-08: qty 15

## 2018-12-08 MED ORDER — SODIUM CHLORIDE 0.9% FLUSH
3.0000 mL | Freq: Two times a day (BID) | INTRAVENOUS | Status: DC
  Administered 2018-12-09: 3 mL via INTRAVENOUS
  Administered 2018-12-10: 22:00:00 10 mL via INTRAVENOUS
  Administered 2018-12-10: 5 mL via INTRAVENOUS
  Administered 2018-12-10 – 2018-12-11 (×2): 3 mL via INTRAVENOUS

## 2018-12-08 MED ORDER — HEMOSTATIC AGENTS (NO CHARGE) OPTIME
TOPICAL | Status: DC | PRN
  Administered 2018-12-08: 1 via TOPICAL

## 2018-12-08 MED ORDER — ALBUMIN HUMAN 5 % IV SOLN: 250.0000 mL | INTRAVENOUS | Status: DC | PRN

## 2018-12-08 MED ORDER — CHLORHEXIDINE GLUCONATE 0.12% ORAL RINSE (MEDLINE KIT): 15.0000 mL | Freq: Two times a day (BID) | OROMUCOSAL | Status: DC

## 2018-12-08 MED ORDER — METHYLPREDNISOLONE SODIUM SUCC 125 MG IJ SOLR
INTRAMUSCULAR | Status: DC | PRN
  Administered 2018-12-08: 125 mg via INTRAVENOUS

## 2018-12-08 MED ORDER — ORAL CARE MOUTH RINSE: 15.0000 mL | OROMUCOSAL | Status: DC

## 2018-12-08 MED ORDER — ONDANSETRON HCL 4 MG/2ML IJ SOLN
INTRAMUSCULAR | Status: AC
  Filled 2018-12-08: qty 2

## 2018-12-08 MED ORDER — SODIUM CHLORIDE 0.9% FLUSH: 10.0000 mL | INTRAVENOUS | Status: DC | PRN

## 2018-12-08 MED ORDER — DEXMEDETOMIDINE HCL IN NACL 400 MCG/100ML IV SOLN
0.0000 ug/kg/h | INTRAVENOUS | Status: DC
  Administered 2018-12-08: 0.7 ug/kg/h via INTRAVENOUS
  Filled 2018-12-08: qty 100

## 2018-12-08 MED ORDER — BISACODYL 5 MG PO TBEC
10.0000 mg | DELAYED_RELEASE_TABLET | Freq: Every day | ORAL | Status: DC
  Administered 2018-12-09 – 2018-12-13 (×5): 10 mg via ORAL
  Filled 2018-12-08 (×5): qty 2

## 2018-12-08 MED ORDER — ARTIFICIAL TEARS OPHTHALMIC OINT
TOPICAL_OINTMENT | OPHTHALMIC | Status: AC
  Filled 2018-12-08: qty 3.5

## 2018-12-08 MED ORDER — ROCURONIUM BROMIDE 50 MG/5ML IV SOSY
PREFILLED_SYRINGE | INTRAVENOUS | Status: DC | PRN
  Administered 2018-12-08 (×2): 50 mg via INTRAVENOUS
  Administered 2018-12-08: 100 mg via INTRAVENOUS
  Administered 2018-12-08: 50 mg via INTRAVENOUS

## 2018-12-08 MED ORDER — THROMBIN 20000 UNITS EX SOLR
CUTANEOUS | Status: DC | PRN
  Administered 2018-12-08: 07:00:00 20000 [IU] via TOPICAL

## 2018-12-08 MED ORDER — PROTAMINE SULFATE 10 MG/ML IV SOLN
INTRAVENOUS | Status: DC | PRN
  Administered 2018-12-08: 100 mg via INTRAVENOUS
  Administered 2018-12-08: 50 mg via INTRAVENOUS
  Administered 2018-12-08: 30 mg via INTRAVENOUS
  Administered 2018-12-08: 120 mg via INTRAVENOUS

## 2018-12-08 MED ORDER — ROCURONIUM BROMIDE 10 MG/ML (PF) SYRINGE
PREFILLED_SYRINGE | INTRAVENOUS | Status: AC
  Filled 2018-12-08: qty 10

## 2018-12-08 MED ORDER — ONDANSETRON HCL 4 MG/2ML IJ SOLN: 4.0000 mg | Freq: Four times a day (QID) | INTRAMUSCULAR | Status: DC | PRN

## 2018-12-08 MED ORDER — LACTATED RINGERS IV SOLN
INTRAVENOUS | Status: DC | PRN
  Administered 2018-12-08: 07:00:00 via INTRAVENOUS

## 2018-12-08 MED ORDER — PROPOFOL 10 MG/ML IV BOLUS
INTRAVENOUS | Status: DC | PRN
  Administered 2018-12-08: 50 mg via INTRAVENOUS
  Administered 2018-12-08: 100 mg via INTRAVENOUS

## 2018-12-08 MED ORDER — SODIUM CHLORIDE 0.9 % IV SOLN
INTRAVENOUS | Status: DC
  Administered 2018-12-08: 15:00:00 via INTRAVENOUS

## 2018-12-08 MED ORDER — OXYCODONE HCL 5 MG PO TABS
5.0000 mg | ORAL_TABLET | ORAL | Status: DC | PRN
  Administered 2018-12-08 – 2018-12-09 (×2): 5 mg via ORAL
  Administered 2018-12-09: 10 mg via ORAL
  Administered 2018-12-09: 5 mg via ORAL
  Administered 2018-12-09 – 2018-12-12 (×4): 10 mg via ORAL
  Filled 2018-12-08: qty 1
  Filled 2018-12-08 (×3): qty 2
  Filled 2018-12-08 (×2): qty 1
  Filled 2018-12-08: qty 2

## 2018-12-08 MED ORDER — POTASSIUM CHLORIDE 10 MEQ/50ML IV SOLN: 10.0000 meq | INTRAVENOUS | Status: AC

## 2018-12-08 MED ORDER — SODIUM CHLORIDE 0.9% IV SOLUTION: Freq: Once | INTRAVENOUS | Status: DC

## 2018-12-08 MED ORDER — CHLORHEXIDINE GLUCONATE CLOTH 2 % EX PADS
6.0000 | MEDICATED_PAD | Freq: Every day | CUTANEOUS | Status: DC
  Administered 2018-12-08 – 2018-12-13 (×5): 6 via TOPICAL

## 2018-12-08 MED ORDER — TRAMADOL HCL 50 MG PO TABS
50.0000 mg | ORAL_TABLET | ORAL | Status: DC | PRN
  Administered 2018-12-11: 100 mg via ORAL
  Administered 2018-12-11: 10:00:00 50 mg via ORAL
  Administered 2018-12-11: 21:00:00 100 mg via ORAL
  Filled 2018-12-08 (×2): qty 2
  Filled 2018-12-08: qty 1

## 2018-12-08 MED ORDER — ATORVASTATIN CALCIUM 10 MG PO TABS
20.0000 mg | ORAL_TABLET | Freq: Every day | ORAL | Status: DC
  Administered 2018-12-09 – 2018-12-12 (×4): 20 mg via ORAL
  Filled 2018-12-08 (×4): qty 2

## 2018-12-08 MED ORDER — CHLORHEXIDINE GLUCONATE 0.12 % MT SOLN
15.0000 mL | Freq: Two times a day (BID) | OROMUCOSAL | Status: DC
  Administered 2018-12-08 – 2018-12-13 (×7): 15 mL via OROMUCOSAL
  Filled 2018-12-08 (×7): qty 15

## 2018-12-08 MED ORDER — INSULIN REGULAR BOLUS VIA INFUSION
0.0000 [IU] | Freq: Three times a day (TID) | INTRAVENOUS | Status: DC
  Filled 2018-12-08: qty 10

## 2018-12-08 MED ORDER — METHYLPREDNISOLONE SODIUM SUCC 125 MG IJ SOLR
125.0000 mg | INTRAMUSCULAR | Status: DC
  Filled 2018-12-08: qty 2

## 2018-12-08 MED ORDER — 0.9 % SODIUM CHLORIDE (POUR BTL) OPTIME
TOPICAL | Status: DC | PRN
  Administered 2018-12-08: 12:00:00 1000 mL
  Administered 2018-12-08: 07:00:00 5000 mL

## 2018-12-08 MED ORDER — CHLORHEXIDINE GLUCONATE 0.12 % MT SOLN
15.0000 mL | OROMUCOSAL | Status: AC
  Administered 2018-12-08: 15 mL via OROMUCOSAL

## 2018-12-08 MED ORDER — MORPHINE SULFATE (PF) 2 MG/ML IV SOLN
1.0000 mg | INTRAVENOUS | Status: DC | PRN
  Administered 2018-12-09: 2 mg via INTRAVENOUS
  Filled 2018-12-08: qty 1

## 2018-12-08 MED ORDER — PHENYLEPHRINE 40 MCG/ML (10ML) SYRINGE FOR IV PUSH (FOR BLOOD PRESSURE SUPPORT)
PREFILLED_SYRINGE | INTRAVENOUS | Status: AC
  Filled 2018-12-08: qty 10

## 2018-12-08 MED ORDER — DOCUSATE SODIUM 100 MG PO CAPS
200.0000 mg | ORAL_CAPSULE | Freq: Every day | ORAL | Status: DC
  Administered 2018-12-09 – 2018-12-12 (×4): 200 mg via ORAL
  Filled 2018-12-08 (×5): qty 2

## 2018-12-08 MED ORDER — PHENYLEPHRINE HCL-NACL 20-0.9 MG/250ML-% IV SOLN: 0.0000 ug/min | INTRAVENOUS | Status: DC

## 2018-12-08 MED ORDER — SODIUM CHLORIDE 0.9 % IV SOLN
250.0000 mL | INTRAVENOUS | Status: DC
  Administered 2018-12-10: 35 mL via INTRAVENOUS

## 2018-12-08 MED ORDER — MIDAZOLAM HCL (PF) 10 MG/2ML IJ SOLN
INTRAMUSCULAR | Status: AC
  Filled 2018-12-08: qty 2

## 2018-12-08 MED ORDER — VANCOMYCIN HCL IN DEXTROSE 1-5 GM/200ML-% IV SOLN
1000.0000 mg | Freq: Once | INTRAVENOUS | Status: AC
  Administered 2018-12-08: 20:00:00 1000 mg via INTRAVENOUS
  Filled 2018-12-08: qty 200

## 2018-12-08 MED ORDER — HEPARIN SODIUM (PORCINE) 1000 UNIT/ML IJ SOLN
INTRAMUSCULAR | Status: AC
  Filled 2018-12-08: qty 1

## 2018-12-08 MED ORDER — SODIUM CHLORIDE 0.9 % IV SOLN
INTRAVENOUS | Status: DC | PRN
  Administered 2018-12-08: 13:00:00 via INTRAVENOUS

## 2018-12-08 MED ORDER — PROPOFOL 10 MG/ML IV BOLUS
INTRAVENOUS | Status: AC
  Filled 2018-12-08: qty 40

## 2018-12-08 MED ORDER — PHENYLEPHRINE 40 MCG/ML (10ML) SYRINGE FOR IV PUSH (FOR BLOOD PRESSURE SUPPORT)
PREFILLED_SYRINGE | INTRAVENOUS | Status: DC | PRN
  Administered 2018-12-08: 80 ug via INTRAVENOUS
  Administered 2018-12-08 (×4): 40 ug via INTRAVENOUS
  Administered 2018-12-08: 60 ug via INTRAVENOUS
  Administered 2018-12-08: 120 ug via INTRAVENOUS
  Administered 2018-12-08: 100 ug via INTRAVENOUS

## 2018-12-08 MED ORDER — BISACODYL 10 MG RE SUPP: 10.0000 mg | Freq: Every day | RECTAL | Status: DC

## 2018-12-08 MED ORDER — HEPARIN SODIUM (PORCINE) 1000 UNIT/ML IJ SOLN
INTRAMUSCULAR | Status: DC | PRN
  Administered 2018-12-08: 30 mL via INTRAVENOUS

## 2018-12-08 MED ORDER — MUPIROCIN 2 % EX OINT
1.0000 "application " | TOPICAL_OINTMENT | Freq: Two times a day (BID) | CUTANEOUS | Status: DC
  Administered 2018-12-08 – 2018-12-10 (×5): 1 via NASAL
  Filled 2018-12-08: qty 22

## 2018-12-08 MED ORDER — METOPROLOL TARTRATE 12.5 MG HALF TABLET
12.5000 mg | ORAL_TABLET | Freq: Once | ORAL | Status: AC
  Administered 2018-12-08: 12.5 mg via ORAL
  Filled 2018-12-08: qty 1

## 2018-12-08 MED ORDER — FENTANYL CITRATE (PF) 250 MCG/5ML IJ SOLN
INTRAMUSCULAR | Status: DC | PRN
  Administered 2018-12-08: 150 ug via INTRAVENOUS
  Administered 2018-12-08: 250 ug via INTRAVENOUS
  Administered 2018-12-08: 300 ug via INTRAVENOUS
  Administered 2018-12-08: 150 ug via INTRAVENOUS
  Administered 2018-12-08 (×2): 50 ug via INTRAVENOUS
  Administered 2018-12-08: 150 ug via INTRAVENOUS
  Administered 2018-12-08: 50 ug via INTRAVENOUS

## 2018-12-08 MED ORDER — ACETAMINOPHEN 650 MG RE SUPP: 650.0000 mg | Freq: Once | RECTAL | Status: AC

## 2018-12-08 MED ORDER — SODIUM CHLORIDE 0.45 % IV SOLN: INTRAVENOUS | Status: DC | PRN

## 2018-12-08 MED ORDER — METOPROLOL TARTRATE 12.5 MG HALF TABLET
12.5000 mg | ORAL_TABLET | Freq: Two times a day (BID) | ORAL | Status: DC
  Administered 2018-12-08: 12.5 mg via ORAL
  Filled 2018-12-08: qty 1

## 2018-12-08 MED ORDER — ONDANSETRON HCL 4 MG/2ML IJ SOLN
INTRAMUSCULAR | Status: DC | PRN
  Administered 2018-12-08: 4 mg via INTRAVENOUS

## 2018-12-08 MED ORDER — FENTANYL CITRATE (PF) 250 MCG/5ML IJ SOLN
INTRAMUSCULAR | Status: AC
  Filled 2018-12-08: qty 25

## 2018-12-08 MED ORDER — METOPROLOL TARTRATE 5 MG/5ML IV SOLN: 2.5000 mg | INTRAVENOUS | Status: DC | PRN

## 2018-12-08 MED ORDER — SODIUM CHLORIDE 0.9% FLUSH: 3.0000 mL | INTRAVENOUS | Status: DC | PRN

## 2018-12-08 MED ORDER — LACTATED RINGERS IV SOLN: 500.0000 mL | Freq: Once | INTRAVENOUS | Status: DC | PRN

## 2018-12-08 MED ORDER — MUPIROCIN 2 % EX OINT: 1.0000 "application " | TOPICAL_OINTMENT | Freq: Two times a day (BID) | CUTANEOUS | Status: DC

## 2018-12-08 MED ORDER — THROMBIN (RECOMBINANT) 20000 UNITS EX SOLR
CUTANEOUS | Status: AC
  Filled 2018-12-08: qty 20000

## 2018-12-08 MED ORDER — PANTOPRAZOLE SODIUM 40 MG PO TBEC
40.0000 mg | DELAYED_RELEASE_TABLET | Freq: Every day | ORAL | Status: DC
  Administered 2018-12-10 – 2018-12-13 (×4): 40 mg via ORAL
  Filled 2018-12-08 (×4): qty 1

## 2018-12-08 MED ORDER — FAMOTIDINE IN NACL 20-0.9 MG/50ML-% IV SOLN
20.0000 mg | Freq: Two times a day (BID) | INTRAVENOUS | Status: AC
  Administered 2018-12-08 (×2): 20 mg via INTRAVENOUS
  Filled 2018-12-08: qty 50

## 2018-12-08 MED ORDER — ASPIRIN EC 325 MG PO TBEC
325.0000 mg | DELAYED_RELEASE_TABLET | Freq: Every day | ORAL | Status: DC
  Administered 2018-12-09 – 2018-12-12 (×4): 325 mg via ORAL
  Filled 2018-12-08 (×5): qty 1

## 2018-12-08 MED ORDER — LACTATED RINGERS IV SOLN
INTRAVENOUS | Status: DC | PRN
  Administered 2018-12-08 (×2): via INTRAVENOUS

## 2018-12-08 MED ORDER — PHENYLEPHRINE HCL-NACL 20-0.9 MG/250ML-% IV SOLN
INTRAVENOUS | Status: DC | PRN
  Administered 2018-12-08: 40 ug/min via INTRAVENOUS

## 2018-12-08 MED ORDER — MIDAZOLAM HCL 2 MG/2ML IJ SOLN: 2.0000 mg | INTRAMUSCULAR | Status: DC | PRN

## 2018-12-08 MED ORDER — ASPIRIN 81 MG PO CHEW
324.0000 mg | CHEWABLE_TABLET | Freq: Every day | ORAL | Status: DC
  Administered 2018-12-13: 324 mg
  Filled 2018-12-08: qty 4

## 2018-12-08 MED ORDER — VASOPRESSIN 20 UNIT/ML IV SOLN
INTRAVENOUS | Status: AC
  Filled 2018-12-08: qty 1

## 2018-12-08 MED ORDER — MIDAZOLAM HCL 5 MG/5ML IJ SOLN
INTRAMUSCULAR | Status: DC | PRN
  Administered 2018-12-08: 3 mg via INTRAVENOUS
  Administered 2018-12-08: 1 mg via INTRAVENOUS
  Administered 2018-12-08: 5 mg via INTRAVENOUS
  Administered 2018-12-08: 1 mg via INTRAVENOUS
  Administered 2018-12-08: 2 mg via INTRAVENOUS

## 2018-12-08 MED ORDER — LACTATED RINGERS IV SOLN
INTRAVENOUS | Status: DC
  Administered 2018-12-08: 15:00:00 via INTRAVENOUS

## 2018-12-08 SURGICAL SUPPLY — 98 items
ADAPTER CARDIO PERF ANTE/RETRO (ADAPTER) ×6 IMPLANT
ADPR PRFSN 84XANTGRD RTRGD (ADAPTER) ×4
BAG DECANTER FOR FLEXI CONT (MISCELLANEOUS) ×4 IMPLANT
BIOPATCH WHT 1IN DISK W/4.0 H (GAUZE/BANDAGES/DRESSINGS) ×2 IMPLANT
BLADE 15 SAFETY STRL DISP (BLADE) ×2 IMPLANT
BLADE CLIPPER SURG (BLADE) ×4 IMPLANT
BLADE STERNUM SYSTEM 6 (BLADE) ×4 IMPLANT
BLADE SURG 15 STRL LF DISP TIS (BLADE) ×2 IMPLANT
BLADE SURG 15 STRL SS (BLADE) ×4
CANISTER SUCT 3000ML PPV (MISCELLANEOUS) ×4 IMPLANT
CANNULA GUNDRY RCSP 15FR (MISCELLANEOUS) ×4 IMPLANT
CANNULA SUMP PERICARDIAL (CANNULA) ×2 IMPLANT
CATH HEART VENT LEFT (CATHETERS) ×2 IMPLANT
CATH ROBINSON RED A/P 18FR (CATHETERS) ×12 IMPLANT
CATH THORACIC 36FR (CATHETERS) ×4 IMPLANT
CATH THORACIC 36FR RT ANG (CATHETERS) ×4 IMPLANT
CONN ST 1/4X3/8  BEN (MISCELLANEOUS) ×2
CONN ST 1/4X3/8 BEN (MISCELLANEOUS) IMPLANT
CONN Y 3/8X3/8X3/8  BEN (MISCELLANEOUS) ×2
CONN Y 3/8X3/8X3/8 BEN (MISCELLANEOUS) IMPLANT
CONT SPEC 4OZ CLIKSEAL STRL BL (MISCELLANEOUS) ×6 IMPLANT
COVER PROBE W GEL 5X96 (DRAPES) ×2 IMPLANT
COVER SURGICAL LIGHT HANDLE (MISCELLANEOUS) ×4 IMPLANT
COVER WAND RF STERILE (DRAPES) ×2 IMPLANT
DRAPE CARDIOVASCULAR INCISE (DRAPES) ×4
DRAPE SLUSH/WARMER DISC (DRAPES) ×4 IMPLANT
DRAPE SRG 135X102X78XABS (DRAPES) ×2 IMPLANT
DRSG COVADERM 4X14 (GAUZE/BANDAGES/DRESSINGS) ×4 IMPLANT
ELECT CAUTERY BLADE 6.4 (BLADE) ×4 IMPLANT
ELECT REM PT RETURN 9FT ADLT (ELECTROSURGICAL) ×8
ELECTRODE REM PT RTRN 9FT ADLT (ELECTROSURGICAL) ×4 IMPLANT
FELT TEFLON 1X6 (MISCELLANEOUS) ×10 IMPLANT
GAUZE SPONGE 4X4 12PLY STRL (GAUZE/BANDAGES/DRESSINGS) ×4 IMPLANT
GLOVE BIO SURGEON STRL SZ 6 (GLOVE) IMPLANT
GLOVE BIO SURGEON STRL SZ 6.5 (GLOVE) ×8 IMPLANT
GLOVE BIO SURGEON STRL SZ7 (GLOVE) ×6 IMPLANT
GLOVE BIO SURGEON STRL SZ7.5 (GLOVE) IMPLANT
GLOVE BIO SURGEONS STRL SZ 6.5 (GLOVE) ×8
GLOVE BIOGEL PI IND STRL 6.5 (GLOVE) IMPLANT
GLOVE BIOGEL PI IND STRL 7.0 (GLOVE) IMPLANT
GLOVE BIOGEL PI INDICATOR 6.5 (GLOVE) ×10
GLOVE BIOGEL PI INDICATOR 7.0 (GLOVE) ×6
GLOVE EUDERMIC 7 POWDERFREE (GLOVE) ×8 IMPLANT
GLOVE SURG SS PI 6.0 STRL IVOR (GLOVE) ×2 IMPLANT
GOWN STRL REUS W/ TWL LRG LVL3 (GOWN DISPOSABLE) ×8 IMPLANT
GOWN STRL REUS W/ TWL XL LVL3 (GOWN DISPOSABLE) ×2 IMPLANT
GOWN STRL REUS W/TWL LRG LVL3 (GOWN DISPOSABLE) ×32
GOWN STRL REUS W/TWL XL LVL3 (GOWN DISPOSABLE) ×4
GRAFT HEMASHIELD 28X40 (Vascular Products) ×4 IMPLANT
GRAFT HEMASHIELD 28X50 (Vascular Products) IMPLANT
HEMOSTAT POWDER SURGIFOAM 1G (HEMOSTASIS) ×12 IMPLANT
HEMOSTAT SURGICEL 2X14 (HEMOSTASIS) ×4 IMPLANT
INSERT FOGARTY XLG (MISCELLANEOUS) ×4 IMPLANT
IV CATH 22GX1 FEP (IV SOLUTION) ×4 IMPLANT
KIT BASIN OR (CUSTOM PROCEDURE TRAY) ×4 IMPLANT
KIT CATH CPB BARTLE (MISCELLANEOUS) ×4 IMPLANT
KIT SUCTION CATH 14FR (SUCTIONS) ×6 IMPLANT
KIT TURNOVER KIT B (KITS) ×4 IMPLANT
LINE VENT (MISCELLANEOUS) ×2 IMPLANT
NS IRRIG 1000ML POUR BTL (IV SOLUTION) ×24 IMPLANT
PACK E OPEN HEART (SUTURE) ×4 IMPLANT
PACK OPEN HEART (CUSTOM PROCEDURE TRAY) ×4 IMPLANT
PAD ARMBOARD 7.5X6 YLW CONV (MISCELLANEOUS) ×8 IMPLANT
POSITIONER HEAD DONUT 9IN (MISCELLANEOUS) ×4 IMPLANT
SEALANT SURG COSEAL 8ML (VASCULAR PRODUCTS) ×2 IMPLANT
SET CARDIOPLEGIA MPS 5001102 (MISCELLANEOUS) ×2 IMPLANT
SPONGE INTESTINAL PEANUT (DISPOSABLE) ×2 IMPLANT
SPONGE LAP 18X18 RF (DISPOSABLE) ×2 IMPLANT
SPONGE LAP 4X18 RFD (DISPOSABLE) ×2 IMPLANT
SUT BONE WAX W31G (SUTURE) ×4 IMPLANT
SUT ETHIBON 2 0 V 52N 30 (SUTURE) ×8 IMPLANT
SUT ETHIBOND 2 0 SH (SUTURE) ×4
SUT ETHIBOND 2 0 SH 36X2 (SUTURE) IMPLANT
SUT PROLENE 3 0 SH 48 (SUTURE) ×4 IMPLANT
SUT PROLENE 3 0 SH DA (SUTURE) ×2 IMPLANT
SUT PROLENE 3 0 SH1 36 (SUTURE) ×6 IMPLANT
SUT PROLENE 4 0 RB 1 (SUTURE) ×32
SUT PROLENE 4 0 SH DA (SUTURE) ×6 IMPLANT
SUT PROLENE 4-0 RB1 .5 CRCL 36 (SUTURE) ×6 IMPLANT
SUT STEEL 6MS V (SUTURE) IMPLANT
SUT STEEL SZ 6 DBL 3X14 BALL (SUTURE) ×8 IMPLANT
SUT VIC AB 1 CTX 36 (SUTURE) ×16
SUT VIC AB 1 CTX36XBRD ANBCTR (SUTURE) ×4 IMPLANT
SYSTEM SAHARA CHEST DRAIN ATS (WOUND CARE) ×4 IMPLANT
TAPE CLOTH SURG 4X10 WHT LF (GAUZE/BANDAGES/DRESSINGS) ×2 IMPLANT
TAPE PAPER 2X10 WHT MICROPORE (GAUZE/BANDAGES/DRESSINGS) ×2 IMPLANT
TOWEL GREEN STERILE (TOWEL DISPOSABLE) ×4 IMPLANT
TOWEL GREEN STERILE FF (TOWEL DISPOSABLE) ×4 IMPLANT
TRAY CATH LUMEN 1 20CM STRL (SET/KITS/TRAYS/PACK) ×2 IMPLANT
TRAY FOLEY SLVR 16FR TEMP STAT (SET/KITS/TRAYS/PACK) ×4 IMPLANT
TUBE CONNECTING 20'X1/4 (TUBING) ×1
TUBE CONNECTING 20X1/4 (TUBING) ×1 IMPLANT
TUBING ART PRESS 48 MALE/FEM (TUBING) ×4 IMPLANT
UNDERPAD 30X30 (UNDERPADS AND DIAPERS) ×4 IMPLANT
VALVE AORTIC SZ23 INSP/RESIL (Prosthesis & Implant Heart) ×2 IMPLANT
VENT LEFT HEART 12002 (CATHETERS) ×8
WATER STERILE IRR 1000ML POUR (IV SOLUTION) ×8 IMPLANT
YANKAUER SUCT BULB TIP NO VENT (SUCTIONS) ×2 IMPLANT

## 2018-12-08 NOTE — Brief Op Note (Signed)
12/08/2018  8:43 AM  PATIENT:  Cody Hebert  58 y.o. male  PRE-OPERATIVE DIAGNOSIS:  SEVERE AORTIC STENOSIS  POST-OPERATIVE DIAGNOSIS:  SEVERE AORTIC STENOSIS  PROCEDURE:  Procedure(s): AORTIC VALVE REPLACEMENT (AVR) (N/A) TRANSESOPHAGEAL ECHOCARDIOGRAM (TEE) (N/A) Replacement Ascending Aorta SUPRACORONARY (N/A)  SURGEON:  Surgeon(s) and Role:    * Bartle, Fernande Boyden, MD - Primary  PHYSICIAN ASSISTANT:   Nicholes Rough, PA-C   ANESTHESIA:   general  EBL:  1400 mL   BLOOD ADMINISTERED:none  DRAINS: ROUTINE   LOCAL MEDICATIONS USED:  NONE  SPECIMEN:  Source of Specimen:  AORTIC VALVE LEAFLETS  DISPOSITION OF SPECIMEN:  PATHOLOGY  COUNTS:  YES  DICTATION: .Dragon Dictation  PLAN OF CARE: Admit to inpatient   PATIENT DISPOSITION:  ICU - intubated and hemodynamically stable.   Delay start of Pharmacological VTE agent (>24hrs) due to surgical blood loss or risk of bleeding: yes

## 2018-12-08 NOTE — Anesthesia Procedure Notes (Signed)
Arterial Line Insertion Start/End10/22/2020 6:42 AM, 12/08/2018 6:45 AM Performed by: Orlie Dakin, CRNA, CRNA  Patient location: Pre-op. Preanesthetic checklist: patient identified, IV checked, risks and benefits discussed, surgical consent, monitors and equipment checked and pre-op evaluation Patient sedated Left, radial was placed Catheter size: 20 G Hand hygiene performed  and maximum sterile barriers used   Attempts: 1 Procedure performed without using ultrasound guided technique. Following insertion, dressing applied and Biopatch. Post procedure assessment: normal  Patient tolerated the procedure well with no immediate complications. Additional procedure comments: Dollar SRNA placed art line under my supervision.Marland Kitchen

## 2018-12-08 NOTE — Plan of Care (Signed)

## 2018-12-08 NOTE — Anesthesia Procedure Notes (Signed)
Central Venous Catheter Insertion Performed by: Lillia Abed, MD, anesthesiologist Start/End10/22/2020 6:45 AM, 12/08/2018 7:00 AM Patient location: Pre-op. Preanesthetic checklist: patient identified, IV checked, site marked, risks and benefits discussed, surgical consent, monitors and equipment checked, pre-op evaluation, timeout performed and anesthesia consent Position: Trendelenburg Lidocaine 1% used for infiltration and patient sedated Hand hygiene performed  and maximum sterile barriers used  Catheter size: 8.5 Fr PA cath was placed.Sheath introducer Procedure performed using ultrasound guided technique. Ultrasound Notes:anatomy identified, needle tip was noted to be adjacent to the nerve/plexus identified, no ultrasound evidence of intravascular and/or intraneural injection and image(s) printed for medical record Attempts: 1 Following insertion, line sutured, dressing applied and Biopatch. Post procedure assessment: blood return through all ports, free fluid flow and no air  Patient tolerated the procedure well with no immediate complications.

## 2018-12-08 NOTE — Transfer of Care (Signed)
Immediate Anesthesia Transfer of Care Note  Patient: Cody Hebert  Procedure(s) Performed: AORTIC VALVE REPLACEMENT (AVR) (N/A Chest) TRANSESOPHAGEAL ECHOCARDIOGRAM (TEE) (N/A ) Replacement Ascending Aorta SUPRACORONARY (N/A Chest)  Patient Location: ICU  Anesthesia Type:General  Level of Consciousness: sedated and Patient remains intubated per anesthesia plan  Airway & Oxygen Therapy: Patient remains intubated per anesthesia plan and Patient placed on Ventilator (see vital sign flow sheet for setting)  Post-op Assessment: Report given to RN and Post -op Vital signs reviewed and stable  Post vital signs: Reviewed and stable  Last Vitals:  Vitals Value Taken Time  BP 118/58 12/08/18 1454  Temp    Pulse 59 12/08/18 1454  Resp 22 12/08/18 1454  SpO2 98 % 12/08/18 1454    Last Pain:  Vitals:   12/08/18 0559  TempSrc: Oral  PainSc:       Patients Stated Pain Goal: 4 (69/67/89 3810)  Complications: No apparent anesthesia complications

## 2018-12-08 NOTE — Interval H&P Note (Signed)
History and Physical Interval Note:  12/08/2018 7:28 AM  Griffin Dakin  has presented today for surgery, with the diagnosis of SEVERE AS.  The various methods of treatment have been discussed with the patient and family. After consideration of risks, benefits and other options for treatment, the patient has consented to  Procedure(s): AORTIC VALVE REPLACEMENT (AVR) (N/A) TRANSESOPHAGEAL ECHOCARDIOGRAM (TEE) (N/A) as a surgical intervention.  The patient's history has been reviewed, patient examined, no change in status, stable for surgery.  I have reviewed the patient's chart and labs.  Questions were answered to the patient's satisfaction.     Gaye Pollack

## 2018-12-08 NOTE — Anesthesia Procedure Notes (Addendum)
Procedure Name: Intubation Date/Time: 12/08/2018 7:50 AM Performed by: Orlie Dakin, CRNA Pre-anesthesia Checklist: Patient identified, Emergency Drugs available, Suction available and Patient being monitored Patient Re-evaluated:Patient Re-evaluated prior to induction Oxygen Delivery Method: Circle system utilized Preoxygenation: Pre-oxygenation with 100% oxygen Induction Type: IV induction Ventilation: Mask ventilation without difficulty and Oral airway inserted - appropriate to patient size Laryngoscope Size: Sabra Heck and 3 Grade View: Grade I Tube type: Oral Tube size: 8.0 mm Number of attempts: 1 Airway Equipment and Method: Stylet and Oral airway Placement Confirmation: ETT inserted through vocal cords under direct vision,  positive ETCO2 and breath sounds checked- equal and bilateral Secured at: 23 cm Tube secured with: Tape Dental Injury: Teeth and Oropharynx as per pre-operative assessment  Comments: DL per S Dollar SRNA with my supervision.

## 2018-12-08 NOTE — Progress Notes (Signed)
Patient ID: Cody Hebert, male   DOB: [birth year], 58 y.o.   MRN: [id number]  TCTS Evening Rounds:   Hemodynamically stable, AV paced 80 CI = 2.7  Extubated and neuro intact  Urine output good  CT output low  CBC    Component Value Date/Time   WBC 14.2 (H) 12/08/2018 1432   RBC 3.63 (L) 12/08/2018 1432   HGB 11.6 (L) 12/08/2018 1432   HGB 15.9 11/14/2018 1515   HCT 34.2 (L) 12/08/2018 1432   HCT 47.4 11/14/2018 1515   PLT 127 (L) 12/08/2018 1432   PLT 226 11/14/2018 1515   MCV 94.2 12/08/2018 1432   MCV 91 11/14/2018 1515   MCH 32.0 12/08/2018 1432   MCHC 33.9 12/08/2018 1432   RDW 13.3 12/08/2018 1432   RDW 12.5 11/14/2018 1515     BMET    Component Value Date/Time   NA 133 (L) 12/05/2018 1515   NA 136 11/14/2018 1515   K 4.5 12/05/2018 1515   CL 103 12/05/2018 1515   CO2 21 (L) 12/05/2018 1515   GLUCOSE 94 12/05/2018 1515   BUN 12 12/05/2018 1515   BUN 12 11/14/2018 1515   CREATININE 0.93 12/05/2018 1515   CALCIUM 9.2 12/05/2018 1515   GFRNONAA >60 12/05/2018 1515   GFRAA >60 12/05/2018 1515     A/P:  Stable postop course. Continue current plans

## 2018-12-08 NOTE — Procedures (Signed)
Extubation Procedure Note  Patient Details:   Name: Cody Hebert DOB: 06-26-1960 MRN: [id number]   Airway Documentation:  Airway 8 mm (Active)  Secured at (cm) 24 cm 12/08/18 1454  Measured From Lips 12/08/18 1454  Secured Location Right 12/08/18 1454  Secured By Pink Tape 12/08/18 1454  Site Condition Dry 12/08/18 1454   Vent end date: (not recorded) Vent end time: (not recorded)   Evaluation  O2 sats: stable throughout Complications: No apparent complications Patient did tolerate procedure well. Bilateral Breath Sounds: Clear   Yes   Patient extubated per rapid wean protocol. Patient had a NIF of -22 and a VC of 1.1L with good effort on the first attempt. Cuff leak was heard. No stridor was noted. RN and family member at bedside with RT during extubation. Patient was placed on a 4L [place].  Tiburcio Bash 12/08/2018, 5:43 PM

## 2018-12-08 NOTE — Progress Notes (Signed)
  Echocardiogram Echocardiogram Transesophageal has been performed.  Cody Hebert 12/08/2018, 9:06 AM

## 2018-12-08 NOTE — Op Note (Signed)
CARDIOVASCULAR SURGERY OPERATIVE NOTE  12/08/2018  Surgeon:  Cody BorneBryan K. Jayne Peckenpaugh, MD  First Assistant: Cody Favreessa Conte,  PA-C   Preoperative Diagnosis:  Severe bicuspid aortic valve stenosis with 4.3 cm ascending aortic aneurysm.   Postoperative Diagnosis:  Same   Procedure:  1. Median Sternotomy 2. Extracorporeal circulation 3.   Replacement of the supra-coronary ascending aorta (hemi-arch) using a 28 mm Hemashield graft under deep hypothermic circulatory arrest 4.   Aortic valve replacement using a 23 mm Edwards INSPIRIS RESILIA pericardial valve.  Anesthesia:  General Endotracheal   Clinical History/Surgical Indication:  This 58 year old gentleman has stage D, critical, symptomatic bicuspid aortic valve stenosis with NYHA class lll symptoms of exertional fatigue and shortness of breath as well as exertional chest discomfort consistent with chronic diastolic congestive heart failure. I have personally reviewed his 2D echo and cardiac cath. His echo shows a severely calcified bicuspid aortic valve with a mean gradient of 72 mm Hg and an AVA of 0.5 cm2 consistent with critical AS. Cath shows mild insignificant LAD stenosis, mild pulmonary HTN and a mean PCW pressure of 27 mm Hg. I agree that AVR is indicated in this patient. I don't think he is a candidate for TAVR due to his young age. I think open surgical AVR is the best treatment for him. I discussed the alternatives of mechanical and bioprosthetic valves including the pros and cons of both. He would like to use a bioprosthetic valve so that he does not need to be on Coumadin. Given his age of 58 I think it is a reasonable alternative since the current generation of Edwards pericardial valve has very good longevity and would allow TAVR in the future if he did have significant structural deterioration. 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 I don't think that this degree  of dilatation requires replacement of the ascending aorta.  I discussed the operative procedure with the patient andhis wifeincluding alternatives, benefits and risks; including but not limited to bleeding, blood transfusion, infection, stroke, myocardial infarction, graft failure, heart block requiring a permanent pacemaker, organ dysfunction, and death. Cody HiddenGary Maynardunderstands and agrees to proceed.    Preparation:  The patient was seen in the preoperative holding area and the correct patient, correct operation were confirmed with the patient after reviewing the medical record and catheterization. The consent was signed by me. Preoperative antibiotics were given. A pulmonary arterial line and radial arterial line were placed by the anesthesia team. The patient was taken back to the operating room and positioned supine on the operating room table. After being placed under general endotracheal anesthesia by the anesthesia team a foley catheter was placed. The neck, chest, abdomen, and both legs were prepped with betadine soap and solution and draped in the usual sterile manner. A surgical time-out was taken and the correct patient and operative procedure were confirmed with the nursing and anesthesia staff.  TEE:  Performed by Dr. Renold Hebert . This showed critical aortic stenosis with a mean gradient in the 70's. Normal LV systolic function. Moderate concentric LVH. Mild MR.   Cardiopulmonary Bypass:  A median sternotomy was performed. The pericardium was opened in the midline. Right ventricular function appeared normal. The ascending aorta was of dilated and there were some adhesions between the aorta and the pericardium laterally.  The aorta was normal size distally at the origin of the innominate artery. There were no contraindications to aortic cannulation  or cross-clamping. The patient was fully systemically heparinized and the ACT was maintained > 400 sec. The distal ascending aorta was  cannulated with a 43F aortic cannula for arterial inflow. Venous cannulation was performed via the right atrial appendage using a two-staged venous cannula. An antegrade cardioplegia cannula was placed in the ascending aorta. A retrograde cardioplegia cannula was placed through the right atrium into the coronary sinus without difficulty.The patient was placed on cardiopulmonary bypass and a left ventricular vent was placed via the right superior pulmonary vein. Aortic occlusion was performed with a single cross-clamp. As soon as the aorta was clamped a large hematoma developed around the antegrade cardioplegia cannula and there was bleeding from the aortic adventitia between the cross clamp and the cardioplegia cannula. I was concerned that there may be an aortic dissection so the cross clamp was removed. TEE was used to evaluate the aorta and the aortic arch appeared intact with no dissection seen. It was not possible to visualize the ascending aorta very well.  Since his ascending aorta was 4.3 cm and he had a bicuspid aortic valve I thought the best option would be to proceed with replacement of the ascending aorta to avoid any aortic dissection.  A temperature probe was inserted into the interventricular septum and an insulating pad was placed in the pericardium. CO2 was insufflated into the pericardium throughout the case to minimize intracardiac air.    Resection and grafting of ascending aortic aneurysm:   Systemic cooling was begun with a goal temperature of 18 degrees centigrade by bladder and rectal temperature probes.   After 30 minutes of cooling the target temperature of 18 degrees centigrade was reached. Cerebral oximetry was 70% bilaterally. BIS was zero. The patient was given Propofol and 125 mg of Solumedrol. The head was packed in ice. The bed was placed in steep trendelenburg. Circulatory arrest was begun and the blood volume emptied into the venous reservoir.  Cold blood retrograde  cardioplegia was given and myocardial temperature dropped to 10 degrees centigrade. Additional doses were given at approximately 20 minute intervals throughout the period of circulatory arrest and cross-clamping. Complete diastolic arrest was maintained. The aortic cannula was removed. The aorta was transected just proximal to the innominate artery beveling the resection out along the undersurface of the aortic arch (Hemiarch replacement). There was no aortic dissection. There was a small tear at the insertion site of the antegrade cardioplegia cannula that was responsible for the aortic hematoma. The aortic diameter was measured at 28 mm here. A 28 x 10 mm Hemasheild Platinum vascular graft was prepared. ( Catalog # [id number] P0, Lot [id number], SN [id number]). It was anastomosed to the aortic arch in an end to end manner using 3-0 prolene continuous suture with a felt strip to reinforce the anastomisis. A light coating of CoSeal was applied to seal needle holes. The arterial end of the bypass circuit was then connected to the 58mm side arm graft and circulation was slowly resumed. The aortic graft was cross-clamped proximal to the side arm graft and full CPB support was resumed. Circulatory arrest time was 21 minutes.   Aortic Valve Replacement:   The aorta was transected just distal to the STJ.  The native valve was type 1 bicuspid with fusion of the left and right cusps and severely calcified leaflets and severe annular calcification. The ostia of the coronary arteries were in normal position and were not obstructed.  The aortic sinuses were not dilated. The native valve leaflets were excised and  the annulus was decalcified with rongeurs. Care was taken to remove all particulate debris. The left ventricle was directly inspected for debris and then irrigated with ice saline solution. The annulus was sized and a size 23 mm Edwards INSPIRIS RESILIA pericardial valve was chosen. The model number was [id number] and the  serial number was [id number].  2-0 Ethibond pledgeted horizontal mattress sutures were placed around the annulus with the pledgets in a sub-annular position. The sutures were placed through the sewing ring and the valve lowered into place. The sutures were tied sequentially. The valve seated nicely and the coronary ostia were not obstructed. The prosthetic valve leaflets moved normally and there was no sub-valvular obstruction.   The aortic graft was then cut to the appropriate length and anastomosed end to end to the supra-coronary aorta using continuous 3-0 prolene suture with a felt strip for reinforcement. CoSeal was applied to seal the needle holes in the grafts. A vent cannula was placed into the graft to remove any air. Deairing maneuvers were performed and the bed placed in trendelenburg position.   Completion:   The patient was rewarmed to 37 degrees Centigrade. The crossclamp was removed with a time of 121 minutes. There was spontaneous return of sinus rhythm. The position of the grafts was satisfactory. The vascular anastomoses all appeared hemostatic. Two temporary epicardial pacing wires were placed on the right atrium and two on the right ventricle. The patient was weaned from CPB without difficulty on no inotropic agents. CPB time was 210 minutes. Cardiac output was 6 LPM. TEE showed a normal functioning aortic valve prosthesis with no AI. There was unchanged mild MR. LV function appeared normal. Heparin was fully reversed with protamine and the venous cannulas removed. The aortic side arm graft was ligated with #1 silk suture leaving a short stump. It was then suture ligated with 3-0 prolene pledgetted horizontal mattress suture. Hemostasis was achieved.  Fibrinogen was low and the patient was given 2 units of cryoprecipitate.  Mediastinal drainage tubes were placed. The sternum was closed with double #6 stainless steel wires. The fascia was closed with continuous # 1 vicryl suture. The  subcutaneous tissue was closed with 2-0 vicryl continuous suture. The skin was closed with 3-0 vicryl subcuticular suture. All sponge, needle, and instrument counts were reported correct at the end of the case. Dry sterile dressings were placed over the incisions and around the chest tubes which were connected to pleurevac suction. The patient was then transported to the surgical intensive care unit in stable condition.

## 2018-12-09 ENCOUNTER — Encounter (HOSPITAL_COMMUNITY): Payer: Self-pay | Admitting: Surgery

## 2018-12-09 ENCOUNTER — Inpatient Hospital Stay (HOSPITAL_COMMUNITY): Payer: 59

## 2018-12-09 DIAGNOSIS — Z95828 Presence of other vascular implants and grafts: Secondary | ICD-10-CM

## 2018-12-09 HISTORY — DX: Presence of other vascular implants and grafts: Z95.828

## 2018-12-09 LAB — PREPARE CRYOPRECIPITATE
Unit division: 0
Unit division: 0

## 2018-12-09 LAB — CBC
HCT: 33.4 % — ABNORMAL LOW (ref 39.0–52.0)
HCT: 34.2 % — ABNORMAL LOW (ref 39.0–52.0)
Hemoglobin: 11.5 g/dL — ABNORMAL LOW (ref 13.0–17.0)
Hemoglobin: 11.8 g/dL — ABNORMAL LOW (ref 13.0–17.0)
MCH: 32.3 pg (ref 26.0–34.0)
MCH: 32.1 pg (ref 26.0–34.0)
MCHC: 34.4 g/dL (ref 30.0–36.0)
MCHC: 34.5 g/dL (ref 30.0–36.0)
MCV: 93.8 fL (ref 80.0–100.0)
MCV: 92.9 fL (ref 80.0–100.0)
Platelets: 146 10*3/uL — ABNORMAL LOW (ref 150–400)
Platelets: 150 10*3/uL (ref 150–400)
RBC: 3.56 MIL/uL — ABNORMAL LOW (ref 4.22–5.81)
RBC: 3.68 MIL/uL — ABNORMAL LOW (ref 4.22–5.81)
RDW: 13.5 % (ref 11.5–15.5)
RDW: 13.5 % (ref 11.5–15.5)
WBC: 14.6 10*3/uL — ABNORMAL HIGH (ref 4.0–10.5)
WBC: 19.3 10*3/uL — ABNORMAL HIGH (ref 4.0–10.5)
nRBC: 0 % (ref 0.0–0.2)
nRBC: 0 % (ref 0.0–0.2)

## 2018-12-09 LAB — GLUCOSE, CAPILLARY
Glucose-Capillary: 104 mg/dL — ABNORMAL HIGH (ref 70–99)
Glucose-Capillary: 105 mg/dL — ABNORMAL HIGH (ref 70–99)
Glucose-Capillary: 109 mg/dL — ABNORMAL HIGH (ref 70–99)
Glucose-Capillary: 109 mg/dL — ABNORMAL HIGH (ref 70–99)
Glucose-Capillary: 123 mg/dL — ABNORMAL HIGH (ref 70–99)
Glucose-Capillary: 141 mg/dL — ABNORMAL HIGH (ref 70–99)
Glucose-Capillary: 149 mg/dL — ABNORMAL HIGH (ref 70–99)
Glucose-Capillary: 160 mg/dL — ABNORMAL HIGH (ref 70–99)
Glucose-Capillary: 70 mg/dL (ref 70–99)
Glucose-Capillary: 93 mg/dL (ref 70–99)
Glucose-Capillary: 94 mg/dL (ref 70–99)

## 2018-12-09 LAB — BASIC METABOLIC PANEL
Anion gap: 8 (ref 5–15)
Anion gap: 10 (ref 5–15)
BUN: 14 mg/dL (ref 6–20)
BUN: 17 mg/dL (ref 6–20)
CO2: 22 mmol/L (ref 22–32)
CO2: 23 mmol/L (ref 22–32)
Calcium: 7.9 mg/dL — ABNORMAL LOW (ref 8.9–10.3)
Calcium: 8.3 mg/dL — ABNORMAL LOW (ref 8.9–10.3)
Chloride: 107 mmol/L (ref 98–111)
Chloride: 99 mmol/L (ref 98–111)
Creatinine, Ser: 0.85 mg/dL (ref 0.61–1.24)
Creatinine, Ser: 0.99 mg/dL (ref 0.61–1.24)
GFR calc Af Amer: >60 mL/min (ref >60)
GFR calc Af Amer: >60 mL/min (ref >60)
GFR calc non Af Amer: >60 mL/min (ref >60)
GFR calc non Af Amer: >60 mL/min (ref >60)
Glucose, Bld: 101 mg/dL — ABNORMAL HIGH (ref 70–99)
Glucose, Bld: 146 mg/dL — ABNORMAL HIGH (ref 70–99)
Potassium: 4.2 mmol/L (ref 3.5–5.1)
Potassium: 4.1 mmol/L (ref 3.5–5.1)
Sodium: 137 mmol/L (ref 135–145)
Sodium: 132 mmol/L — ABNORMAL LOW (ref 135–145)

## 2018-12-09 LAB — POCT I-STAT 7, (LYTES, BLD GAS, ICA,H+H)
Acid-base deficit: 1 mmol/L (ref 0.0–2.0)
Bicarbonate: 26.3 mmol/L (ref 20.0–28.0)
Calcium, Ion: 1.2 mmol/L (ref 1.15–1.40)
HCT: 34 % — ABNORMAL LOW (ref 39.0–52.0)
Hemoglobin: 11.6 g/dL — ABNORMAL LOW (ref 13.0–17.0)
O2 Saturation: 92 %
Patient temperature: 36.2
Potassium: 4.3 mmol/L (ref 3.5–5.1)
Sodium: 146 mmol/L — ABNORMAL HIGH (ref 135–145)
TCO2: 28 mmol/L (ref 22–32)
pCO2 arterial: 51.8 mmHg — ABNORMAL HIGH (ref 32.0–48.0)
pH, Arterial: 7.31 — ABNORMAL LOW (ref 7.350–7.450)
pO2, Arterial: 69 mmHg — ABNORMAL LOW (ref 83.0–108.0)

## 2018-12-09 LAB — BPAM CRYOPRECIPITATE
Blood Product Expiration Date: 202010221830
Blood Product Expiration Date: 202010221830
ISSUE DATE / TIME: 202010221301
ISSUE DATE / TIME: 202010221301
Unit Type and Rh: 6200
Unit Type and Rh: 6200

## 2018-12-09 LAB — MAGNESIUM
Magnesium: 2.5 mg/dL — ABNORMAL HIGH (ref 1.7–2.4)
Magnesium: 2.3 mg/dL (ref 1.7–2.4)

## 2018-12-09 MED ORDER — FUROSEMIDE 10 MG/ML IJ SOLN
40.0000 mg | Freq: Once | INTRAMUSCULAR | Status: AC
  Administered 2018-12-10: 40 mg via INTRAVENOUS
  Filled 2018-12-09: qty 4

## 2018-12-09 MED ORDER — SODIUM CHLORIDE 0.9 % IV SOLN
1.5000 g | Freq: Two times a day (BID) | INTRAVENOUS | Status: AC
  Administered 2018-12-10 (×2): 1.5 g via INTRAVENOUS
  Filled 2018-12-09 (×2): qty 1.5

## 2018-12-09 MED ORDER — PNEUMOCOCCAL VAC POLYVALENT 25 MCG/0.5ML IJ INJ
0.5000 mL | INJECTION | INTRAMUSCULAR | Status: AC
  Administered 2018-12-13: 12:00:00 0.5 mL via INTRAMUSCULAR
  Filled 2018-12-09 (×2): qty 0.5

## 2018-12-09 MED ORDER — ENOXAPARIN SODIUM 40 MG/0.4ML ~~LOC~~ SOLN
40.0000 mg | Freq: Every day | SUBCUTANEOUS | Status: DC
  Administered 2018-12-09 – 2018-12-12 (×4): 40 mg via SUBCUTANEOUS
  Filled 2018-12-09 (×4): qty 0.4

## 2018-12-09 MED ORDER — FUROSEMIDE 10 MG/ML IJ SOLN
40.0000 mg | Freq: Once | INTRAMUSCULAR | Status: AC
  Administered 2018-12-09: 40 mg via INTRAVENOUS
  Filled 2018-12-09: qty 4

## 2018-12-09 MED ORDER — INSULIN ASPART 100 UNIT/ML ~~LOC~~ SOLN: 0.0000 [IU] | SUBCUTANEOUS | Status: DC

## 2018-12-09 MED FILL — Electrolyte-R (PH 7.4) Solution: INTRAVENOUS | Qty: 4000 | Status: AC

## 2018-12-09 MED FILL — Heparin Sod (Porcine)-NaCl IV Soln 1000 Unit/500ML-0.9%: INTRAVENOUS | Qty: 500 | Status: AC

## 2018-12-09 MED FILL — Lidocaine HCl Local Soln Prefilled Syringe 100 MG/5ML (2%): INTRAMUSCULAR | Qty: 10 | Status: AC

## 2018-12-09 MED FILL — Sodium Chloride IV Soln 0.9%: INTRAVENOUS | Qty: 2000 | Status: AC

## 2018-12-09 MED FILL — Heparin Sodium (Porcine) Inj 1000 Unit/ML: INTRAMUSCULAR | Qty: 20 | Status: AC

## 2018-12-09 MED FILL — Thrombin (Recombinant) For Soln 20000 Unit: CUTANEOUS | Qty: 1 | Status: AC

## 2018-12-09 MED FILL — Magnesium Sulfate Inj 50%: INTRAMUSCULAR | Qty: 10 | Status: AC

## 2018-12-09 MED FILL — Potassium Chloride Inj 2 mEq/ML: INTRAVENOUS | Qty: 40 | Status: AC

## 2018-12-09 MED FILL — Heparin Sodium (Porcine) Inj 1000 Unit/ML: INTRAMUSCULAR | Qty: 30 | Status: AC

## 2018-12-09 MED FILL — Sodium Bicarbonate IV Soln 8.4%: INTRAVENOUS | Qty: 100 | Status: AC

## 2018-12-09 NOTE — Discharge Summary (Signed)
Physician Discharge Summary  Patient ID: Cody Hebert MRN: [id number] DOB/AGE: [birth year] 58 y.o.  Admit date: 12/08/2018 Discharge date: 12/13/2018  Admission Diagnoses:  Patient Active Problem List   Diagnosis Date Noted  . Severe aortic valve stenosis   . Bicuspid aortic valve 11/07/2018  . Mixed hyperlipidemia 11/07/2018  . Chest pain of uncertain etiology 11/07/2018   Discharge Diagnoses:   Patient Active Problem List   Diagnosis Date Noted  . S/P ascending aortic replacement 12/09/2018  . S/P AVR (aortic valve replacement) and aortoplasty 12/08/2018  . Severe aortic valve stenosis   . Bicuspid aortic valve 11/07/2018  . Mixed hyperlipidemia 11/07/2018  . Chest pain of uncertain etiology 11/07/2018   Discharged Condition: good  History of Present Illness:   [name] is a 58 yo white male with history of hyperlipidemia and nicotine abuse.  He was diagnosed with aortic stenosis with a bicuspid valve several years ago.  He was found to have a murmur and he underwent Echocardiogram by Dr. Dulce SellarMunley at that time.  The patient has not follow up since initial diagnosis.  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 He was also experiencing exertional shortness of breath and fatigue over the past year.  He denied any dizziness or syncope.  He had an echocardiogram performed on 11/10/2018 which showed critical AS with a bicuspid valve.  His aorta was ectatic with normal measurements.  It was felt he would require aortic valve replacement and he was referred to [facility] for evaluation.  He was evaluated by Dr. Laneta SimmersBartle who was in agreement the patient would benefit from aortic valve replacement.  The patient wished to proceed with use of a tissue valve.  The risks and benefits of the procedure were explained to the patient and he was agreeable to proceed.  Hospital Course:    [name].  He was taken to the operating room and underwent AVR and suparcoronary replacement of his ascending aorta due to acute dissection in the operating room.  He tolerated the procedure without difficulty and was taken to the SICU in stable condition.  He was extubated the evening of surgery.  During his stay in the SICU the patient did not require and inotropic or pressor support.  His chest tubes and arterial lines were removed without difficulty.  He originally had type 1 second degree heart block.  He was weaned off his temporary pacer as this resolved. He developed atrial fibrillation that was treated with IV amiodarone resulting in conversion back to NSR with episodic type 1 second degree heart block. The amiodarone was converted to the oral form.  He otherwise made a progressive and uneventful recovery.  He was maintaining adequate O2 saturations on RA, was independently ambulatory, and tolerating a cardiac diet at the time of discharge. His incision were healing with no sign of complication.  Significant Diagnostic Studies: cardiac graphics:   Echocardiogram:   IMPRESSIONS    1. Left ventricular ejection fraction, by visual estimation, is 60 to 65%. The left ventricle has normal function. Normal left ventricular size. There is mildly increased left ventricular hypertrophy.  2. Left ventricular diastolic Doppler parameters are consistent with impaired relaxation pattern of LV diastolic filling.  3. Global right ventricle has normal systolic function.The right ventricular size is normal. No increase in right ventricular wall thickness.  4.  Left atrial size was normal.  5. Right atrial size was normal.  6. Mild to moderate mitral annular calcification.  7. The mitral valve is normal in structure. No evidence of mitral valve regurgitation. No evidence of mitral stenosis.  8. The tricuspid valve is normal in structure. Tricuspid valve  regurgitation was not visualized by color flow Doppler.  9. The aortic valve is bicuspid Aortic valve regurgitation is mild by color flow Doppler. Severe aortic valve stenosis. 10. The pulmonic valve was normal in structure. Pulmonic valve regurgitation is not visualized by color flow Doppler. 11. There is mild to moderate dilatation of the ascending aorta measuring 44 mm. 12. Moderately elevated pulmonary artery systolic pressure. 13. The inferior vena cava is normal in size with greater than 50% respiratory variability, suggesting right atrial pressure of 3 mmHg.  Treatments: surgery:   1. Median Sternotomy 2. Extracorporeal circulation  3.  Replacement of the supra-coronary ascending aorta (hemi-arch) using               a 28 mm Hemashield graft under deep hypothermic circulatory arrest  4.   Aortic valve replacement using a 23 mm Edwards INSPIRIS RESILIA pericardial valve.  Discharge Exam: Blood pressure 112/72, pulse 64, temperature 98.6 F (37 C), temperature source Oral, resp. rate 18, height 5\' 9"  (1.753 m), weight 84.9 kg, SpO2 98 %.  Physical Exam: General appearance:alert, cooperative and no distress Neurologic:intact Heart:RRR, expected flow murmur of bioprosthetic aortic valve. Lungs:clear to auscultation bilaterally Abdomen:Soft and non-tender. Extremities:All well perfused, no peripheral edema. Wound:The sternotomy incision is intact and dry.IJ cath and pacer wire sites covered with  dry dressings.   Discharge Medications:  The patient has been discharged on:   1.Beta Blocker:  Yes [   ]                              No   [ x  ]                              If No, reason:  2nd degree heart block  2.Ace Inhibitor/ARB: Yes [   ]                                     No  [  x  ]                                     If No, reason:  Normotensive, will be added as outpatient  3.Statin:   Yes [ x ]                  No  [   ]                  If No,  reason:  4.Ecasa:  Yes  [ x  ]                  No   [   ]                  If No, reason:    Discharge Instructions    Amb Referral to Cardiac Rehabilitation   Complete by: As directed    Will  send Cardiac Rehab Phase 2 referral to [facility]   Diagnosis: Valve Replacement   Valve: Aortic   After initial evaluation and assessments completed: Virtual Based Care may be provided alone or in conjunction with Phase 2 Cardiac Rehab based on patient barriers.: Yes     Allergies as of 12/13/2018      Reactions   Doxycycline Nausea And Vomiting      Medication List    STOP taking these medications   Goodys Extra Strength 520-260-32.5 MG Pack Generic drug: Aspirin-Acetaminophen-Caffeine     TAKE these medications   amiodarone 200 MG tablet Commonly known as: PACERONE Take 1 tablet (200 mg total) by mouth 2 (two) times daily.   aspirin 325 MG EC tablet Take 1 tablet (325 mg total) by mouth daily. What changed:   medication strength  how much to take   atorvastatin 20 MG tablet Commonly known as: LIPITOR Take 1 tablet (20 mg total) by mouth daily. What changed: when to take this   CoQ10 100 MG Caps Take 100 mg by mouth at bedtime.   loratadine 10 MG tablet Commonly known as: CLARITIN Take 10 mg by mouth daily.   naproxen sodium 220 MG tablet Commonly known as: ALEVE Take 440 mg by mouth 2 (two) times daily as needed (pain.).   nitroGLYCERIN 0.4 MG SL tablet Commonly known as: NITROSTAT Place 0.4 mg under the tongue every 5 (five) minutes x 3 doses as needed for chest pain.   omeprazole 20 MG capsule Commonly known as: PRILOSEC Take 20 mg by mouth daily.   traMADol 50 MG tablet Commonly known as: ULTRAM Take 1 tablet (50 mg total) by mouth every 6 (six) hours as needed for up to 5 days for moderate pain.      Follow-up Information    Alleen Borne, MD Follow up on 01/11/2019.   Specialty: Cardiothoracic Surgery Why: Appointment is at 11:00, please get  CXR at 10:30 at [facility] located on first floor of office building Contact information: [address] [place] [address] [phone]        Thomasene Ripple, DO Follow up.   Specialty: Cardiology Why: Cardiology hospital follow up on 12/26/18 at 3:15. Please arrive 15 minutes early for check in.  Contact information: [address] [place] [address] [phone]        [facility] Follow up on 12/23/2018.   Specialty: Cardiothoracic Surgery Why: Appointment is at 11:00 for suture removal. Contact information: [address] [place], [address] [place] [address] [phone]          Signed: Leary Roca, PA-C 12/13/2018, 10:40 AM

## 2018-12-09 NOTE — Progress Notes (Signed)
1 Day Post-Op Procedure(s) (LRB): AORTIC VALVE REPLACEMENT (AVR) (N/A) TRANSESOPHAGEAL ECHOCARDIOGRAM (TEE) (N/A) Replacement Ascending Aorta SUPRACORONARY (N/A) Subjective: No complaints. Minimal pain  Objective: Vital signs in last 24 hours: Temp:  [96.6 F (35.9 C)-100 F (37.8 C)] 99.1 F (37.3 C) (10/23 0700) Pulse Rate:  [59-88] 78 (10/23 0700) Cardiac Rhythm: A-V Sequential paced (10/23 0400) Resp:  [14-27] 20 (10/23 0700) BP: (88-120)/(54-88) 99/69 (10/23 0600) SpO2:  [94 %-99 %] 98 % (10/23 0700) Arterial Line BP: (90-129)/(46-75) 109/59 (10/23 0700) FiO2 (%):  [40 %-50 %] 40 % (10/22 1705) Weight:  [90 kg] 90 kg (10/23 0500)  Hemodynamic parameters for last 24 hours: PAP: (12-37)/(-1-22) 34/20 CO:  [5.2 L/min-8.6 L/min] 8.6 L/min CI:  [2.6 L/min/m2-4.3 L/min/m2] 4.3 L/min/m2  Intake/Output from previous day: 10/22 0701 - 10/23 0700 In: 5607.8 [P.O.:120; I.V.:3645.4; Blood:880; IV Piggyback:962.4] Out: 3785 [Urine:2730; Blood:1400; Chest Tube:610] Intake/Output this shift: No intake/output data recorded.  General appearance: alert and cooperative Neurologic: intact Heart: regular rate and rhythm, S1, S2 normal, no murmur, click, rub or gallop Lungs: clear to auscultation bilaterally Extremities: edema mild Wound: dressing dry  Lab Results: Recent Labs    12/08/18 2130 12/09/18 0342  WBC 12.4* 14.6*  HGB 11.4* 11.5*  HCT 33.2* 33.4*  PLT 131* 146*   BMET:  Recent Labs    12/08/18 2123 12/09/18 0342  NA 142 137  K 4.1 4.2  CL 108 107  CO2  --  22  GLUCOSE 113* 101*  BUN 16 14  CREATININE 0.70 0.85  CALCIUM  --  7.9*    PT/INR:  Recent Labs    12/08/18 1432  LABPROT 17.1*  INR 1.4*   ABG    Component Value Date/Time   PHART 7.357 12/08/2018 1844   HCO3 24.9 12/08/2018 1844   TCO2 22 12/08/2018 2123   ACIDBASEDEF 1.0 12/08/2018 1844   O2SAT 93.0 12/08/2018 1844   CBG (last 3)  Recent Labs    12/09/18 0331 12/09/18 0437  12/09/18 0651  GLUCAP 94 93 109*   CXR: clear  ECG: sinus rhythm. PR 200 ms.  Assessment/Plan: S/P Procedure(s) (LRB): AORTIC VALVE REPLACEMENT (AVR) (N/A) TRANSESOPHAGEAL ECHOCARDIOGRAM (TEE) (N/A) Replacement Ascending Aorta SUPRACORONARY (N/A)  POD 1 Hemodynamically stable. Rhythm under pacer is sinus with type 1 second degree AV block. ECG this am showed sinus with no block. Will continue DDD pacing for now and follow. This should resolve. Hold off on beta blocker for now.  CT output 30-40/hr and thin. Will keep tube in this am and get OOB. May remove them later today if drainage continues to decrease.  Glucose under good control. No DM. DC CBG's  DC swan, arterial line.  IS.  Diurese   LOS: 1 day    Gaye Pollack 12/09/2018

## 2018-12-09 NOTE — Discharge Instructions (Signed)

## 2018-12-09 NOTE — Anesthesia Postprocedure Evaluation (Signed)
Anesthesia Post Note  Patient: Cody Hebert  Procedure(s) Performed: AORTIC VALVE REPLACEMENT (AVR) (N/A Chest) TRANSESOPHAGEAL ECHOCARDIOGRAM (TEE) (N/A ) Replacement Ascending Aorta SUPRACORONARY (N/A Chest)     Patient location during evaluation: SICU Anesthesia Type: General Level of consciousness: sedated and patient remains intubated per anesthesia plan Pain management: pain level controlled Vital Signs Assessment: post-procedure vital signs reviewed and stable Respiratory status: patient remains intubated per anesthesia plan and patient on ventilator - see flowsheet for VS Cardiovascular status: stable Anesthetic complications: no    Last Vitals:  Vitals:   12/09/18 1100 12/09/18 1212  BP: 102/72   Pulse: 82   Resp: (!) 28   Temp:  37.6 C  SpO2: 93%     Last Pain:  Vitals:   12/09/18 1212  TempSrc: Oral  PainSc:                  Nolon Nations

## 2018-12-09 NOTE — Progress Notes (Signed)
TCTS Evening Rounds POD #1 s/p Wheat Up in chair No complaints Exam nonfocal Labs stable this pm  A/p: continue present management Pain control  Cody Hebert Z. Orvan Seen, [place]

## 2018-12-10 ENCOUNTER — Inpatient Hospital Stay (HOSPITAL_COMMUNITY): Payer: 59

## 2018-12-10 LAB — CBC
HCT: 32.5 % — ABNORMAL LOW (ref 39.0–52.0)
Hemoglobin: 11 g/dL — ABNORMAL LOW (ref 13.0–17.0)
MCH: 32 pg (ref 26.0–34.0)
MCHC: 33.8 g/dL (ref 30.0–36.0)
MCV: 94.5 fL (ref 80.0–100.0)
Platelets: 137 10*3/uL — ABNORMAL LOW (ref 150–400)
RBC: 3.44 MIL/uL — ABNORMAL LOW (ref 4.22–5.81)
RDW: 13.2 % (ref 11.5–15.5)
WBC: 15.5 10*3/uL — ABNORMAL HIGH (ref 4.0–10.5)
nRBC: 0 % (ref 0.0–0.2)

## 2018-12-10 LAB — BASIC METABOLIC PANEL
Anion gap: 10 (ref 5–15)
BUN: 14 mg/dL (ref 6–20)
CO2: 24 mmol/L (ref 22–32)
Calcium: 8.4 mg/dL — ABNORMAL LOW (ref 8.9–10.3)
Chloride: 99 mmol/L (ref 98–111)
Creatinine, Ser: 0.81 mg/dL (ref 0.61–1.24)
GFR calc Af Amer: >60 mL/min (ref >60)
GFR calc non Af Amer: >60 mL/min (ref >60)
Glucose, Bld: 137 mg/dL — ABNORMAL HIGH (ref 70–99)
Potassium: 3.8 mmol/L (ref 3.5–5.1)
Sodium: 133 mmol/L — ABNORMAL LOW (ref 135–145)

## 2018-12-10 LAB — GLUCOSE, CAPILLARY
Glucose-Capillary: 120 mg/dL — ABNORMAL HIGH (ref 70–99)
Glucose-Capillary: 142 mg/dL — ABNORMAL HIGH (ref 70–99)
Glucose-Capillary: 156 mg/dL — ABNORMAL HIGH (ref 70–99)
Glucose-Capillary: 156 mg/dL — ABNORMAL HIGH (ref 70–99)
Glucose-Capillary: 166 mg/dL — ABNORMAL HIGH (ref 70–99)
Glucose-Capillary: 170 mg/dL — ABNORMAL HIGH (ref 70–99)

## 2018-12-10 MED ORDER — INFLUENZA VAC SPLIT QUAD 0.5 ML IM SUSY
0.5000 mL | PREFILLED_SYRINGE | INTRAMUSCULAR | Status: AC
  Administered 2018-12-11: 0.5 mL via INTRAMUSCULAR
  Filled 2018-12-10: qty 0.5

## 2018-12-10 MED ORDER — AMIODARONE HCL IN DEXTROSE 360-4.14 MG/200ML-% IV SOLN
60.0000 mg/h | INTRAVENOUS | Status: AC
  Administered 2018-12-10 (×2): 60 mg/h via INTRAVENOUS
  Filled 2018-12-10 (×2): qty 200

## 2018-12-10 MED ORDER — AMIODARONE LOAD VIA INFUSION
150.0000 mg | Freq: Once | INTRAVENOUS | Status: AC
  Administered 2018-12-10: 150 mg via INTRAVENOUS
  Filled 2018-12-10: qty 83.34

## 2018-12-10 MED ORDER — AMIODARONE HCL IN DEXTROSE 360-4.14 MG/200ML-% IV SOLN
30.0000 mg/h | INTRAVENOUS | Status: DC
  Administered 2018-12-10 – 2018-12-11 (×3): 30 mg/h via INTRAVENOUS
  Filled 2018-12-10 (×3): qty 200

## 2018-12-10 NOTE — Progress Notes (Signed)
2 Days Post-Op Procedure(s) (LRB): AORTIC VALVE REPLACEMENT (AVR) (N/A) TRANSESOPHAGEAL ECHOCARDIOGRAM (TEE) (N/A) Replacement Ascending Aorta SUPRACORONARY (N/A) Subjective No complaints  Objective: Vital signs in last 24 hours: Temp:  [98.5 F (36.9 C)-99.7 F (37.6 C)] 98.7 F (37.1 C) (10/24 0730) Pulse Rate:  [76-82] 79 (10/23 1800) Cardiac Rhythm: A-V Sequential paced;Heart block (10/23 1800) Resp:  [26-36] 31 (10/23 1800) BP: (87-102)/(62-78) 87/62 (10/23 1800) SpO2:  [87 %-97 %] 87 % (10/23 1800) Arterial Line BP: (111)/(60) 111/60 (10/23 0900)  Hemodynamic parameters for last 24 hours: PAP: (36)/(19) 36/19  Intake/Output from previous day: 10/23 0701 - 10/24 0700 In: 225 [I.V.:225] Out: 2580 [Urine:2350; Chest Tube:230] Intake/Output this shift: No intake/output data recorded.  General appearance: alert and cooperative Neurologic: intact Heart: friction rub heard tachycardiac Lungs: clear to auscultation bilaterally Extremities: edema 1+ Wound: dressed, dry  Lab Results: Recent Labs    12/09/18 1830 12/10/18 0631  WBC 19.3* 15.5*  HGB 11.8* 11.0*  HCT 34.2* 32.5*  PLT 150 137*   BMET:  Recent Labs    12/09/18 1830 12/10/18 0631  NA 132* 133*  K 4.1 3.8  CL 99 99  CO2 23 24  GLUCOSE 146* 137*  BUN 17 14  CREATININE 0.99 0.81  CALCIUM 8.3* 8.4*    PT/INR:  Recent Labs    12/08/18 1432  LABPROT 17.1*  INR 1.4*   ABG    Component Value Date/Time   PHART 7.357 12/08/2018 1844   HCO3 24.9 12/08/2018 1844   TCO2 22 12/08/2018 2123   ACIDBASEDEF 1.0 12/08/2018 1844   O2SAT 93.0 12/08/2018 1844   CBG (last 3)  Recent Labs    12/10/18 0042 12/10/18 0519 12/10/18 0732  GLUCAP 156* 142* 166*    Assessment/Plan: S/P Procedure(s) (LRB): AORTIC VALVE REPLACEMENT (AVR) (N/A) TRANSESOPHAGEAL ECHOCARDIOGRAM (TEE) (N/A) Replacement Ascending Aorta SUPRACORONARY (N/A) Mobilize Diuresis d/c tubes/lines amiodarone load   LOS: 2 days     Wonda Olds 12/10/2018

## 2018-12-11 ENCOUNTER — Inpatient Hospital Stay (HOSPITAL_COMMUNITY): Payer: 59

## 2018-12-11 LAB — CBC WITH DIFFERENTIAL/PLATELET
Abs Immature Granulocytes: 0.08 10*3/uL — ABNORMAL HIGH (ref 0.00–0.07)
Basophils Absolute: 0 10*3/uL (ref 0.0–0.1)
Basophils Relative: 0 %
Eosinophils Absolute: 0.1 10*3/uL (ref 0.0–0.5)
Eosinophils Relative: 1 %
HCT: 29.4 % — ABNORMAL LOW (ref 39.0–52.0)
Hemoglobin: 10.1 g/dL — ABNORMAL LOW (ref 13.0–17.0)
Immature Granulocytes: 1 %
Lymphocytes Relative: 15 %
Lymphs Abs: 1.9 10*3/uL (ref 0.7–4.0)
MCH: 32.3 pg (ref 26.0–34.0)
MCHC: 34.4 g/dL (ref 30.0–36.0)
MCV: 93.9 fL (ref 80.0–100.0)
Monocytes Absolute: 1.2 10*3/uL — ABNORMAL HIGH (ref 0.1–1.0)
Monocytes Relative: 10 %
Neutro Abs: 9.6 10*3/uL — ABNORMAL HIGH (ref 1.7–7.7)
Neutrophils Relative %: 73 %
Platelets: 140 10*3/uL — ABNORMAL LOW (ref 150–400)
RBC: 3.13 MIL/uL — ABNORMAL LOW (ref 4.22–5.81)
RDW: 13.2 % (ref 11.5–15.5)
WBC: 12.9 10*3/uL — ABNORMAL HIGH (ref 4.0–10.5)
nRBC: 0.8 % — ABNORMAL HIGH (ref 0.0–0.2)

## 2018-12-11 LAB — BASIC METABOLIC PANEL
Anion gap: 9 (ref 5–15)
BUN: 11 mg/dL (ref 6–20)
CO2: 26 mmol/L (ref 22–32)
Calcium: 8.3 mg/dL — ABNORMAL LOW (ref 8.9–10.3)
Chloride: 99 mmol/L (ref 98–111)
Creatinine, Ser: 0.77 mg/dL (ref 0.61–1.24)
GFR calc Af Amer: >60 mL/min (ref >60)
GFR calc non Af Amer: >60 mL/min (ref >60)
Glucose, Bld: 122 mg/dL — ABNORMAL HIGH (ref 70–99)
Potassium: 3.8 mmol/L (ref 3.5–5.1)
Sodium: 134 mmol/L — ABNORMAL LOW (ref 135–145)

## 2018-12-11 MED ORDER — FUROSEMIDE 40 MG PO TABS
40.0000 mg | ORAL_TABLET | Freq: Two times a day (BID) | ORAL | Status: DC
  Administered 2018-12-11 – 2018-12-12 (×3): 40 mg via ORAL
  Filled 2018-12-11 (×3): qty 1

## 2018-12-11 NOTE — Plan of Care (Signed)
  Problem: Education: Goal: Knowledge of General Education information will improve Description Including pain rating scale, medication(s)/side effects and non-pharmacologic comfort measures Outcome: Progressing   Problem: Health Behavior/Discharge Planning: Goal: Ability to manage health-related needs will improve Outcome: Progressing   

## 2018-12-11 NOTE — Plan of Care (Signed)

## 2018-12-11 NOTE — Plan of Care (Signed)
  Problem: Education: Goal: Knowledge of General Education information will improve Description: Including pain rating scale, medication(s)/side effects and non-pharmacologic comfort measures 12/11/2018 2325 by Drenda Freeze, RN Outcome: Progressing 12/11/2018 2324 by Drenda Freeze, RN Outcome: Progressing   Problem: Health Behavior/Discharge Planning: Goal: Ability to manage health-related needs will improve 12/11/2018 2325 by Drenda Freeze, RN Outcome: Progressing 12/11/2018 2324 by Drenda Freeze, RN Outcome: Progressing

## 2018-12-11 NOTE — Progress Notes (Signed)
Resp rate increased due to patient getting back into bed.

## 2018-12-11 NOTE — Progress Notes (Signed)
3 Days Post-Op Procedure(s) (LRB): AORTIC VALVE REPLACEMENT (AVR) (N/A) TRANSESOPHAGEAL ECHOCARDIOGRAM (TEE) (N/A) Replacement Ascending Aorta SUPRACORONARY (N/A) Subjective: Feeling well  Objective: Vital signs in last 24 hours: Temp:  [98.5 F (36.9 C)-100.2 F (37.9 C)] 99.7 F (37.6 C) (10/25 0300) Pulse Rate:  [72-83] 75 (10/24 1800) Cardiac Rhythm: Atrial flutter;Bundle branch block (10/24 1600) Resp:  [20-33] 20 (10/24 1800) BP: (94-115)/(65-79) 115/76 (10/24 1800) SpO2:  [94 %-98 %] 97 % (10/24 1800) FiO2 (%):  [40 %] 40 % (10/24 0800) Weight:  [87.2 kg] 87.2 kg (10/25 0500)  Hemodynamic parameters for last 24 hours:    Intake/Output from previous day: 10/24 0701 - 10/25 0700 In: 1192.6 [P.O.:380; I.V.:712.5; IV Piggyback:100.1] Out: 3425 [Urine:3335; Chest Tube:90] Intake/Output this shift: No intake/output data recorded.  General appearance: alert and cooperative Neurologic: intact Heart: regular rate and rhythm, S1, S2 normal, no murmur, click, rub or gallop Lungs: clear to auscultation bilaterally Extremities: edema minimal Wound: c/d/i  Lab Results: Recent Labs    12/10/18 0631 12/11/18 0506  WBC 15.5* 12.9*  HGB 11.0* 10.1*  HCT 32.5* 29.4*  PLT 137* 140*   BMET:  Recent Labs    12/10/18 0631 12/11/18 0506  NA 133* 134*  K 3.8 3.8  CL 99 99  CO2 24 26  GLUCOSE 137* 122*  BUN 14 11  CREATININE 0.81 0.77  CALCIUM 8.4* 8.3*    PT/INR:  Recent Labs    12/08/18 1432  LABPROT 17.1*  INR 1.4*   ABG    Component Value Date/Time   PHART 7.357 12/08/2018 1844   HCO3 24.9 12/08/2018 1844   TCO2 22 12/08/2018 2123   ACIDBASEDEF 1.0 12/08/2018 1844   O2SAT 93.0 12/08/2018 1844   CBG (last 3)  Recent Labs    12/10/18 1149 12/10/18 1557 12/10/18 2155  GLUCAP 156* 170* 120*    Assessment/Plan: S/P Procedure(s) (LRB): AORTIC VALVE REPLACEMENT (AVR) (N/A) TRANSESOPHAGEAL ECHOCARDIOGRAM (TEE) (N/A) Replacement Ascending Aorta  SUPRACORONARY (N/A) Diuresis Plan for transfer to step-down: see transfer orders  Back in NSR on amio gtt   LOS: 3 days    Cody Hebert 12/11/2018

## 2018-12-12 ENCOUNTER — Inpatient Hospital Stay (HOSPITAL_COMMUNITY): Payer: 59

## 2018-12-12 LAB — CBC WITH DIFFERENTIAL/PLATELET
Abs Immature Granulocytes: 0.18 10*3/uL — ABNORMAL HIGH (ref 0.00–0.07)
Basophils Absolute: 0.1 10*3/uL (ref 0.0–0.1)
Basophils Relative: 1 %
Eosinophils Absolute: 0.3 10*3/uL (ref 0.0–0.5)
Eosinophils Relative: 3 %
HCT: 29.1 % — ABNORMAL LOW (ref 39.0–52.0)
Hemoglobin: 10 g/dL — ABNORMAL LOW (ref 13.0–17.0)
Immature Granulocytes: 2 %
Lymphocytes Relative: 20 %
Lymphs Abs: 2 10*3/uL (ref 0.7–4.0)
MCH: 32.2 pg (ref 26.0–34.0)
MCHC: 34.4 g/dL (ref 30.0–36.0)
MCV: 93.6 fL (ref 80.0–100.0)
Monocytes Absolute: 0.9 10*3/uL (ref 0.1–1.0)
Monocytes Relative: 9 %
Neutro Abs: 6.5 10*3/uL (ref 1.7–7.7)
Neutrophils Relative %: 65 %
Platelets: 202 10*3/uL (ref 150–400)
RBC: 3.11 MIL/uL — ABNORMAL LOW (ref 4.22–5.81)
RDW: 13.1 % (ref 11.5–15.5)
WBC: 9.9 10*3/uL (ref 4.0–10.5)
nRBC: 2.3 % — ABNORMAL HIGH (ref 0.0–0.2)

## 2018-12-12 LAB — POCT I-STAT, CHEM 8
BUN: 12 mg/dL (ref 6–20)
BUN: 11 mg/dL (ref 6–20)
BUN: 13 mg/dL (ref 6–20)
BUN: 13 mg/dL (ref 6–20)
BUN: 12 mg/dL (ref 6–20)
BUN: 14 mg/dL (ref 6–20)
BUN: 14 mg/dL (ref 6–20)
Calcium, Ion: 1.27 mmol/L (ref 1.15–1.40)
Calcium, Ion: 1.09 mmol/L — ABNORMAL LOW (ref 1.15–1.40)
Calcium, Ion: 1.21 mmol/L (ref 1.15–1.40)
Calcium, Ion: 1.18 mmol/L (ref 1.15–1.40)
Calcium, Ion: 1.09 mmol/L — ABNORMAL LOW (ref 1.15–1.40)
Calcium, Ion: 1.09 mmol/L — ABNORMAL LOW (ref 1.15–1.40)
Calcium, Ion: 1.15 mmol/L (ref 1.15–1.40)
Chloride: 105 mmol/L (ref 98–111)
Chloride: 104 mmol/L (ref 98–111)
Chloride: 106 mmol/L (ref 98–111)
Chloride: 104 mmol/L (ref 98–111)
Chloride: 105 mmol/L (ref 98–111)
Chloride: 106 mmol/L (ref 98–111)
Chloride: 105 mmol/L (ref 98–111)
Creatinine, Ser: 0.7 mg/dL (ref 0.61–1.24)
Creatinine, Ser: 0.6 mg/dL — ABNORMAL LOW (ref 0.61–1.24)
Creatinine, Ser: 0.6 mg/dL — ABNORMAL LOW (ref 0.61–1.24)
Creatinine, Ser: 0.8 mg/dL (ref 0.61–1.24)
Creatinine, Ser: 0.6 mg/dL — ABNORMAL LOW (ref 0.61–1.24)
Creatinine, Ser: 0.7 mg/dL (ref 0.61–1.24)
Creatinine, Ser: 0.8 mg/dL (ref 0.61–1.24)
Glucose, Bld: 102 mg/dL — ABNORMAL HIGH (ref 70–99)
Glucose, Bld: 101 mg/dL — ABNORMAL HIGH (ref 70–99)
Glucose, Bld: 95 mg/dL (ref 70–99)
Glucose, Bld: 133 mg/dL — ABNORMAL HIGH (ref 70–99)
Glucose, Bld: 147 mg/dL — ABNORMAL HIGH (ref 70–99)
Glucose, Bld: 134 mg/dL — ABNORMAL HIGH (ref 70–99)
Glucose, Bld: 142 mg/dL — ABNORMAL HIGH (ref 70–99)
HCT: 39 % (ref 39.0–52.0)
HCT: 33 % — ABNORMAL LOW (ref 39.0–52.0)
HCT: 37 % — ABNORMAL LOW (ref 39.0–52.0)
HCT: 30 % — ABNORMAL LOW (ref 39.0–52.0)
HCT: 29 % — ABNORMAL LOW (ref 39.0–52.0)
HCT: 28 % — ABNORMAL LOW (ref 39.0–52.0)
HCT: 31 % — ABNORMAL LOW (ref 39.0–52.0)
Hemoglobin: 13.3 g/dL (ref 13.0–17.0)
Hemoglobin: 11.2 g/dL — ABNORMAL LOW (ref 13.0–17.0)
Hemoglobin: 12.6 g/dL — ABNORMAL LOW (ref 13.0–17.0)
Hemoglobin: 10.2 g/dL — ABNORMAL LOW (ref 13.0–17.0)
Hemoglobin: 9.9 g/dL — ABNORMAL LOW (ref 13.0–17.0)
Hemoglobin: 9.5 g/dL — ABNORMAL LOW (ref 13.0–17.0)
Hemoglobin: 10.5 g/dL — ABNORMAL LOW (ref 13.0–17.0)
Potassium: 4.2 mmol/L (ref 3.5–5.1)
Potassium: 4.3 mmol/L (ref 3.5–5.1)
Potassium: 4.4 mmol/L (ref 3.5–5.1)
Potassium: 6.4 mmol/L (ref 3.5–5.1)
Potassium: 4.7 mmol/L (ref 3.5–5.1)
Potassium: 5.9 mmol/L — ABNORMAL HIGH (ref 3.5–5.1)
Potassium: 4.9 mmol/L (ref 3.5–5.1)
Sodium: 142 mmol/L (ref 135–145)
Sodium: 142 mmol/L (ref 135–145)
Sodium: 143 mmol/L (ref 135–145)
Sodium: 139 mmol/L (ref 135–145)
Sodium: 141 mmol/L (ref 135–145)
Sodium: 142 mmol/L (ref 135–145)
Sodium: 145 mmol/L (ref 135–145)
TCO2: 28 mmol/L (ref 22–32)
TCO2: 28 mmol/L (ref 22–32)
TCO2: 28 mmol/L (ref 22–32)
TCO2: 31 mmol/L (ref 22–32)
TCO2: 28 mmol/L (ref 22–32)
TCO2: 25 mmol/L (ref 22–32)
TCO2: 27 mmol/L (ref 22–32)

## 2018-12-12 LAB — POCT I-STAT 7, (LYTES, BLD GAS, ICA,H+H)
Acid-Base Excess: 1 mmol/L (ref 0.0–2.0)
Acid-base deficit: 1 mmol/L (ref 0.0–2.0)
Bicarbonate: 27.2 mmol/L (ref 20.0–28.0)
Bicarbonate: 25.7 mmol/L (ref 20.0–28.0)
Bicarbonate: 26.8 mmol/L (ref 20.0–28.0)
Bicarbonate: 27 mmol/L (ref 20.0–28.0)
Calcium, Ion: 1.27 mmol/L (ref 1.15–1.40)
Calcium, Ion: 1.06 mmol/L — ABNORMAL LOW (ref 1.15–1.40)
Calcium, Ion: 1.11 mmol/L — ABNORMAL LOW (ref 1.15–1.40)
Calcium, Ion: 1.13 mmol/L — ABNORMAL LOW (ref 1.15–1.40)
HCT: 40 % (ref 39.0–52.0)
HCT: 32 % — ABNORMAL LOW (ref 39.0–52.0)
HCT: 33 % — ABNORMAL LOW (ref 39.0–52.0)
HCT: 32 % — ABNORMAL LOW (ref 39.0–52.0)
Hemoglobin: 13.6 g/dL (ref 13.0–17.0)
Hemoglobin: 10.9 g/dL — ABNORMAL LOW (ref 13.0–17.0)
Hemoglobin: 11.2 g/dL — ABNORMAL LOW (ref 13.0–17.0)
Hemoglobin: 10.9 g/dL — ABNORMAL LOW (ref 13.0–17.0)
O2 Saturation: 100 %
O2 Saturation: 100 %
O2 Saturation: 87 %
O2 Saturation: 100 %
Potassium: 4.2 mmol/L (ref 3.5–5.1)
Potassium: 4.3 mmol/L (ref 3.5–5.1)
Potassium: 4.3 mmol/L (ref 3.5–5.1)
Potassium: 4.9 mmol/L (ref 3.5–5.1)
Sodium: 142 mmol/L (ref 135–145)
Sodium: 143 mmol/L (ref 135–145)
Sodium: 144 mmol/L (ref 135–145)
Sodium: 143 mmol/L (ref 135–145)
TCO2: 29 mmol/L (ref 22–32)
TCO2: 27 mmol/L (ref 22–32)
TCO2: 28 mmol/L (ref 22–32)
TCO2: 29 mmol/L (ref 22–32)
pCO2 arterial: 53.5 mmHg — ABNORMAL HIGH (ref 32.0–48.0)
pCO2 arterial: 51.9 mmHg — ABNORMAL HIGH (ref 32.0–48.0)
pCO2 arterial: 52.2 mmHg — ABNORMAL HIGH (ref 32.0–48.0)
pCO2 arterial: 51.1 mmHg — ABNORMAL HIGH (ref 32.0–48.0)
pH, Arterial: 7.314 — ABNORMAL LOW (ref 7.350–7.450)
pH, Arterial: 7.302 — ABNORMAL LOW (ref 7.350–7.450)
pH, Arterial: 7.318 — ABNORMAL LOW (ref 7.350–7.450)
pH, Arterial: 7.331 — ABNORMAL LOW (ref 7.350–7.450)
pO2, Arterial: 320 mmHg — ABNORMAL HIGH (ref 83.0–108.0)
pO2, Arterial: 346 mmHg — ABNORMAL HIGH (ref 83.0–108.0)
pO2, Arterial: 59 mmHg — ABNORMAL LOW (ref 83.0–108.0)
pO2, Arterial: 245 mmHg — ABNORMAL HIGH (ref 83.0–108.0)

## 2018-12-12 LAB — BASIC METABOLIC PANEL
Anion gap: 11 (ref 5–15)
BUN: 12 mg/dL (ref 6–20)
CO2: 26 mmol/L (ref 22–32)
Calcium: 8.4 mg/dL — ABNORMAL LOW (ref 8.9–10.3)
Chloride: 96 mmol/L — ABNORMAL LOW (ref 98–111)
Creatinine, Ser: 0.78 mg/dL (ref 0.61–1.24)
GFR calc Af Amer: >60 mL/min (ref >60)
GFR calc non Af Amer: >60 mL/min (ref >60)
Glucose, Bld: 116 mg/dL — ABNORMAL HIGH (ref 70–99)
Potassium: 3.4 mmol/L — ABNORMAL LOW (ref 3.5–5.1)
Sodium: 133 mmol/L — ABNORMAL LOW (ref 135–145)

## 2018-12-12 LAB — SURGICAL PATHOLOGY

## 2018-12-12 MED ORDER — POTASSIUM CHLORIDE CRYS ER 20 MEQ PO TBCR
20.0000 meq | EXTENDED_RELEASE_TABLET | Freq: Three times a day (TID) | ORAL | Status: AC
  Administered 2018-12-12 (×3): 20 meq via ORAL
  Filled 2018-12-12 (×3): qty 1

## 2018-12-12 MED ORDER — PHENOL 1.4 % MT LIQD
1.0000 | OROMUCOSAL | Status: DC | PRN
  Administered 2018-12-12: 1 via OROMUCOSAL
  Filled 2018-12-12: qty 177

## 2018-12-12 MED ORDER — AMIODARONE HCL 200 MG PO TABS
200.0000 mg | ORAL_TABLET | Freq: Two times a day (BID) | ORAL | Status: DC
  Administered 2018-12-12 – 2018-12-13 (×3): 200 mg via ORAL
  Filled 2018-12-12 (×3): qty 1

## 2018-12-12 NOTE — Progress Notes (Signed)
CARDIAC REHAB PHASE I   PRE:  Rate/Rhythm: 68 3rd Deg  BP:  Sitting: 106/74      SaO2: 98 RA  MODE:  Ambulation: 470 ft   POST:  Rate/Rhythm: 69  BP:  Sitting: 105/91    SaO2: 96 RA  Upon entering room pt c/o difficulty swallowing. Pt denies coughing after swallowing, states water and pills just feel like they get stuck. Pt also c/o dry mouth. Pt given oral rinse and gel. RN requesting throat spary. Pt then ambulated 449ft in hallway with one standing rest break. Pt c/o SOB, lightheadedness and leg weakness. O2 sats 96 on RA. Pt declined stopping walk halfway through as he wanted to finish the hallway. Encouraged pt to listen to his body and not overdo it. Encouraged a third walk later today and continue IS use. Will continue to follow.  Justice, RN BSN 12/12/2018 3:15 PM

## 2018-12-12 NOTE — Addendum Note (Signed)
Addendum  created 12/12/18 1054 by Josephine Igo, CRNA   Intraprocedure Meds edited

## 2018-12-12 NOTE — Progress Notes (Addendum)
Patient c/o dry throat and trouble swallowing, on observation patient was able to take pills crushed with apple sauce and sips of water, no sign of distress noted, [place] PA notified, Verbal order received for chloraseptic mouth spray, 1 spray as needed, will continue to monitor. Marland Kitchen

## 2018-12-12 NOTE — Progress Notes (Signed)
CARDIAC REHAB PHASE I   Offered to walk with pt. Pt states he has ambulated this morning and just got back into bed from using BR. Pt states nursing is coming back to removes some lines shortly. Encouraged continued IS use. Will return later to walk as time allows.  [id number] Rufina Falco, RN BSN 12/12/2018 10:11 AM

## 2018-12-12 NOTE — Progress Notes (Signed)
Patient education completed for removal of pacing wires, he verbalized understanding, pacing wires removed as ordered, patient now on bedrest will continue to monitor.

## 2018-12-12 NOTE — Progress Notes (Addendum)
4 Days Post-Op Procedure(s) (LRB): AORTIC VALVE REPLACEMENT (AVR) (N/A) TRANSESOPHAGEAL ECHOCARDIOGRAM (TEE) (N/A) Replacement Ascending Aorta SUPRACORONARY (N/A) Subjective: Transfer form CV[facility] yesterday. Awake and alert, sitting up in chair. Denies pain or shortness of breath. Tolerating diet, no BM yet.   Objective: Vital signs in last 24 hours: Temp:  [98.2 F (36.8 C)-98.9 F (37.2 C)] 98.2 F (36.8 C) (10/26 0457) Pulse Rate:  [65-96] 68 (10/26 0457) Cardiac Rhythm: Normal sinus rhythm;Bundle branch block;Heart block (10/25 2033) Resp:  [15-29] 21 (10/26 8299) BP: (96-128)/(64-84) 128/84 (10/26 0457) SpO2:  [94 %-99 %] 99 % (10/26 0457) Weight:  [86.3 kg] 86.3 kg (10/26 0614)     Intake/Output from previous day: 10/25 0701 - 10/26 0700 In: 1084.9 [P.O.:720; I.V.:364.9] Out: 1700 [Urine:1700] Intake/Output this shift: No intake/output data recorded.  General appearance: alert, cooperative and no distress Neurologic: intact Heart: RRR, expected flow murmur of bioprosthetic aortic valve.  Lungs: clear to auscultation bilaterally Abdomen: Soft and non-tender. Extremities: All well perfused, no peripheral edema. Wound: The sternotomy incision is intact and dry.   Lab Results: Recent Labs    12/11/18 0506 12/12/18 0514  WBC 12.9* 9.9  HGB 10.1* 10.0*  HCT 29.4* 29.1*  PLT 140* 202   BMET:  Recent Labs    12/11/18 0506 12/12/18 0514  NA 134* 133*  K 3.8 3.4*  CL 99 96*  CO2 26 26  GLUCOSE 122* 116*  BUN 11 12  CREATININE 0.77 0.78  CALCIUM 8.3* 8.4*    PT/INR: No results for input(s): LABPROT, INR in the last 72 hours. ABG    Component Value Date/Time   PHART 7.357 12/08/2018 1844   HCO3 24.9 12/08/2018 1844   TCO2 22 12/08/2018 2123   ACIDBASEDEF 1.0 12/08/2018 1844   O2SAT 93.0 12/08/2018 1844   CBG (last 3)  Recent Labs    12/10/18 1149 12/10/18 1557 12/10/18 2155  GLUCAP 156* 170* 120*    Assessment/Plan: S/P Procedure(s)  (LRB): AORTIC VALVE REPLACEMENT (AVR) (N/A) TRANSESOPHAGEAL ECHOCARDIOGRAM (TEE) (N/A) Replacement Ascending Aorta SUPRACORONARY (N/A)  -POD 4  AVR and replacement of the ascending aorta for severe bicuspid aortic valve stenosis and ascending aortic aneurysm.  Making excellent progress,  Nearly independent with mobility. BP and renal function stable.  D/C neck line and pacer wires.   -Post-op afib-  Converted to SR with initiation of IV  Amiodarone load. 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 Will convert amiodarone to 200mg  BID PO.  -Hypokalemia-replace, re-check in AM. He is near pre-op weight so will back off on diuresis.  -DVT PPX-continue enoxaparin.     LOS: 4 days    Antony Odea, Hershal Coria [phone] 12/12/2018    Chart reviewed, patient examined, agree with above. He is doing well overall. Postop atrial fib converted with amio. He has had intermittent type 1 second degree block postop but that should resolve.

## 2018-12-13 LAB — BPAM RBC
Blood Product Expiration Date: 202011212359
Blood Product Expiration Date: 202011212359
Blood Product Expiration Date: 202011272359
Blood Product Expiration Date: 202011282359
Blood Product Expiration Date: 202011282359
Blood Product Expiration Date: 202011282359
ISSUE DATE / TIME: 202010220949
ISSUE DATE / TIME: 202010220949
ISSUE DATE / TIME: 202010220949
ISSUE DATE / TIME: 202010220949
Unit Type and Rh: 600
Unit Type and Rh: 600
Unit Type and Rh: 600
Unit Type and Rh: 600
Unit Type and Rh: 600
Unit Type and Rh: 600

## 2018-12-13 LAB — BASIC METABOLIC PANEL
Anion gap: 12 (ref 5–15)
BUN: 14 mg/dL (ref 6–20)
CO2: 24 mmol/L (ref 22–32)
Calcium: 9 mg/dL (ref 8.9–10.3)
Chloride: 97 mmol/L — ABNORMAL LOW (ref 98–111)
Creatinine, Ser: 0.88 mg/dL (ref 0.61–1.24)
GFR calc Af Amer: >60 mL/min (ref >60)
GFR calc non Af Amer: >60 mL/min (ref >60)
Glucose, Bld: 124 mg/dL — ABNORMAL HIGH (ref 70–99)
Potassium: 3.7 mmol/L (ref 3.5–5.1)
Sodium: 133 mmol/L — ABNORMAL LOW (ref 135–145)

## 2018-12-13 LAB — TYPE AND SCREEN
ABO/RH(D): A NEG
Antibody Screen: NEGATIVE
Unit division: 0
Unit division: 0
Unit division: 0
Unit division: 0
Unit division: 0
Unit division: 0

## 2018-12-13 MED ORDER — ASPIRIN 325 MG PO TBEC: 325.0000 mg | DELAYED_RELEASE_TABLET | Freq: Every day | ORAL | 0 refills | Status: DC

## 2018-12-13 MED ORDER — TRAMADOL HCL 50 MG PO TABS: 50.0000 mg | ORAL_TABLET | Freq: Four times a day (QID) | ORAL | 0 refills | Status: AC | PRN

## 2018-12-13 MED ORDER — AMIODARONE HCL 200 MG PO TABS: 200.0000 mg | ORAL_TABLET | Freq: Two times a day (BID) | ORAL | 1 refills | Status: DC

## 2018-12-13 NOTE — Progress Notes (Addendum)
5 Days Post-Op Procedure(s) (LRB): AORTIC VALVE REPLACEMENT (AVR) (N/A) TRANSESOPHAGEAL ECHOCARDIOGRAM (TEE) (N/A) Replacement Ascending Aorta SUPRACORONARY (N/A) Subjective: Walking in the hall this AM. No new problems. Remains on RA without dyspnea. Tolerating PO's and had several BM's yesterday. Slightly below pre-op weight.  Had some difficulty swallowing yesterday afternoon due to "dry air". This has resolved.   Objective: Vital signs in last 24 hours: Temp:  [97.6 F (36.4 C)-99 F (37.2 C)] 98.6 F (37 C) (10/27 0436) Pulse Rate:  [64-71] 64 (10/27 0436) Resp:  [18-28] 18 (10/27 0436) BP: (105-130)/(61-108) 112/72 (10/27 0436) SpO2:  [95 %-100 %] 98 % (10/27 0436) Weight:  [84.9 kg] 84.9 kg (10/27 0436)   Intake/Output from previous day: 10/26 0701 - 10/27 0700 In: 812.8 [P.O.:730; I.V.:82.8] Out: 2440 [Urine:2440] Intake/Output this shift: No intake/output data recorded.  Physical Exam: General appearance: alert, cooperative and no distress Neurologic: intact Heart: RRR, expected flow murmur of bioprosthetic aortic valve.  Lungs: clear to auscultation bilaterally Abdomen: Soft and non-tender. Extremities: All well perfused, no peripheral edema. Wound: The sternotomy incision is intact and dry. IJ cath and pacer wire sites covered with  dry dressings.    Lab Results: Recent Labs    12/11/18 0506 12/12/18 0514  WBC 12.9* 9.9  HGB 10.1* 10.0*  HCT 29.4* 29.1*  PLT 140* 202   BMET:  Recent Labs    12/12/18 0514 12/13/18 0340  NA 133* 133*  K 3.4* 3.7  CL 96* 97*  CO2 26 24  GLUCOSE 116* 124*  BUN 12 14  CREATININE 0.78 0.88  CALCIUM 8.4* 9.0    PT/INR: No results for input(s): LABPROT, INR in the last 72 hours. ABG    Component Value Date/Time   PHART 7.357 12/08/2018 1844   HCO3 24.9 12/08/2018 1844   TCO2 22 12/08/2018 2123   ACIDBASEDEF 1.0 12/08/2018 1844   O2SAT 93.0 12/08/2018 1844   CBG (last 3)  Recent Labs    12/10/18 1149  12/10/18 1557 12/10/18 2155  GLUCAP 156* 170* 120*    Assessment/Plan: S/P Procedure(s) (LRB): AORTIC VALVE REPLACEMENT (AVR) (N/A) TRANSESOPHAGEAL ECHOCARDIOGRAM (TEE) (N/A) Replacement Ascending Aorta SUPRACORONARY (N/A)  -POD 5  AVR and replacement of the ascending aorta for severe bicuspid aortic valve stenosis and ascending aortic aneurysm.  Making excellent progress,  He is independent with mobility. BP and renal function stable. He feels he is ready to go home   -Post-op afib-  Converted to SR with initiation of IV  Amiodarone load. 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 Will continue amiodarone to 200mg  BID PO.  -Hypokalemia-replaced, K+ 3.7.He is below pre-op weight. Lasix discontinued.   -DVT PPX-continue enoxaparin.    -Possible discharge later today.    LOS: 5 days    Antony Odea, Hershal Coria [phone] 12/13/2018   Chart reviewed, patient examined, agree with above. He feels well.  Rhythm has been sinus with intermittent type 1 second degree block with HR consistent at 68. Will continue low dose amio without beta blocker for postop atrial fib.

## 2018-12-13 NOTE — Progress Notes (Signed)
CARDIAC REHAB PHASE I   Pt just received news of discharge. Pt excited and anxious to leave. Discharge education completed with pt. Pt educated on importance of showers and monitoring incisions daily. Encouraged continued IS use, walks, and sternal precautions. Pt given in-the-tube sheet along with heart healthy diet and smoking cessation tip sheet. Reviewed restrictions and exercise guidelines. Will refer to CRP II [place].   [id number] Rufina Falco, RN BSN 12/13/2018 9:04 AM

## 2018-12-13 NOTE — Progress Notes (Signed)
Discharge instructions given to patient. Medications and wound care reviewed. All questions answered. IV removed. Patient to be escorted home by wife.  Paulene Floor, RN  12/13/2018  12:11 PM

## 2018-12-14 LAB — GLUCOSE, CAPILLARY: Glucose-Capillary: 32 mg/dL — CL (ref 70–99)

## 2018-12-15 ENCOUNTER — Telehealth: Payer: Self-pay | Admitting: Cardiology

## 2018-12-15 NOTE — Telephone Encounter (Signed)
His heart has been beating pretty rapidly

## 2018-12-15 NOTE — Telephone Encounter (Signed)
Telephone call to patient. States heart beating rapidly. Does not know how fast or has taken his BP. Has taken all his meds as prescribed. Spoke with Dr Harriet Masson who asks that he come in tomorrow. Appointment made for 3:55 Fri 12/16/18.Pt aware and verbalized understanding.

## 2018-12-16 ENCOUNTER — Other Ambulatory Visit: Payer: Self-pay

## 2018-12-16 ENCOUNTER — Ambulatory Visit (INDEPENDENT_AMBULATORY_CARE_PROVIDER_SITE_OTHER): Payer: 59 | Admitting: Cardiology

## 2018-12-16 ENCOUNTER — Encounter: Payer: Self-pay | Admitting: Cardiology

## 2018-12-16 VITALS — BP 110/70 | HR 82 | Ht 69.0 in | Wt 185.2 lb

## 2018-12-16 DIAGNOSIS — E782 Mixed hyperlipidemia: Secondary | ICD-10-CM

## 2018-12-16 DIAGNOSIS — I350 Nonrheumatic aortic (valve) stenosis: Secondary | ICD-10-CM | POA: Diagnosis not present

## 2018-12-16 DIAGNOSIS — Z952 Presence of prosthetic heart valve: Secondary | ICD-10-CM

## 2018-12-16 DIAGNOSIS — Z95828 Presence of other vascular implants and grafts: Secondary | ICD-10-CM | POA: Diagnosis not present

## 2018-12-16 NOTE — Progress Notes (Signed)
Cardiology Office Note:    Date:  12/17/2018   ID:  Cody Hebert, DOB 07-16-1960, MRN [id number]  PCP:  System, Pcp Not In  Cardiologist:  Thomasene Ripple, DO  Electrophysiologist:  None   Referring MD: No ref. provider found   Follow up visit.  History of Present Illness:    Cody Hebert is a 58 y.o. 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  During his evaluation he reported he had been experiencing left-sided chest pain for a month.  His physical exam was remarkable with a low pitched mid ejection systolic murmur which was concerning for aortic stenosis.  An echocardiogram was ordered to assess the severity of the aortic stenosis.  He was able to undergo his echocardiogram which revealed a very severe aortic stenosis.   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 It was noted that he tolerated the procedure without difficulty and was taken to the SICU in stable condition.  He was extubated the evening of surgery.  During his stay in the SICU the patient did not require and inotropic or pressor support. He was noted to have type 1 second degree heart block and did have a temporary pacer which was removed successfully. He developed atrial fibrillation that was treated with IV amiodarone resulting in conversion back to NSR with episodic type 1 second degree heart block. The amiodarone was converted to the oral form and he was discharged on amiodarone.   The patient called the office on 12/15/2018 complaining of rapid heart rate. Therefore he was ask to come in to be evaluated.   Today in the office he states that this has resolved  and it was a one time occurrence. He reports that he is recovering well. He denies any chest pain, shortness of breath, lightheadedness and dizziness.  Past Medical History:  Diagnosis Date  . Aortic stenosis   . Ascending aorta dilatation (HCC)   . GERD (gastroesophageal reflux disease)   . Hyperlipidemia   . Pneumonia    hx in the past    Past Surgical History:  Procedure Laterality Date  . ANTERIOR CRUCIATE LIGAMENT REPAIR    . AORTIC VALVE REPLACEMENT N/A 12/08/2018   Procedure: AORTIC VALVE REPLACEMENT (AVR);  Surgeon: Alleen Borne, MD;  Location: [facility] OR;  Service: Open Heart Surgery;  Laterality: N/A;  . EYE SURGERY    . REPLACEMENT ASCENDING AORTA N/A 12/08/2018   Procedure: Replacement Ascending Aorta SUPRACORONARY;  Surgeon: Alleen Borne, MD;  Location: [facility] OR;  Service: Open Heart Surgery;  Laterality: N/A;  . RIGHT/LEFT HEART CATH AND CORONARY ANGIOGRAPHY N/A 11/22/2018   Procedure: RIGHT/LEFT HEART CATH AND CORONARY ANGIOGRAPHY;  Surgeon: Corky Crafts, MD;  Location: [facility] INVASIVE CV LAB;  Service: Cardiovascular;  Laterality: N/A;  . TEE WITHOUT CARDIOVERSION N/A 12/08/2018   Procedure: TRANSESOPHAGEAL ECHOCARDIOGRAM (TEE);  Surgeon: Alleen Borne, MD;  Location: [facility] OR;  Service: Open Heart Surgery;  Laterality: N/A;  . VASECTOMY      Current Medications: Current Meds  Medication Sig  . amiodarone (PACERONE) 200 MG tablet Take 1 tablet (200 mg total) by mouth 2 (two) times daily.  Marland Kitchen  aspirin EC 325 MG EC tablet Take 1 tablet (325 mg total) by mouth daily.  Marland Kitchen. atorvastatin (LIPITOR) 20 MG tablet Take 1 tablet (20 mg total) by mouth daily. (Patient taking differently: Take 20 mg by mouth at bedtime. )  . Coenzyme Q10 (COQ10) 100 MG CAPS Take 100 mg by mouth at bedtime.  Marland Kitchen. loratadine (CLARITIN) 10 MG tablet Take 10 mg by mouth daily.   . naproxen sodium (ALEVE) 220 MG tablet Take 440 mg by mouth 2 (two) times daily as needed (pain.).  Marland Kitchen. nitroGLYCERIN (NITROSTAT)  0.4 MG SL tablet Place 0.4 mg under the tongue every 5 (five) minutes x 3 doses as needed for chest pain.   Marland Kitchen. omeprazole (PRILOSEC) 20 MG capsule Take 20 mg by mouth daily.  . traMADol (ULTRAM) 50 MG tablet Take 1 tablet (50 mg total) by mouth every 6 (six) hours as needed for up to 5 days for moderate pain.     Allergies:   Doxycycline   Social History   Socioeconomic History  . Marital status: Married    Spouse name: Not on file  . Number of children: Not on file  . Years of education: Not on file  . Highest education level: Not on file  Occupational History  . Not on file  Social Needs  . Financial resource strain: Not on file  . Food insecurity    Worry: Not on file    Inability: Not on file  . Transportation needs    Medical: Not on file    Non-medical: Not on file  Tobacco Use  . Smoking status: Current Every Day Smoker    Packs/day: 1.50    Years: 40.00    Pack years: 60.00    Types: Cigarettes  . Smokeless tobacco: Never Used  Substance and Sexual Activity  . Alcohol use: Yes    Alcohol/week: 14.0 standard drinks    Types: 14 Cans of beer per week  . Drug use: Never  . Sexual activity: Not on file  Lifestyle  . Physical activity    Days per week: Not on file    Minutes per session: Not on file  . Stress: Not on file  Relationships  . Social Musicianconnections    Talks on phone: Not on file    Gets together: Not on file    Attends religious service: Not on file    Active member of club or organization: Not on file    Attends meetings of clubs or organizations: Not on file    Relationship status: Not on file  Other Topics Concern  . Not on file  Social History Narrative  . Not on file     Family History: The patient's family history includes Cancer in his father; Congestive Heart Failure in his maternal grandmother; Diabetes in his brother.  ROS:   Review of Systems  Constitution: Negative for decreased appetite, fever and weight gain.  HENT: Negative for  congestion, ear discharge, hoarse voice and sore throat.   Eyes: Negative for discharge, redness, vision loss in right eye and visual halos.  Cardiovascular: Negative for chest pain, dyspnea on exertion, leg swelling, orthopnea and palpitations.  Respiratory: Negative for cough, hemoptysis, shortness of breath and snoring.   Endocrine: Negative for heat intolerance and polyphagia.  Hematologic/Lymphatic: Negative for bleeding problem. Does not bruise/bleed easily.  Skin: Negative for flushing, nail changes, rash and suspicious lesions.  Musculoskeletal: Negative for arthritis, joint pain, muscle cramps, myalgias, neck pain and stiffness.  Gastrointestinal:  Negative for abdominal pain, bowel incontinence, diarrhea and excessive appetite.  Genitourinary: Negative for decreased libido, genital sores and incomplete emptying.  Neurological: Negative for brief paralysis, focal weakness, headaches and loss of balance.  Psychiatric/Behavioral: Negative for altered mental status, depression and suicidal ideas.  Allergic/Immunologic: Negative for HIV exposure and persistent infections.    EKGs/Labs/Other Studies Reviewed:    The following studies were reviewed today:   EKG:  The ekg ordered today demonstrates sinus rhythm, HR 82 bpm  Post operative TTE 12/08/2018. Complications: No known complications during this procedure. POST-OP IMPRESSIONS - Left Ventricle: The left ventricle is unchanged from pre-bypass. - Aorta: there is no dissection present in the aorta A graft was placed in the ascending aorta for repair. - Aortic Valve: No stenosis present. A bioprosthetic valve was placed, leaflets are not freely mobile. There is no regurgitation. No regurgitation post repair. The gradient recorded across the prosthetic valve is within the expected range, measuring 118 cm/s. - Mitral Valve: The mitral valve appears unchanged from pre-bypass. - Tricuspid Valve: The tricuspid valve appears unchanged  from pre-bypass.  Recent Labs: 12/05/2018: ALT 31 12/09/2018: Magnesium 2.3 12/12/2018: Hemoglobin 10.0; Platelets 202 12/13/2018: BUN 14; Creatinine, Ser 0.88; Potassium 3.7; Sodium 133  Recent Lipid Panel    Component Value Date/Time   CHOL 213 (H) 11/07/2018 0901   TRIG 111 11/07/2018 0901   HDL 45 11/07/2018 0901   CHOLHDL 4.7 11/07/2018 0901   LDLCALC 148 (H) 11/07/2018 0901    Physical Exam:    VS:  BP 110/70   Pulse 82   Ht  (1.753 m)   Wt 185 lb 3.2 oz (84 kg)   SpO2 97%   BMI 27.35 kg/m     Wt Readings from Last 3 Encounters:  12/16/18 185 lb 3.2 oz (84 kg)  12/13/18 187 lb 3.2 oz (84.9 kg)  12/05/18 187 lb 12.8 oz (85.2 kg)     GEN: Well nourished, well developed in no acute distress HEENT: Normal NECK: No JVD; No carotid bruits LYMPHATICS: No lymphadenopathy CARDIAC: S1S2 noted,RRR, 2/6 systolic murmurs, rubs, gallops CHEST: Sternal wound visible in mid-chest- hear suture appreciated no erythema or signs of infection. RESPIRATORY:  Clear to auscultation without rales, wheezing or rhonchi  ABDOMEN: Soft, non-tender, non-distended, +bowel sounds, no guarding. EXTREMITIES: No edema, No cyanosis, no clubbing MUSCULOSKELETAL:  No edema; No deformity  SKIN: Warm and dry NEUROLOGIC:  Alert and oriented x 3, non-focal PSYCHIATRIC:  Normal affect, good insight  ASSESSMENT:    1. Severe aortic valve stenosis   2. S/P ascending aortic replacement   3. S/P AVR (aortic valve replacement) and aortoplasty   4. Mixed hyperlipidemia    PLAN:    1. Cody Hebert appears to be doing well post operatively.  He has a follow up appointment with CT surgery next week.   2. I did review his post-operative TEE  With his bioprosthetic valve. He needs a comprehensive TTE post AVR.   3. In terms of his post op Afib - he will continue amiodarone for now. At his next visit I plan to take the patient off the amiodarone and then place a zio monitor to assess for any occurrence of  atrial fibrillation.   3. I have discussed cardiac rehab with the patient. Once he see CT surgery in follow up we will make arrangement for this.   4. Continue his current medication regimen.  The patient is in agreement with the above plan. The patient left the office  in stable condition.  The patient will follow up in 2 weeks.   Medication Adjustments/Labs and Tests Ordered: Current medicines are reviewed at length with the patient today.  Concerns regarding medicines are outlined above.  Orders Placed This Encounter  Procedures  . EKG 12-Lead   No orders of the defined types were placed in this encounter.   Patient Instructions  Medication Instructions:  Your physician recommends that you continue on your current medications as directed. Please refer to the Current Medication list given to you today.  *If you need a refill on your cardiac medications before your next appointment, please call your pharmacy*  Lab Work: None If you have labs (blood work) drawn today and your tests are completely normal, you will receive your results only by: Marland Kitchen MyChart Message (if you have MyChart) OR . A paper copy in the mail If you have any lab test that is abnormal or we need to change your treatment, we will call you to review the results.  Testing/Procedures: NOne  Follow-Up: At [facility], you and your health needs are our priority.  As part of our continuing mission to provide you with exceptional heart care, we have created designated Provider Care Teams.  These Care Teams include your primary Cardiologist (physician) and Advanced Practice Providers (APPs -  Physician Assistants and Nurse Practitioners) who all work together to provide you with the care you need, when you need it.  Your next appointment:   2 weeks  The format for your next appointment:   In Person  Provider:   Thomasene Ripple, DO  Other Instructions    Adopting a Healthy Lifestyle.  Know what a healthy  weight is for you (roughly BMI <25) and aim to maintain this   Aim for 7+ servings of fruits and vegetables daily   65-80+ fluid ounces of water or unsweet tea for healthy kidneys   Limit to max 1 drink of alcohol per day; avoid smoking/tobacco   Limit animal fats in diet for cholesterol and heart health - choose grass fed whenever available   Avoid highly processed foods, and foods high in saturated/trans fats   Aim for low stress - take time to unwind and care for your mental health   Aim for 150 min of moderate intensity exercise weekly for heart health, and weights twice weekly for bone health   Aim for 7-9 hours of sleep daily   When it comes to diets, agreement about the perfect plan isnt easy to find, even among the experts. Experts at the [facility] of [employer] developed an idea known as the Healthy Eating Plate. Just imagine a plate divided into logical, healthy portions.   The emphasis is on diet quality:   Load up on vegetables and fruits - one-half of your plate: Aim for color and variety, and remember that potatoes dont count.   Go for whole grains - one-quarter of your plate: Whole wheat, barley, wheat berries, quinoa, oats, brown rice, and foods made with them. If you want pasta, go with whole wheat pasta.   Protein power - one-quarter of your plate: Fish, chicken, beans, and nuts are all healthy, versatile protein sources. Limit red meat.   The diet, however, does go beyond the plate, offering a few other suggestions.   Use healthy plant oils, such as olive, canola, soy, corn, sunflower and peanut. Check the labels, and avoid partially hydrogenated oil, which have unhealthy trans fats.   If youre thirsty, drink water.  Coffee and tea are good in moderation, but skip sugary drinks and limit milk and dairy products to one or two daily servings.   The type of carbohydrate in the diet is more important than the amount. Some sources of carbohydrates, such as  vegetables, fruits, whole grains, and beans-are healthier than others.   Finally, stay active  Signed, Berniece Salines, DO  12/17/2018 9:53 AM    [facility]

## 2018-12-16 NOTE — Patient Instructions (Signed)
Medication Instructions:  Your physician recommends that you continue on your current medications as directed. Please refer to the Current Medication list given to you today.  *If you need a refill on your cardiac medications before your next appointment, please call your pharmacy*  Lab Work: None If you have labs (blood work) drawn today and your tests are completely normal, you will receive your results only by: Marland Kitchen MyChart Message (if you have MyChart) OR . A paper copy in the mail If you have any lab test that is abnormal or we need to change your treatment, we will call you to review the results.  Testing/Procedures: NOne  Follow-Up: At [facility] you and your health needs are our priority.  As part of our continuing mission to provide you with exceptional heart care, we have created designated Provider Care Teams.  These Care Teams include your primary Cardiologist (physician) and Advanced Practice Providers (APPs -  Physician Assistants and Nurse Practitioners) who all work together to provide you with the care you need, when you need it.  Your next appointment:   2 weeks  The format for your next appointment:   In Person  Provider:   Berniece Salines, DO  Other Instructions

## 2018-12-22 ENCOUNTER — Ambulatory Visit: Payer: 59 | Admitting: Cardiology

## 2018-12-26 ENCOUNTER — Ambulatory Visit: Payer: 59 | Admitting: Cardiology

## 2018-12-26 ENCOUNTER — Encounter (INDEPENDENT_AMBULATORY_CARE_PROVIDER_SITE_OTHER): Payer: Self-pay

## 2018-12-26 ENCOUNTER — Other Ambulatory Visit: Payer: Self-pay

## 2018-12-26 DIAGNOSIS — Z4802 Encounter for removal of sutures: Secondary | ICD-10-CM

## 2019-01-02 ENCOUNTER — Other Ambulatory Visit: Payer: Self-pay

## 2019-01-02 ENCOUNTER — Encounter: Payer: Self-pay | Admitting: Cardiology

## 2019-01-02 ENCOUNTER — Ambulatory Visit: Payer: 59 | Admitting: Cardiology

## 2019-01-02 ENCOUNTER — Ambulatory Visit (INDEPENDENT_AMBULATORY_CARE_PROVIDER_SITE_OTHER): Payer: 59 | Admitting: Cardiology

## 2019-01-02 VITALS — BP 110/62 | HR 110

## 2019-01-02 DIAGNOSIS — Z95828 Presence of other vascular implants and grafts: Secondary | ICD-10-CM

## 2019-01-02 DIAGNOSIS — I4891 Unspecified atrial fibrillation: Secondary | ICD-10-CM

## 2019-01-02 DIAGNOSIS — Z952 Presence of prosthetic heart valve: Secondary | ICD-10-CM

## 2019-01-02 DIAGNOSIS — I471 Supraventricular tachycardia, unspecified: Secondary | ICD-10-CM

## 2019-01-02 MED ORDER — AMIODARONE HCL 200 MG PO TABS: 200.0000 mg | ORAL_TABLET | Freq: Two times a day (BID) | ORAL | 1 refills | Status: DC

## 2019-01-02 MED ORDER — METOPROLOL SUCCINATE ER 25 MG PO TB24: 12.5000 mg | ORAL_TABLET | Freq: Every day | ORAL | 1 refills | Status: DC

## 2019-01-02 MED ORDER — ATORVASTATIN CALCIUM 20 MG PO TABS: 20.0000 mg | ORAL_TABLET | Freq: Every day | ORAL | 1 refills | Status: DC

## 2019-01-02 NOTE — Progress Notes (Signed)
Cardiology Office Note:    Date:  01/02/2019   ID:  Cody Hebert, DOB [birth year], MRN [id number]  PCP:  System, Pcp Not In  Cardiologist:  Thomasene Ripple, DO  Electrophysiologist:  None   Referring MD: No ref. provider found   Follow-up visit  History of Present Illness:    Cody Hebert is a 58 y.o. 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 His physical exam was remarkable with a low pitched mid ejection systolic murmur which was concerning for aortic stenosis. An echocardiogram was ordered to assess the severity of the aortic stenosis.He was able to undergo his echocardiogram which revealed a very severe aortic stenosis.   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 It was noted that he tolerated the procedure without difficulty and was taken to the SICU in stable condition. He was extubated the evening of surgery. During his stay in the SICU the patient did not require and inotropic or pressor support. He was noted to have type 1 second degree heart block and did have a temporary pacer which was removed successfully. He developed atrial fibrillation that was treated with IV amiodarone resulting in conversion back to NSR with episodic type 1 second degree heart block. The amiodarone was converted to the oral form and he was discharged on amiodarone.   The patient called the office on 12/15/2018 complaining of rapid heart rate. Therefore he was ask to come in to be evaluated.  I did see the patient on 12/16/2018 during no changes  were made to his medication. He was encourage to start cardiac rehab.   In the interim he has started cardiac rehab and has been doing well but today he was unable to participate in cardiac rehab due to an elevated heart rate.  He was then asked to see his cardiologist.  Past Medical History:  Diagnosis Date  . Aortic stenosis   . Ascending aorta dilatation (HCC)   . GERD (gastroesophageal reflux disease)   . Hyperlipidemia   . Pneumonia    hx in the past    Past Surgical History:  Procedure Laterality Date  . ANTERIOR CRUCIATE LIGAMENT REPAIR    . AORTIC VALVE REPLACEMENT N/A 12/08/2018   Procedure: AORTIC VALVE REPLACEMENT (AVR);  Surgeon: Alleen Borne, MD;  Location: [facility] OR;  Service: Open Heart Surgery;  Laterality: N/A;  . EYE SURGERY    . REPLACEMENT ASCENDING AORTA N/A 12/08/2018   Procedure: Replacement Ascending Aorta SUPRACORONARY;  Surgeon: Alleen Borne, MD;  Location: [facility] OR;  Service: Open Heart Surgery;  Laterality: N/A;  . RIGHT/LEFT HEART CATH AND CORONARY ANGIOGRAPHY N/A 11/22/2018   Procedure: RIGHT/LEFT HEART CATH AND CORONARY ANGIOGRAPHY;  Surgeon: Corky Crafts, MD;  Location: [facility] INVASIVE CV LAB;  Service: Cardiovascular;  Laterality: N/A;  . TEE WITHOUT CARDIOVERSION N/A 12/08/2018   Procedure: TRANSESOPHAGEAL ECHOCARDIOGRAM (TEE);  Surgeon: Alleen Borne, MD;  Location: [facility] OR;  Service: Open Heart Surgery;  Laterality: N/A;  . VASECTOMY      Current Medications: No outpatient medications have been marked as taking for  the 01/02/19 encounter (Office Visit) with Berniece Salines, DO.     Allergies:   Doxycycline   Social History   Socioeconomic History  . Marital status: Married    Spouse name: Not on file  . Number of children: Not on file  . Years of education: Not on file  . Highest education level: Not on file  Occupational History  . Not on file  Social Needs  . Financial resource strain: Not on file  . Food insecurity    Worry: Not on  file    Inability: Not on file  . Transportation needs    Medical: Not on file    Non-medical: Not on file  Tobacco Use  . Smoking status: Current Every Day Smoker    Packs/day: 1.50    Years: 40.00    Pack years: 60.00    Types: Cigarettes  . Smokeless tobacco: Never Used  Substance and Sexual Activity  . Alcohol use: Yes    Alcohol/week: 14.0 standard drinks    Types: 14 Cans of beer per week  . Drug use: Never  . Sexual activity: Not on file  Lifestyle  . Physical activity    Days per week: Not on file    Minutes per session: Not on file  . Stress: Not on file  Relationships  . Social Herbalist on phone: Not on file    Gets together: Not on file    Attends religious service: Not on file    Active member of club or organization: Not on file    Attends meetings of clubs or organizations: Not on file    Relationship status: Not on file  Other Topics Concern  . Not on file  Social History Narrative  . Not on file     Family History: The patient's family history includes Cancer in his father; Congestive Heart Failure in his maternal grandmother; Diabetes in his brother.  ROS:   Review of Systems  Constitution: Negative for decreased appetite, fever and weight gain.  HENT: Negative for congestion, ear discharge, hoarse voice and sore throat.   Eyes: Negative for discharge, redness, vision loss in right eye and visual halos.  Cardiovascular: Negative for chest pain, dyspnea on exertion, leg swelling, orthopnea and palpitations.  Respiratory: Negative for cough, hemoptysis, shortness of breath and snoring.   Endocrine: Negative for heat intolerance and polyphagia.  Hematologic/Lymphatic: Negative for bleeding problem. Does not bruise/bleed easily.  Skin: Negative for flushing, nail changes, rash and suspicious lesions.  Musculoskeletal: Negative for arthritis, joint pain, muscle cramps, myalgias, neck pain and stiffness.  Gastrointestinal: Negative for  abdominal pain, bowel incontinence, diarrhea and excessive appetite.  Genitourinary: Negative for decreased libido, genital sores and incomplete emptying.  Neurological: Negative for brief paralysis, focal weakness, headaches and loss of balance.  Psychiatric/Behavioral: Negative for altered mental status, depression and suicidal ideas.  Allergic/Immunologic: Negative for HIV exposure and persistent infections.    EKGs/Labs/Other Studies Reviewed:    The following studies were reviewed today:   EKG:  None   Recent Labs: 12/05/2018: ALT 31 12/09/2018: Magnesium 2.3 12/12/2018: Hemoglobin 10.0; Platelets 202 12/13/2018: BUN 14; Creatinine, Ser 0.88; Potassium 3.7; Sodium 133  Recent Lipid Panel    Component Value Date/Time   CHOL 213 (H) 11/07/2018 0901   TRIG 111 11/07/2018 0901   HDL 45 11/07/2018 0901   CHOLHDL 4.7 11/07/2018 0901   LDLCALC 148 (H) 11/07/2018 0901    Physical Exam:    VS:  BP 110/62   Pulse (!) 110     Wt Readings from Last 3 Encounters:  12/16/18 185 lb 3.2 oz (84 kg)  12/13/18 187 lb 3.2 oz (84.9 kg)  12/05/18 187 lb 12.8 oz (85.2 kg)     GEN: Well nourished, well developed in no acute distress HEENT: Normal NECK: No JVD; No carotid bruits LYMPHATICS: No lymphadenopathy CARDIAC: S1S2 noted,RRR, no murmurs, rubs, gallops RESPIRATORY:  Clear to auscultation without rales, wheezing or rhonchi  ABDOMEN: Soft, non-tender, non-distended, +bowel sounds, no guarding. EXTREMITIES: No edema, No cyanosis, no clubbing MUSCULOSKELETAL:  No edema; No deformity  SKIN: Warm and dry NEUROLOGIC:  Alert and oriented x 3, non-focal PSYCHIATRIC:  Normal affect, good insight  ASSESSMENT:    1. S/P AVR (aortic valve replacement) and aortoplasty   2. S/P ascending aortic replacement   3. Paroxysmal SVT (supraventricular tachycardia) (HCC)   4. Post operative atrial fibrillation    PLAN:    1. Patient was unable to his attending cardiac rehab today as his  resting heart rate was elevated at 140 bpm review of the strip stands from the cardiac rehab center patient is in sinus rhythm..  He is here for follow-up visit.  At this time his heart rate is still elevated.  At this time I am going to start patient on metoprolol succinate 12.5 mg daily.  He was educated about this new medication.  He will follow-up in 1 month  2.  Refills of his medications will be sent to the pharmacy.  The patient is in agreement with the above plan. The patient left the office in stable condition.  The patient will follow up in 1 month.   Medication Adjustments/Labs and Tests Ordered: Current medicines are reviewed at length with the patient today.  Concerns regarding medicines are outlined above.  No orders of the defined types were placed in this encounter.  Meds ordered this encounter  Medications  . metoprolol succinate (TOPROL XL) 25 MG 24 hr tablet    Sig: Take 0.5 tablets (12.5 mg total) by mouth daily.    Dispense:  45 tablet    Refill:  1  . amiodarone (PACERONE) 200 MG tablet    Sig: Take 1 tablet (200 mg total) by mouth 2 (two) times daily.    Dispense:  180 tablet    Refill:  1  . atorvastatin (LIPITOR) 20 MG tablet    Sig: Take 1 tablet (20 mg total) by mouth at bedtime.    Dispense:  90 tablet    Refill:  1    Patient Instructions  Medication Instructions:  Your physician has recommended you make the following change in your medication:   START: Toprol XL(metoprolol XL) 25 mg Take 1/2 tab daily  *If you need a refill on your cardiac medications before your next appointment, please call your pharmacy*  Lab Work: nOne If you have labs (blood work) drawn today and your tests are completely normal, you will receive your results only by: Marland Kitchen MyChart Message (if you have MyChart) OR . A paper copy in the mail If you have any lab test that is abnormal or we need to change your treatment, we will call you to review the results.  Testing/Procedures:  nOne  Follow-Up: At [facility], you and your health needs are our priority.  As part of our continuing mission to provide you with exceptional heart care, we have created designated Provider Care Teams.  These Care Teams include your primary Cardiologist (  physician) and Advanced Practice Providers (APPs -  Physician Assistants and Nurse Practitioners) who all work together to provide you with the care you need, when you need it.  Your next appointment:   1 month  The format for your next appointment:   In Person  Provider:   Thomasene RippleKardie Edmon Magid, DO  Other Instructions      Adopting a Healthy Lifestyle.  Know what a healthy weight is for you (roughly BMI <25) and aim to maintain this   Aim for 7+ servings of fruits and vegetables daily   65-80+ fluid ounces of water or unsweet tea for healthy kidneys   Limit to max 1 drink of alcohol per day; avoid smoking/tobacco   Limit animal fats in diet for cholesterol and heart health - choose grass fed whenever available   Avoid highly processed foods, and foods high in saturated/trans fats   Aim for low stress - take time to unwind and care for your mental health   Aim for 150 min of moderate intensity exercise weekly for heart health, and weights twice weekly for bone health   Aim for 7-9 hours of sleep daily   When it comes to diets, agreement about the perfect plan isnt easy to find, even among the experts. Experts at the [employer] developed an idea known as the Healthy Eating Plate. Just imagine a plate divided into logical, healthy portions.   The emphasis is on diet quality:   Load up on vegetables and fruits - one-half of your plate: Aim for color and variety, and remember that potatoes dont count.   Go for whole grains - one-quarter of your plate: Whole wheat, barley, wheat berries, quinoa, oats, brown rice, and foods made with them. If you want pasta, go with whole wheat pasta.   Protein power -  one-quarter of your plate: Fish, chicken, beans, and nuts are all healthy, versatile protein sources. Limit red meat.   The diet, however, does go beyond the plate, offering a few other suggestions.   Use healthy plant oils, such as olive, canola, soy, corn, sunflower and peanut. Check the labels, and avoid partially hydrogenated oil, which have unhealthy trans fats.   If youre thirsty, drink water. Coffee and tea are good in moderation, but skip sugary drinks and limit milk and dairy products to one or two daily servings.   The type of carbohydrate in the diet is more important than the amount. Some sources of carbohydrates, such as vegetables, fruits, whole grains, and beans-are healthier than others.   Finally, stay active  Signed, Thomasene RippleKardie Gavina Dildine, DO  01/02/2019 9:09 PM    [facility]

## 2019-01-02 NOTE — Patient Instructions (Signed)
Medication Instructions:  Your physician has recommended you make the following change in your medication:   START: Toprol XL(metoprolol XL) 25 mg Take 1/2 tab daily  *If you need a refill on your cardiac medications before your next appointment, please call your pharmacy*  Lab Work: nOne If you have labs (blood work) drawn today and your tests are completely normal, you will receive your results only by: Marland Kitchen MyChart Message (if you have MyChart) OR . A paper copy in the mail If you have any lab test that is abnormal or we need to change your treatment, we will call you to review the results.  Testing/Procedures: nOne  Follow-Up: At [facility], you and your health needs are our priority.  As part of our continuing mission to provide you with exceptional heart care, we have created designated Provider Care Teams.  These Care Teams include your primary Cardiologist (physician) and Advanced Practice Providers (APPs -  Physician Assistants and Nurse Practitioners) who all work together to provide you with the care you need, when you need it.  Your next appointment:   1 month  The format for your next appointment:   In Person  Provider:   Berniece Salines, DO  Other Instructions

## 2019-01-03 ENCOUNTER — Ambulatory Visit: Payer: 59 | Admitting: Cardiology

## 2019-01-04 ENCOUNTER — Ambulatory Visit: Payer: 59 | Admitting: Surgery

## 2019-01-09 ENCOUNTER — Other Ambulatory Visit: Payer: Self-pay

## 2019-01-09 ENCOUNTER — Ambulatory Visit (INDEPENDENT_AMBULATORY_CARE_PROVIDER_SITE_OTHER): Payer: 59

## 2019-01-09 ENCOUNTER — Telehealth: Payer: Self-pay | Admitting: *Deleted

## 2019-01-09 ENCOUNTER — Other Ambulatory Visit: Payer: Self-pay | Admitting: Surgery

## 2019-01-09 ENCOUNTER — Ambulatory Visit (INDEPENDENT_AMBULATORY_CARE_PROVIDER_SITE_OTHER): Payer: 59 | Admitting: Cardiology

## 2019-01-09 ENCOUNTER — Encounter: Payer: Self-pay | Admitting: Cardiology

## 2019-01-09 ENCOUNTER — Other Ambulatory Visit: Payer: Self-pay | Admitting: Physician Assistant

## 2019-01-09 VITALS — BP 124/80 | HR 89 | Ht 69.0 in | Wt 187.0 lb

## 2019-01-09 DIAGNOSIS — I10 Essential (primary) hypertension: Secondary | ICD-10-CM

## 2019-01-09 DIAGNOSIS — I480 Paroxysmal atrial fibrillation: Secondary | ICD-10-CM

## 2019-01-09 DIAGNOSIS — Z95828 Presence of other vascular implants and grafts: Secondary | ICD-10-CM

## 2019-01-09 DIAGNOSIS — E782 Mixed hyperlipidemia: Secondary | ICD-10-CM | POA: Diagnosis not present

## 2019-01-09 HISTORY — DX: Essential (primary) hypertension: I10

## 2019-01-09 HISTORY — DX: Paroxysmal atrial fibrillation: I48.0

## 2019-01-09 MED ORDER — METOPROLOL SUCCINATE ER 25 MG PO TB24: 25.0000 mg | ORAL_TABLET | Freq: Every day | ORAL | 1 refills | Status: DC

## 2019-01-09 MED ORDER — CARVEDILOL 25 MG PO TABS: 25.0000 mg | ORAL_TABLET | Freq: Two times a day (BID) | ORAL | 3 refills | Status: DC

## 2019-01-09 NOTE — Patient Instructions (Signed)
Medication Instructions:  Your physician has recommended you make the following change in your medication:   INCREASE: Toprol XL( metoprolol succinate)  25 mg Take 1 Tab daily  *If you need a refill on your cardiac medications before your next appointment, please call your pharmacy*  Lab Work: None If you have labs (blood work) drawn today and your tests are completely normal, you will receive your results only by: Marland Kitchen MyChart Message (if you have MyChart) OR . A paper copy in the mail If you have any lab test that is abnormal or we need to change your treatment, we will call you to review the results.  Testing/Procedures: A zio monitor was placed today. It will remain on for 14 days. You will then return monitor and event diary in provided box. It takes 1-2 weeks for report to be downloaded and returned to [place]. We will call you with the results. If monitor falls off or has orange flashing light, please call Zio for further instructions.     Follow-Up: At [facility], you and your health needs are our priority.  As part of our continuing mission to provide you with exceptional heart care, we have created designated Provider Care Teams.  These Care Teams include your primary Cardiologist (physician) and Advanced Practice Providers (APPs -  Physician Assistants and Nurse Practitioners) who all work together to provide you with the care you need, when you need it.  Your next appointment:   1 month(s)  The format for your next appointment:   In Person  Provider:   Berniece Salines, DO  Other Instructions

## 2019-01-09 NOTE — Addendum Note (Signed)
Addended by: Particia Nearing B on: 01/09/2019 01:36 PM   Modules accepted: Orders

## 2019-01-09 NOTE — Progress Notes (Addendum)
Cardiology Office Note:    Date:  01/09/2019   ID:  Cody Hebert, DOB [birth year], MRN [id number]  PCP:  System, Pcp Not In  Cardiologist:  Thomasene Ripple, DO  Electrophysiologist:  None   Referring MD: No ref. provider found   Chief Complaint  Patient presents with  . Atrial Fibrillation   History of Present Illness:    Cody Hebert is a 58 y.o. 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 His physical exam was remarkable with a low pitched mid ejection systolic murmur which was concerning for aortic stenosis. An echocardiogram was ordered to assess the severity of the aortic stenosis.He was able to undergo his echocardiogram which revealed a very severe aortic stenosis.   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 It was noted that he tolerated the procedure without difficulty and was taken to the SICU in stable condition. He was extubated the evening of surgery. During his stay in the SICU the patient did not require and inotropic or pressor support. He was noted to have type 1 second degree heart block and did have a temporary pacer which was removed successfully. He developed atrial fibrillation that was treated with IV amiodarone resulting in conversion back to NSR with episodic type 1 second degree heart block. The amiodarone was converted to the oral form and he was discharged on amiodarone.   Recently saw the patient on 01/02/2019 at which time his heart rate was elevated at rehab therefore he was asked to come and see his  cardiologist.  During our office visit I started patient on Toprol-XL 12.5 mg daily and continue his amiodarone 200 mg twice a day.  He is here for follow-up visit as the EKG was taken again at rehab on 01/04/2019 which showed atrial fibrillation with slow ventricular rate.  The patient is here with his wife he denies any shortness of breath, chest pain, any heart palpitations.   Past Medical History:  Diagnosis Date  . Aortic stenosis   . Ascending aorta dilatation (HCC)   . GERD (gastroesophageal reflux disease)   . Hyperlipidemia   . Pneumonia    hx in the past    Past Surgical History:  Procedure Laterality Date  . ANTERIOR CRUCIATE LIGAMENT REPAIR    . AORTIC VALVE REPLACEMENT N/A 12/08/2018   Procedure: AORTIC VALVE REPLACEMENT (AVR);  Surgeon: Alleen Borne, MD;  Location[facility] OR;  Service: Open Heart Surgery;  Laterality: N/A;  . EYE SURGERY    . REPLACEMENT ASCENDING AORTA N/A 12/08/2018   Procedure: Replacement Ascending Aorta SUPRACORONARY;  Surgeon: Alleen Borne, MD;  Location: [facility] OR;  Service: Open Heart Surgery;  Laterality: N/A;  . RIGHT/LEFT HEART CATH AND CORONARY ANGIOGRAPHY N/A 11/22/2018   Procedure: RIGHT/LEFT HEART CATH AND CORONARY ANGIOGRAPHY;  Surgeon: Corky Crafts, MD;  Location: [facility] INVASIVE CV LAB;  Service: Cardiovascular;  Laterality: N/A;  . TEE WITHOUT CARDIOVERSION N/A 12/08/2018   Procedure: TRANSESOPHAGEAL ECHOCARDIOGRAM (TEE);  Surgeon: Alleen Borne, MD;  Location: [facility] OR;  Service: Open Heart Surgery;  Laterality: N/A;  .  VASECTOMY      Current Medications: Current Meds  Medication Sig  . amiodarone (PACERONE) 200 MG tablet Take 1 tablet (200 mg total) by mouth 2 (two) times daily.  Marland Kitchen aspirin EC 325 MG EC tablet Take 1 tablet (325 mg total) by mouth daily.  Marland Kitchen atorvastatin (LIPITOR) 20 MG tablet Take 1 tablet (20 mg total) by mouth at bedtime.  . Coenzyme Q10 (COQ10) 100 MG CAPS Take 100 mg by mouth at bedtime.  Marland Kitchen loratadine  (CLARITIN) 10 MG tablet Take 10 mg by mouth daily.   . naproxen sodium (ALEVE) 220 MG tablet Take 440 mg by mouth 2 (two) times daily as needed (pain.).  Marland Kitchen nitroGLYCERIN (NITROSTAT) 0.4 MG SL tablet Place 0.4 mg under the tongue every 5 (five) minutes x 3 doses as needed for chest pain.   Marland Kitchen omeprazole (PRILOSEC) 20 MG capsule Take 20 mg by mouth daily.  . [DISCONTINUED] metoprolol succinate (TOPROL XL) 25 MG 24 hr tablet Take 0.5 tablets (12.5 mg total) by mouth daily.     Allergies:   Doxycycline   Social History   Socioeconomic History  . Marital status: Married    Spouse name: Not on file  . Number of children: Not on file  . Years of education: Not on file  . Highest education level: Not on file  Occupational History  . Not on file  Social Needs  . Financial resource strain: Not on file  . Food insecurity    Worry: Not on file    Inability: Not on file  . Transportation needs    Medical: Not on file    Non-medical: Not on file  Tobacco Use  . Smoking status: Current Every Day Smoker    Packs/day: 1.50    Years: 40.00    Pack years: 60.00    Types: Cigarettes  . Smokeless tobacco: Never Used  Substance and Sexual Activity  . Alcohol use: Yes    Alcohol/week: 14.0 standard drinks    Types: 14 Cans of beer per week  . Drug use: Never  . Sexual activity: Not on file  Lifestyle  . Physical activity    Days per week: Not on file    Minutes per session: Not on file  . Stress: Not on file  Relationships  . Social Musician on phone: Not on file    Gets together: Not on file    Attends religious service: Not on file    Active member of club or organization: Not on file    Attends meetings of clubs or organizations: Not on file    Relationship status: Not on file  Other Topics Concern  . Not on file  Social History Narrative  . Not on file     Family History: The patient's family history includes Cancer in his father; Congestive Heart Failure in his  maternal grandmother; Diabetes in his brother.  ROS:   Review of Systems  Constitution: Negative for decreased appetite, fever and weight gain.  HENT: Negative for congestion, ear discharge, hoarse voice and sore throat.   Eyes: Negative for discharge, redness, vision loss in right eye and visual halos.  Cardiovascular: Negative for chest pain, dyspnea on exertion, leg swelling, orthopnea and palpitations.  Respiratory: Negative for cough, hemoptysis, shortness of breath and snoring.   Endocrine: Negative for heat intolerance and polyphagia.  Hematologic/Lymphatic: Negative for bleeding problem. Does not bruise/bleed easily.  Skin: Negative for flushing, nail changes, rash and suspicious lesions.  Musculoskeletal: Negative for arthritis, joint pain, muscle cramps, myalgias, neck pain and stiffness.  Gastrointestinal: Negative for abdominal pain, bowel incontinence, diarrhea and excessive appetite.  Genitourinary: Negative for decreased libido, genital sores and incomplete emptying.  Neurological: Negative for brief paralysis, focal weakness, headaches and loss of balance.  Psychiatric/Behavioral: Negative for altered mental status, depression and suicidal ideas.  Allergic/Immunologic: Negative for HIV exposure and persistent infections.    EKGs/Labs/Other Studies Reviewed:    The following studies were reviewed today:   EKG:  The ekg ordered today demonstrates sinus rhythm with frequent premature atrial complexes.  Heart rate 89 bpm.  Recent Labs: 12/05/2018: ALT 31 12/09/2018: Magnesium 2.3 12/12/2018: Hemoglobin 10.0; Platelets 202 12/13/2018: BUN 14; Creatinine, Ser 0.88; Potassium 3.7; Sodium 133  Recent Lipid Panel    Component Value Date/Time   CHOL 213 (H) 11/07/2018 0901   TRIG 111 11/07/2018 0901   HDL 45 11/07/2018 0901   CHOLHDL 4.7 11/07/2018 0901   LDLCALC 148 (H) 11/07/2018 0901    Physical Exam:    VS:  BP 124/80 (BP Location: Left Arm, Patient Position:  Sitting, Cuff Size: Normal)   Pulse 89   Ht 5\' 9"  (1.753 m)   Wt 187 lb (84.8 kg)   SpO2 99%   BMI 27.62 kg/m     Wt Readings from Last 3 Encounters:  01/09/19 187 lb (84.8 kg)  12/16/18 185 lb 3.2 oz (84 kg)  12/13/18 187 lb 3.2 oz (84.9 kg)     GEN: Well nourished, well developed in no acute distress HEENT: Normal NECK: No JVD; No carotid bruits LYMPHATICS: No lymphadenopathy CARDIAC: S1S2 noted,RRR, no murmurs, rubs, gallops RESPIRATORY:  Clear to auscultation without rales, wheezing or rhonchi  ABDOMEN: Soft, non-tender, non-distended, +bowel sounds, no guarding. EXTREMITIES: No edema, No cyanosis, no clubbing MUSCULOSKELETAL:  No edema; No deformity  SKIN: Warm and dry NEUROLOGIC:  Alert and oriented x 3, non-focal PSYCHIATRIC:  Normal affect, good insight  ASSESSMENT:    1. Post operative atrial fibrillation   2. Essential hypertension   3. Mixed hyperlipidemia    PLAN:    1.  Discussed with the patient and his wife at the his visit today with great detail.  He is in sinus rhythm today with PACs.  Therefore I would like to keep him on amiodarone 200 mg twice daily and will increase his Toprol XL to 25 mg daily.  I also discussed the possibility of starting anticoagulation with him for stroke prevention.  Although his CHA2DS2-VASc score is 1 considering hypertension in the setting of his recent bioprosthetic valve replacement he may benefit from stroke prevention as well as planning if there is any need for TEE cardioversion in the near future.  For now patient prefers to hold off on any anticoagulation I did explain the risk and benefit to him.  I will place 14-day monitor on the patient to understand the A. fib burden.   2.  Status post aortic valve replacement continue with aspirin 325 mg daily for now.  3.  Hyperlipidemia-continue with atorvastatin 20 mg daily.  The patient is in agreement with the above plan. The patient left the office in stable condition.  The  patient will follow up in 1 month.   Medication Adjustments/Labs and Tests Ordered: Current medicines are reviewed at length with the patient today.  Concerns regarding medicines are outlined above.  Orders Placed This Encounter  Procedures  . LONG TERM MONITOR (3-14 DAYS)  . EKG 12-Lead   Meds  ordered this encounter  Medications  . DISCONTD: carvedilol (COREG) 25 MG tablet    Sig: Take 1 tablet (25 mg total) by mouth 2 (two) times daily.    Dispense:  180 tablet    Refill:  3  . metoprolol succinate (TOPROL XL) 25 MG 24 hr tablet    Sig: Take 1 tablet (25 mg total) by mouth daily.    Dispense:  90 tablet    Refill:  1    Patient Instructions  Medication Instructions:  Your physician has recommended you make the following change in your medication:   INCREASE: Toprol XL( metoprolol succinate)  25 mg Take 1 Tab daily  *If you need a refill on your cardiac medications before your next appointment, please call your pharmacy*  Lab Work: None If you have labs (blood work) drawn today and your tests are completely normal, you will receive your results only by: Marland Kitchen MyChart Message (if you have MyChart) OR . A paper copy in the mail If you have any lab test that is abnormal or we need to change your treatment, we will call you to review the results.  Testing/Procedures: A zio monitor was placed today. It will remain on for 14 days. You will then return monitor and event diary in provided box. It takes 1-2 weeks for report to be downloaded and returned to [place]. We will call you with the results. If monitor falls off or has orange flashing light, please call Zio for further instructions.     Follow-Up: At [facility], you and your health needs are our priority.  As part of our continuing mission to provide you with exceptional heart care, we have created designated Provider Care Teams.  These Care Teams include your primary Cardiologist (physician) and Advanced Practice Providers  (APPs -  Physician Assistants and Nurse Practitioners) who all work together to provide you with the care you need, when you need it.  Your next appointment:   1 month(s)  The format for your next appointment:   In Person  Provider:   Thomasene Ripple, DO  Other Instructions      Adopting a Healthy Lifestyle.  Know what a healthy weight is for you (roughly BMI <25) and aim to maintain this   Aim for 7+ servings of fruits and vegetables daily   65-80+ fluid ounces of water or unsweet tea for healthy kidneys   Limit to max 1 drink of alcohol per day; avoid smoking/tobacco   Limit animal fats in diet for cholesterol and heart health - choose grass fed whenever available   Avoid highly processed foods, and foods high in saturated/trans fats   Aim for low stress - take time to unwind and care for your mental health   Aim for 150 min of moderate intensity exercise weekly for heart health, and weights twice weekly for bone health   Aim for 7-9 hours of sleep daily   When it comes to diets, agreement about the perfect plan isnt easy to find, even among the experts. Experts at the [facility] [employer] developed an idea known as the Healthy Eating Plate. Just imagine a plate divided into logical, healthy portions.   The emphasis is on diet quality:   Load up on vegetables and fruits - one-half of your plate: Aim for color and variety, and remember that potatoes dont count.   Go for whole grains - one-quarter of your plate: Whole wheat, barley, wheat berries, quinoa, oats, brown rice, and  foods made with them. If you want pasta, go with whole wheat pasta.   Protein power - one-quarter of your plate: Fish, chicken, beans, and nuts are all healthy, versatile protein sources. Limit red meat.   The diet, however, does go beyond the plate, offering a few other suggestions.   Use healthy plant oils, such as olive, canola, soy, corn, sunflower and peanut. Check the labels, and  avoid partially hydrogenated oil, which have unhealthy trans fats.   If youre thirsty, drink water. Coffee and tea are good in moderation, but skip sugary drinks and limit milk and dairy products to one or two daily servings.   The type of carbohydrate in the diet is more important than the amount. Some sources of carbohydrates, such as vegetables, fruits, whole grains, and beans-are healthier than others.   Finally, stay active  Signed, Berniece Salines, DO  01/09/2019 12:20 PM    [facility]

## 2019-01-09 NOTE — Telephone Encounter (Signed)
Telephone call to patient. Spoke with Suanne Marker his wife. Explained Dr Harriet Masson wants an echo on patient after they left the office. Informed her it is scheduled for 03/07/19 at 2:15 PM. Suanne Marker verbalized understanding.

## 2019-01-11 ENCOUNTER — Ambulatory Visit (INDEPENDENT_AMBULATORY_CARE_PROVIDER_SITE_OTHER): Payer: Self-pay | Admitting: Surgery

## 2019-01-11 ENCOUNTER — Ambulatory Visit
Admission: RE | Admit: 2019-01-11 | Discharge: 2019-01-11 | Disposition: A | Discharge status: Home / Self Care | Payer: 59 | Source: Ambulatory Visit | Attending: Surgery | Admitting: Surgery

## 2019-01-11 ENCOUNTER — Encounter: Payer: Self-pay | Admitting: Surgery

## 2019-01-11 ENCOUNTER — Other Ambulatory Visit: Payer: Self-pay

## 2019-01-11 VITALS — BP 122/79 | HR 72 | Temp 97.5°F | Resp 20 | Ht 69.0 in | Wt 187.0 lb

## 2019-01-11 DIAGNOSIS — Z95828 Presence of other vascular implants and grafts: Secondary | ICD-10-CM

## 2019-01-11 DIAGNOSIS — Z952 Presence of prosthetic heart valve: Secondary | ICD-10-CM

## 2019-01-11 NOTE — Progress Notes (Signed)
HPI: Patient returns for routine postoperative follow-up having undergone replacement of the supra coronary ascending aorta using a 28 mm Hemashield graft and aortic valve replacement using a 23 mm Edwards INSPIRIS RESILIA pericardial valve on 12/08/2018. The patient's early postoperative recovery while in the hospital was notable for development of postoperative atrial fibrillation that converted to sinus rhythm with amiodarone.  Since hospital discharge the patient reports that he developed a rapid heart rate and was evaluated by cardiology and found to be in sinus rhythm.  He went to start cardiac rehab on 01/02/2019 and was noted to be tachycardic.  A rhythm strip showed sinus rhythm.  He was seen by cardiology and started on Toprol XL 12.5 mg daily in addition to the amiodarone.  He returned to cardiac rehab and was noted to have an irregular rhythm and an electrocardiogram showed atrial fibrillation with a slow ventricular rate.  He returned to see cardiology but refused anticoagulation.  He was continued on the same medication but a ZIO patch was applied to get a better handle on his atrial fibrillation burden.  He says that he has not noted any irregular heart rhythm or tachycardia since then.  He feels well and has been walking daily without chest pain or shortness of breath.   Current Outpatient Medications  Medication Sig Dispense Refill  . amiodarone (PACERONE) 200 MG tablet Take 1 tablet (200 mg total) by mouth 2 (two) times daily. 180 tablet 1  . aspirin 325 MG EC tablet TAKE 1 TABLET BY MOUTH EVERY DAY 30 tablet 0  . atorvastatin (LIPITOR) 20 MG tablet Take 1 tablet (20 mg total) by mouth at bedtime. 90 tablet 1  . Coenzyme Q10 (COQ10) 100 MG CAPS Take 100 mg by mouth at bedtime.    Marland Kitchen loratadine (CLARITIN) 10 MG tablet Take 10 mg by mouth daily.     . metoprolol succinate (TOPROL XL) 25 MG 24 hr tablet Take 1 tablet (25 mg total) by mouth daily. 90 tablet 1  . naproxen sodium  (ALEVE) 220 MG tablet Take 440 mg by mouth 2 (two) times daily as needed (pain.).    Marland Kitchen nitroGLYCERIN (NITROSTAT) 0.4 MG SL tablet Place 0.4 mg under the tongue every 5 (five) minutes x 3 doses as needed for chest pain.     Marland Kitchen omeprazole (PRILOSEC) 20 MG capsule Take 20 mg by mouth daily.     No current facility-administered medications for this visit.     Physical Exam: BP 122/79 (BP Location: Left Arm, Patient Position: Sitting, Cuff Size: Normal)   Pulse 72   Temp (!) 97.5 F (36.4 C)   Resp 20   Ht 5\' 9"  (1.753 m)   Wt 187 lb (84.8 kg)   SpO2 98% Comment: RA  BMI 27.62 kg/m  He looks well. Cardiac exam shows regular rate and rhythm with normal heart sounds.  There is no murmur. Lungs are clear. The chest incision is healing well and the sternum is stable. There is no peripheral edema.  Diagnostic Tests:  CLINICAL DATA:  58 year old male history of ascending aorta replacement. Follow-up study.  EXAM: CHEST - 2 VIEW  COMPARISON:  Chest x-ray 12/12/2018.  FINDINGS: Lung volumes are normal. No consolidative airspace disease. No pleural effusions. No pneumothorax. No pulmonary nodule or mass noted. Pulmonary vasculature and the cardiomediastinal silhouette are within normal limits. Status post median sternotomy for aortic valve replacement. New electronic projecting over the upper left hemithorax.  IMPRESSION: 1.  No  radiographic evidence of acute cardiopulmonary disease. 2. Postoperative changes and support apparatus, as above.   Electronically Signed   By: Vinnie Langton M.D.   On: 01/11/2019 10:58   Impression:  Overall I think the patient is doing very well following his surgery.  He developed postoperative atrial fibrillation which has been controlled with amiodarone and Toprol.  He has never had atrial fibrillation in the past and I suspect this is just the usual postoperative atrial fibrillation that will not be a long-term problem for him.  The  stimulus for this heart rhythm is usually gone by 6 weeks postoperatively and then he can be taken off amiodarone.  I think there is a small chance that he could have recurrent atrial fibrillation after that but it would be unusual in the postop setting with no prior history of atrial fibrillation.  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  Plan:  He will continue to follow-up with cardiology.  I will plan to see him back in 1 year with a CTA of the chest to reassess the remainder of his aorta for stability.   Gaye Pollack, MD [facility] [phone]

## 2019-02-03 ENCOUNTER — Other Ambulatory Visit: Payer: Self-pay

## 2019-02-03 ENCOUNTER — Ambulatory Visit (INDEPENDENT_AMBULATORY_CARE_PROVIDER_SITE_OTHER): Payer: Managed Care, Other (non HMO) | Admitting: Cardiology

## 2019-02-03 ENCOUNTER — Encounter: Payer: Self-pay | Admitting: Cardiology

## 2019-02-03 VITALS — BP 114/78 | HR 67 | Ht 69.0 in | Wt 189.8 lb

## 2019-02-03 DIAGNOSIS — I480 Paroxysmal atrial fibrillation: Secondary | ICD-10-CM

## 2019-02-03 DIAGNOSIS — Z952 Presence of prosthetic heart valve: Secondary | ICD-10-CM

## 2019-02-03 MED ORDER — AMIODARONE HCL 200 MG PO TABS: 200.0000 mg | ORAL_TABLET | Freq: Every day | ORAL | 1 refills | Status: DC

## 2019-02-03 NOTE — Progress Notes (Signed)
Cardiology Office Note:    Date:  02/03/2019   ID:  Cody Hebert, DOB 1960-12-31, MRN [id number]  PCP:  System, Pcp Not In  Cardiologist:  Thomasene Ripple, DO  Electrophysiologist:  None   Referring MD: No ref. 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 It was noted that he tolerated theprocedure without difficulty and was taken to the SICU in stable condition. He was extubated the evening of surgery. 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 The amiodarone was converted to the oral formand he was discharged on amiodarone.   Since his discharge he has been in and out of atrial fibrillation. He is still on amiodarone and I added Toprol xl on 01/02/2019 and titrated this up during his last clinic visit. I last saw the patient on 01/09/2019 at that time I placed a zio monitor on the patient. We discussed the possibility of anticoagulation especially if he continues to be in afib.   Today he reports intermittent palpitations but no other complaints.    Past Medical History:  Diagnosis Date  . Aortic stenosis   . Ascending aorta dilatation (HCC)   . GERD (gastroesophageal reflux disease)   . Hyperlipidemia   .  Pneumonia    hx in the past    Past Surgical History:  Procedure Laterality Date  . ANTERIOR CRUCIATE LIGAMENT REPAIR    . AORTIC VALVE REPLACEMENT N/A 12/08/2018   Procedure: AORTIC VALVE REPLACEMENT (AVR);  Surgeon: Alleen Borne, MD;  Location: [facility] OR;  Service: Open Heart Surgery;  Laterality: N/A;  . EYE SURGERY    . REPLACEMENT ASCENDING AORTA N/A 12/08/2018   Procedure: Replacement Ascending Aorta SUPRACORONARY;  Surgeon: Alleen Borne, MD;  Location: [facility] OR;  Service: Open Heart Surgery;  Laterality: N/A;  . RIGHT/LEFT HEART CATH AND CORONARY ANGIOGRAPHY N/A 11/22/2018   Procedure: RIGHT/LEFT HEART CATH AND CORONARY ANGIOGRAPHY;  Surgeon: Corky Crafts, MD;  Location: [facility] INVASIVE CV LAB;  Service: Cardiovascular;  Laterality: N/A;  . TEE WITHOUT CARDIOVERSION N/A 12/08/2018   Procedure: TRANSESOPHAGEAL ECHOCARDIOGRAM (TEE);  Surgeon: Alleen Borne, MD;  Location: [facility] OR;  Service: Open Heart Surgery;  Laterality: N/A;  . VASECTOMY      Current Medications: Current Meds  Medication Sig  . amiodarone (PACERONE) 200 MG tablet Take 1 tablet (200 mg total) by mouth daily.  Marland Kitchen aspirin 325 MG EC tablet TAKE 1 TABLET BY MOUTH EVERY DAY  . atorvastatin (LIPITOR) 20 MG tablet Take 1 tablet (20 mg total) by mouth at bedtime.  . Coenzyme Q10 (COQ10) 100 MG CAPS Take 100 mg by mouth at bedtime.  Marland Kitchen loratadine (CLARITIN) 10 MG tablet Take 10 mg by mouth daily.   . metoprolol succinate (TOPROL XL) 25 MG 24  hr tablet Take 1 tablet (25 mg total) by mouth daily.  . naproxen sodium (ALEVE) 220 MG tablet Take 440 mg by mouth 2 (two) times daily as needed (pain.).  Marland Kitchen nitroGLYCERIN (NITROSTAT) 0.4 MG SL tablet Place 0.4 mg under the tongue every 5 (five) minutes x 3 doses as needed for chest pain.   Marland Kitchen omeprazole (PRILOSEC) 20 MG capsule Take 20 mg by mouth daily.  . [DISCONTINUED] amiodarone (PACERONE) 200 MG tablet Take 1 tablet (200 mg total) by mouth 2 (two) times daily.     Allergies:    Doxycycline   Social History   Socioeconomic History  . Marital status: Married    Spouse name: Not on file  . Number of children: Not on file  . Years of education: Not on file  . Highest education level: Not on file  Occupational History  . Not on file  Tobacco Use  . Smoking status: Current Every Day Smoker    Packs/day: 1.50    Years: 40.00    Pack years: 60.00    Types: Cigarettes  . Smokeless tobacco: Never Used  Substance and Sexual Activity  . Alcohol use: Yes    Alcohol/week: 14.0 standard drinks    Types: 14 Cans of beer per week  . Drug use: Never  . Sexual activity: Not on file  Other Topics Concern  . Not on file  Social History Narrative  . Not on file   Social Determinants of Health   Financial Resource Strain:   . Difficulty of Paying Living Expenses: Not on file  Food Insecurity:   . Worried About Programme researcher, broadcasting/film/video in the Last Year: Not on file  . Ran Out of Food in the Last Year: Not on file  Transportation Needs:   . Lack of Transportation (Medical): Not on file  . Lack of Transportation (Non-Medical): Not on file  Physical Activity:   . Days of Exercise per Week: Not on file  . Minutes of Exercise per Session: Not on file  Stress:   . Feeling of Stress : Not on file  Social Connections:   . Frequency of Communication with Friends and Family: Not on file  . Frequency of Social Gatherings with Friends and Family: Not on file  . Attends Religious Services: Not on file  . Active Member of Clubs or Organizations: Not on file  . Attends Banker Meetings: Not on file  . Marital Status: Not on file     Family History: The patient's family history includes Cancer in his father; Congestive Heart Failure in his maternal grandmother; Diabetes in his brother.  ROS:   Review of Systems  Constitution: Negative for decreased appetite, fever and weight gain.  HENT: Negative for congestion, ear discharge, hoarse voice and sore throat.    Eyes: Negative for discharge, redness, vision loss in right eye and visual halos.  Cardiovascular: Negative for chest pain, dyspnea on exertion, leg swelling, orthopnea and palpitations.  Respiratory: Negative for cough, hemoptysis, shortness of breath and snoring.   Endocrine: Negative for heat intolerance and polyphagia.  Hematologic/Lymphatic: Negative for bleeding problem. Does not bruise/bleed easily.  Skin: Negative for flushing, nail changes, rash and suspicious lesions.  Musculoskeletal: Negative for arthritis, joint pain, muscle cramps, myalgias, neck pain and stiffness.  Gastrointestinal: Negative for abdominal pain, bowel incontinence, diarrhea and excessive appetite.  Genitourinary: Negative for decreased libido, genital sores and incomplete emptying.  Neurological: Negative for brief paralysis, focal weakness, headaches and loss of  balance.  Psychiatric/Behavioral: Negative for altered mental status, depression and suicidal ideas.  Allergic/Immunologic: Negative for HIV exposure and persistent infections.    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  Recent Labs: 12/05/2018: ALT 31 12/09/2018: Magnesium 2.3 12/12/2018: Hemoglobin 10.0; Platelets 202 12/13/2018: BUN 14; Creatinine, Ser 0.88; Potassium 3.7; Sodium 133  Recent Lipid Panel    Component Value Date/Time   CHOL 213 (H) 11/07/2018 0901   TRIG 111 11/07/2018 0901   HDL 45 11/07/2018 0901   CHOLHDL 4.7 11/07/2018 0901   LDLCALC 148 (H) 11/07/2018 0901    Physical Exam:    VS:  BP 114/78   Pulse 67   Ht 5\' 9"  (1.753 m)   Wt 189 lb 12.8 oz (86.1 kg)   BMI 28.03 kg/m     Wt Readings from Last 3 Encounters:  02/03/19 189 lb 12.8 oz (86.1 kg)  01/11/19 187 lb (84.8 kg)  01/09/19 187 lb (84.8 kg)     GEN: Well nourished, well  developed in no acute distress HEENT: Normal NECK: No JVD; No carotid bruits LYMPHATICS: No lymphadenopathy CARDIAC: S1S2 noted,RRR, no murmurs, rubs, gallops RESPIRATORY:  Clear to auscultation without rales, wheezing or rhonchi  ABDOMEN: Soft, non-tender, non-distended, +bowel sounds, no guarding. EXTREMITIES: No edema, No cyanosis, no clubbing MUSCULOSKELETAL:  No edema; No deformity  SKIN: Warm and dry NEUROLOGIC:  Alert and oriented x 3, non-focal PSYCHIATRIC:  Normal affect, good insight  ASSESSMENT:    1. Post operative atrial fibrillation   2. S/P AVR (aortic valve replacement) and aortoplasty    PLAN:     He is in sinus rhythm today, my goal is to eventually treat him off of amiodarone for this.  Atrial fibrillation.  I explained to the patient that times is difficult as patient's will postop A. fib to continue on having persistent or paroxysmal atrial fibrillation.  We will still get a hold off on starting anticoagulation for now.  His ZIO monitor is pending he has noted but we have not received this yet.  For now he will continue on for XL 25 mg daily, aspirin 325, atorvastatin and amiodarone 200 mg daily.  His echocardiogram is scheduled for January 19.     The patient is in agreement with the above plan. The patient left the office in stable condition.  The patient will follow up in 1 month.   Medication Adjustments/Labs and Tests Ordered: Current medicines are reviewed at length with the patient today.  Concerns regarding medicines are outlined above.  Orders Placed This Encounter  Procedures  . EKG 12-Lead   Meds ordered this encounter  Medications  . amiodarone (PACERONE) 200 MG tablet    Sig: Take 1 tablet (200 mg total) by mouth daily.    Dispense:  180 tablet    Refill:  1    Patient Instructions  Medication Instructions:  Your physician has recommended you make the following change in your medication:   DECREASE: Amiodarone  200 mg Take 1 tab daily   *If you need a refill on your cardiac medications before your next appointment, please call your pharmacy*  Lab Work: None If you have labs (blood work) drawn today and your tests are completely normal, you will receive your results only by: Marland Kitchen MyChart Message (if you have MyChart) OR . A paper copy  in the mail If you have any lab test that is abnormal or we need to change your treatment, we will call you to review the results.  Testing/Procedures: None  Follow-Up: At [facility], you and your health needs are our priority.  As part of our continuing mission to provide you with exceptional heart care, we have created designated Provider Care Teams.  These Care Teams include your primary Cardiologist (physician) and Advanced Practice Providers (APPs -  Physician Assistants and Nurse Practitioners) who all work together to provide you with the care you need, when you need it.  Your next appointment:   1 month(s)  The format for your next appointment:   In Person  Provider:   Thomasene RippleKardie Neelie Welshans, DO  Other Instructions      Adopting a Healthy Lifestyle.  Know what a healthy weight is for you (roughly BMI <25) and aim to maintain this   Aim for 7+ servings of fruits and vegetables daily   65-80+ fluid ounces of water or unsweet tea for healthy kidneys   Limit to max 1 drink of alcohol per day; avoid smoking/tobacco   Limit animal fats in diet for cholesterol and heart health - choose grass fed whenever available   Avoid highly processed foods, and foods high in saturated/trans fats   Aim for low stress - take time to unwind and care for your mental health   Aim for 150 min of moderate intensity exercise weekly for heart health, and weights twice weekly for bone health   Aim for 7-9 hours of sleep daily   When it comes to diets, agreement about the perfect plan isnt easy to find, even among the experts. Experts at the [employer] developed an idea known  as the Healthy Eating Plate. Just imagine a plate divided into logical, healthy portions.   The emphasis is on diet quality:   Load up on vegetables and fruits - one-half of your plate: Aim for color and variety, and remember that potatoes dont count.   Go for whole grains - one-quarter of your plate: Whole wheat, barley, wheat berries, quinoa, oats, brown rice, and foods made with them. If you want pasta, go with whole wheat pasta.   Protein power - one-quarter of your plate: Fish, chicken, beans, and nuts are all healthy, versatile protein sources. Limit red meat.   The diet, however, does go beyond the plate, offering a few other suggestions.   Use healthy plant oils, such as olive, canola, soy, corn, sunflower and peanut. Check the labels, and avoid partially hydrogenated oil, which have unhealthy trans fats.   If youre thirsty, drink water. Coffee and tea are good in moderation, but skip sugary drinks and limit milk and dairy products to one or two daily servings.   The type of carbohydrate in the diet is more important than the amount. Some sources of carbohydrates, such as vegetables, fruits, whole grains, and beans-are healthier than others.   Finally, stay active  Signed, Thomasene RippleKardie Tameko Halder, DO  02/03/2019 8:26 PM    [facility]

## 2019-02-03 NOTE — Patient Instructions (Signed)
Medication Instructions:  Your physician has recommended you make the following change in your medication:   DECREASE: Amiodarone  200 mg Take 1 tab daily  *If you need a refill on your cardiac medications before your next appointment, please call your pharmacy*  Lab Work: None If you have labs (blood work) drawn today and your tests are completely normal, you will receive your results only by: Marland Kitchen MyChart Message (if you have MyChart) OR . A paper copy in the mail If you have any lab test that is abnormal or we need to change your treatment, we will call you to review the results.  Testing/Procedures: None  Follow-Up: At [facility], you and your health needs are our priority.  As part of our continuing mission to provide you with exceptional heart care, we have created designated Provider Care Teams.  These Care Teams include your primary Cardiologist (physician) and Advanced Practice Providers (APPs -  Physician Assistants and Nurse Practitioners) who all work together to provide you with the care you need, when you need it.  Your next appointment:   1 month(s)  The format for your next appointment:   In Person  Provider:   Berniece Salines, DO  Other Instructions

## 2019-02-04 ENCOUNTER — Other Ambulatory Visit: Payer: Self-pay | Admitting: Cardiothoracic Surgery

## 2019-02-14 ENCOUNTER — Other Ambulatory Visit: Payer: Self-pay | Admitting: Cardiology

## 2019-02-16 ENCOUNTER — Telehealth: Payer: Self-pay | Admitting: Cardiology

## 2019-02-16 NOTE — Telephone Encounter (Signed)
Called patient. Informed him metoprolol succinate 25 mg daily was sent in with a 90 day supply at the end of November with 1 refill. He will check with pharmacy and let us know if he needs anything further.

## 2019-02-16 NOTE — Telephone Encounter (Signed)
*  STAT* If patient is at the pharmacy, call can be transferred to refill team.   1. Which medications need to be refilled? (please list name of each medication and dose if known) metoprolol succinate (TOPROL XL) 25 MG 24 hr tablet  2. Which pharmacy/location (including street and city if local pharmacy) is medication to be sent to? [id number] - [place], [place] - [address]  3. Do they need a 30 day or 90 day supply? 90 day

## 2019-03-07 ENCOUNTER — Other Ambulatory Visit: Payer: Self-pay

## 2019-03-07 ENCOUNTER — Encounter: Payer: Self-pay | Admitting: Cardiology

## 2019-03-07 ENCOUNTER — Ambulatory Visit (INDEPENDENT_AMBULATORY_CARE_PROVIDER_SITE_OTHER): Payer: 59 | Admitting: Cardiology

## 2019-03-07 ENCOUNTER — Ambulatory Visit (INDEPENDENT_AMBULATORY_CARE_PROVIDER_SITE_OTHER): Payer: 59

## 2019-03-07 ENCOUNTER — Encounter: Payer: Self-pay | Admitting: *Deleted

## 2019-03-07 VITALS — BP 130/82 | HR 65 | Ht 69.0 in | Wt 194.0 lb

## 2019-03-07 DIAGNOSIS — R072 Precordial pain: Secondary | ICD-10-CM

## 2019-03-07 DIAGNOSIS — E782 Mixed hyperlipidemia: Secondary | ICD-10-CM

## 2019-03-07 DIAGNOSIS — Z0289 Encounter for other administrative examinations: Secondary | ICD-10-CM

## 2019-03-07 DIAGNOSIS — I480 Paroxysmal atrial fibrillation: Secondary | ICD-10-CM

## 2019-03-07 DIAGNOSIS — I10 Essential (primary) hypertension: Secondary | ICD-10-CM

## 2019-03-07 DIAGNOSIS — I350 Nonrheumatic aortic (valve) stenosis: Secondary | ICD-10-CM

## 2019-03-07 DIAGNOSIS — Z952 Presence of prosthetic heart valve: Secondary | ICD-10-CM

## 2019-03-07 DIAGNOSIS — Q231 Congenital insufficiency of aortic valve: Secondary | ICD-10-CM

## 2019-03-07 MED ORDER — ASPIRIN EC 81 MG PO TBEC: 81.0000 mg | DELAYED_RELEASE_TABLET | Freq: Every day | ORAL | 3 refills | Status: AC

## 2019-03-07 NOTE — Progress Notes (Signed)
Cardiology Office Note:    Date:  03/07/2019   ID:  Cody Hebert, DOB Jul 25, 1960, MRN [id number]  PCP:  System, Pcp Not In  Cardiologist:  Thomasene Ripple, DO  Electrophysiologist:  None   Referring MD: No ref. 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 It was noted that he tolerated theprocedure without difficulty and was taken to the SICU in stable condition. He was extubated the evening of surgery. 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 The amiodarone was converted to the oral formand he was discharged on amiodarone.   Since his discharge he has been in and out of atrial fibrillation. He is still on amiodarone and I added Toprol xl on 01/02/2019 and titrated this up during his last clinic visit. I last saw the patient on 01/09/2019 at that time I placed a zio monitor on the patient. We discussed the possibility of anticoagulation especially if he continues to be in afib.  I placed a ZIO monitor the patient on January 09, 2019 which was 8 weeks post his surgery.  The goal was to discuss to see how much atrial fibrillation burden he had while on amiodarone and plans moving forward.   He was seen on February 03, 2019 at which time he was in sinus rhythm.  I kept the patient on amiodarone 100 mg daily along with his Toprol-XL 25 mg daily.  He was also maintained on aspirin 325 mg.  He is here today for follow-up visit.  He denies any symptoms of chest pain, shortness of breath, dizziness, nausea, vomiting.  He is inquiring about going back to work.   Past Medical History:  Diagnosis Date  . Aortic stenosis   . Ascending aorta dilatation (HCC)   . GERD (gastroesophageal reflux disease)   . Hyperlipidemia   . Pneumonia    hx in the past    Past Surgical History:  Procedure Laterality Date  . ANTERIOR CRUCIATE LIGAMENT REPAIR    . AORTIC VALVE REPLACEMENT N/A 12/08/2018   Procedure: AORTIC VALVE REPLACEMENT (AVR);  Surgeon: Alleen Borne, MD;  Location: [facility] OR;  Service: Open Heart Surgery;  Laterality: N/A;  . EYE SURGERY    . REPLACEMENT ASCENDING AORTA N/A 12/08/2018   Procedure: Replacement Ascending Aorta SUPRACORONARY;  Surgeon: Alleen Borne, MD;  Location: [facility] OR;  Service: Open Heart Surgery;  Laterality: N/A;  . RIGHT/LEFT HEART CATH AND CORONARY ANGIOGRAPHY N/A 11/22/2018   Procedure: RIGHT/LEFT HEART CATH AND CORONARY ANGIOGRAPHY;  Surgeon: Corky Crafts, MD;  Location: [facility] INVASIVE CV LAB;  Service: Cardiovascular;  Laterality: N/A;  . TEE WITHOUT CARDIOVERSION N/A 12/08/2018   Procedure: TRANSESOPHAGEAL ECHOCARDIOGRAM (TEE);  Surgeon: Alleen Borne, MD;  Location: [facility] OR;  Service: Open  Heart Surgery;  Laterality: N/A;  . VASECTOMY      Current Medications: Current Meds  Medication Sig  . amiodarone (PACERONE) 200 MG tablet Take 1 tablet (200 mg total) by mouth daily.  Marland Kitchen. atorvastatin (LIPITOR) 20 MG tablet Take 1 tablet (20 mg total) by mouth at bedtime.  . Coenzyme Q10 (COQ10) 100 MG CAPS Take 100 mg by mouth at bedtime.  Marland Kitchen. loratadine (CLARITIN) 10 MG tablet Take 10 mg by mouth daily.   . metoprolol succinate (TOPROL XL) 25 MG 24 hr  tablet Take 1 tablet (25 mg total) by mouth daily.  . naproxen sodium (ALEVE) 220 MG tablet Take 440 mg by mouth 2 (two) times daily as needed (pain.).  Marland Kitchen. nitroGLYCERIN (NITROSTAT) 0.4 MG SL tablet PLACE 1 TABLET (0.4 MG TOTAL) UNDER THE TONGUE EVERY 5 (FIVE) MINUTES AS NEEDED FOR CHEST PAIN.  Marland Kitchen. omeprazole (PRILOSEC) 20 MG capsule Take 20 mg by mouth daily.  . [DISCONTINUED] aspirin 325 MG EC tablet TAKE 1 TABLET BY MOUTH EVERY DAY     Allergies:   Doxycycline   Social History   Socioeconomic History  . Marital status: Married    Spouse name: Not on file  . Number of children: Not on file  . Years of education: Not on file  . Highest education level: Not on file  Occupational History  . Not on file  Tobacco Use  . Smoking status: Current Every Day Smoker    Packs/day: 1.50    Years: 40.00    Pack years: 60.00    Types: Cigarettes  . Smokeless tobacco: Never Used  Substance and Sexual Activity  . Alcohol use: Yes    Alcohol/week: 14.0 standard drinks    Types: 14 Cans of beer per week  . Drug use: Never  . Sexual activity: Not on file  Other Topics Concern  . Not on file  Social History Narrative  . Not on file   Social Determinants of Health   Financial Resource Strain:   . Difficulty of Paying Living Expenses: Not on file  Food Insecurity:   . Worried About Programme researcher, broadcasting/film/videounning Out of Food in the Last Year: Not on file  . Ran Out of Food in the Last Year: Not on file  Transportation Needs:   . Lack of Transportation (Medical): Not on file  . Lack of Transportation (Non-Medical): Not on file  Physical Activity:   . Days of Exercise per Week: Not on file  . Minutes of Exercise per Session: Not on file  Stress:   . Feeling of Stress : Not on file  Social Connections:   . Frequency of Communication with Friends and Family: Not on file  . Frequency of Social Gatherings with Friends and Family: Not on file  . Attends Religious Services: Not on file  . Active Member of Clubs or  Organizations: Not on file  . Attends BankerClub or Organization Meetings: Not on file  . Marital Status: Not on file     Family History: The patient's family history includes Cancer in his father; Congestive Heart Failure in his maternal grandmother; Diabetes in his brother.  ROS:   Review of Systems  Constitution: Negative for decreased appetite, fever and weight gain.  HENT: Negative for congestion, ear discharge, hoarse voice and sore throat.   Eyes: Negative for discharge, redness, vision loss in right eye and visual halos.  Cardiovascular: Negative for chest pain, dyspnea on exertion, leg swelling, orthopnea and palpitations.  Respiratory: Negative for cough,  hemoptysis, shortness of breath and snoring.   Endocrine: Negative for heat intolerance and polyphagia.  Hematologic/Lymphatic: Negative for bleeding problem. Does not bruise/bleed easily.  Skin: Negative for flushing, nail changes, rash and suspicious lesions.  Musculoskeletal: Negative for arthritis, joint pain, muscle cramps, myalgias, neck pain and stiffness.  Gastrointestinal: Negative for abdominal pain, bowel incontinence, diarrhea and excessive appetite.  Genitourinary: Negative for decreased libido, genital sores and incomplete emptying.  Neurological: Negative for brief paralysis, focal weakness, headaches and loss of balance.  Psychiatric/Behavioral: Negative for altered mental status, depression and suicidal ideas.  Allergic/Immunologic: Negative for HIV exposure and persistent infections.    EKGs/Labs/Other Studies Reviewed:    The following studies were reviewed today:   EKG:  The ekg ordered today demonstrates normal sinus rhythm, heart rate 65 bpm right  bundle branch block with left anterior fascicular block.  Zio Monitor  The patient wore the monitor for 14 days starting 01/09/2019. Indication: Post operative atrial fibrillation The minimum heart rate was 41 bpm, maximum heart rate was 172  bpm, and average  heart rate was 80 bpm. Predominant underlying rhythm was Sinus Rhythm.  Idioventricular Rhythm was present.  First Degree AV Block was present. Atrial Fibrillation occurred (6% burden), ranging from 66-172 bpm (avg of 106 bpm), the longest lasting 8 hours 14 mins with an avg rate of 103 bpm. Premature atrial complexes were rare (<1%).  Premature Ventricular complexes were rare (,1%). Ventricular Bigeminy and Trigeminy were present. No ventricular tachycardia, No pauses,  No second degree or third degree AV block present. No patient triggered events noted.   Conclusion: This study is remarkable for atrial fibrillation with rapid ventricular rate.   Recent Labs: 12/05/2018: ALT 31 12/09/2018: Magnesium 2.3 12/12/2018: Hemoglobin 10.0; Platelets 202 12/13/2018: BUN 14; Creatinine, Ser 0.88; Potassium 3.7; Sodium 133  Recent Lipid Panel    Component Value Date/Time   CHOL 213 (H) 11/07/2018 0901   TRIG 111 11/07/2018 0901   HDL 45 11/07/2018 0901   CHOLHDL 4.7 11/07/2018 0901   LDLCALC 148 (H) 11/07/2018 0901    Physical Exam:    VS:  BP 130/82   Pulse 65   Ht 5\' 9"  (1.753 m)   Wt 194 lb (88 kg)   BMI 28.65 kg/m     Wt Readings from Last 3 Encounters:  03/07/19 194 lb (88 kg)  02/03/19 189 lb 12.8 oz (86.1 kg)  01/11/19 187 lb (84.8 kg)     GEN: Well nourished, well developed in no acute distress HEENT: Normal NECK: No JVD; No carotid bruits LYMPHATICS: No lymphadenopathy CARDIAC: S1S2 noted,RRR, no murmurs, rubs, gallops RESPIRATORY:  Clear to auscultation without rales, wheezing or rhonchi  ABDOMEN: Soft, non-tender, non-distended, +bowel sounds, no guarding. EXTREMITIES: No edema, No cyanosis, no clubbing MUSCULOSKELETAL:  No edema; No deformity  SKIN: Warm and dry NEUROLOGIC:  Alert and oriented x 3, non-focal PSYCHIATRIC:  Normal affect, good insight  ASSESSMENT:    1. Bicuspid aortic valve   2. Severe aortic valve stenosis   3. Post operative atrial  fibrillation   4. Essential hypertension   5. Mixed hyperlipidemia   6. S/P AVR (aortic valve replacement) and aortoplasty   7. Precordial pain    PLAN:    1.  He has improved clinically since his surgery.  He is completing cardiac rehab and is happy with his recovery.  Clinically he may be ready to go back to work but his job does require a DOT physical with exercise treadmill stress  test which were going ordered today and the patient will schedule at his convenience.  2.  I am going to decrease his aspirin to 81 mg daily.  3.  In terms of his postop atrial fibrillation.  We discussed his ZIO monitor report which showed 6% of A. fib burden.  For now, keep him on the amiodarone 200 mg daily.  He will stay on this for the next 3 months at which time I will stop it and repeat his monitor says any more A. fib burden.  4.  He had an echocardiogram done today which I reviewed his bioprosthetic valve is well-seated and the echo is overall normal.  I shared this result with the patient today.  All of his questions were answered during his visit.   The patient is in agreement with the above plan. The patient left the office in stable condition.  The patient will follow up in 3 months or sooner if needed  Medication Adjustments/Labs and Tests Ordered: Current medicines are reviewed at length with the patient today.  Concerns regarding medicines are outlined above.  Orders Placed This Encounter  Procedures  . MYOCARDIAL PERFUSION IMAGING  . EKG 12-Lead   Meds ordered this encounter  Medications  . aspirin EC 81 MG tablet    Sig: Take 1 tablet (81 mg total) by mouth daily.    Dispense:  90 tablet    Refill:  3    Patient Instructions  Medication Instructions:  Your physician has recommended you make the following change in your medication:   DECREASE: Aspirin to 81 mg Take 1 tab daily  *If you need a refill on your cardiac medications before your next appointment, please call your  pharmacy*  Lab Work: None If you have labs (blood work) drawn today and your tests are completely normal, you will receive your results only by: Marland Kitchen MyChart Message (if you have MyChart) OR . A paper copy in the mail If you have any lab test that is abnormal or we need to change your treatment, we will call you to review the results.  Testing/Procedures: Your physician has requested that you have en exercise stress myoview. For further information please visit [email or web address] Please follow instruction sheet, as given.   Follow-Up: At [facility], you and your health needs are our priority.  As part of our continuing mission to provide you with exceptional heart care, we have created designated Provider Care Teams.  These Care Teams include your primary Cardiologist (physician) and Advanced Practice Providers (APPs -  Physician Assistants and Nurse Practitioners) who all work together to provide you with the care you need, when you need it.  Your next appointment:   3 month(s)  The format for your next appointment:   In Person  Provider:   Thomasene Ripple, DO  Other Instructions      Adopting a Healthy Lifestyle.  Know what a healthy weight is for you (roughly BMI <25) and aim to maintain this   Aim for 7+ servings of fruits and vegetables daily   65-80+ fluid ounces of water or unsweet tea for healthy kidneys   Limit to max 1 drink of alcohol per day; avoid smoking/tobacco   Limit animal fats in diet for cholesterol and heart health - choose grass fed whenever available   Avoid highly processed foods, and foods high in saturated/trans fats   Aim for low stress - take time to unwind and care for your mental health   Aim for  150 min of moderate intensity exercise weekly for heart health, and weights twice weekly for bone health   Aim for 7-9 hours of sleep daily   When it comes to diets, agreement about the perfect plan isnt easy to find, even among the experts.  Experts at the [employer] developed an idea known as the Healthy Eating Plate. Just imagine a plate divided into logical, healthy portions.   The emphasis is on diet quality:   Load up on vegetables and fruits - one-half of your plate: Aim for color and variety, and remember that potatoes dont count.   Go for whole grains - one-quarter of your plate: Whole wheat, barley, wheat berries, quinoa, oats, brown rice, and foods made with them. If you want pasta, go with whole wheat pasta.   Protein power - one-quarter of your plate: Fish, chicken, beans, and nuts are all healthy, versatile protein sources. Limit red meat.   The diet, however, does go beyond the plate, offering a few other suggestions.   Use healthy plant oils, such as olive, canola, soy, corn, sunflower and peanut. Check the labels, and avoid partially hydrogenated oil, which have unhealthy trans fats.   If youre thirsty, drink water. Coffee and tea are good in moderation, but skip sugary drinks and limit milk and dairy products to one or two daily servings.   The type of carbohydrate in the diet is more important than the amount. Some sources of carbohydrates, such as vegetables, fruits, whole grains, and beans-are healthier than others.   Finally, stay active  Signed, Berniece Salines, DO  03/07/2019 3:57 PM    [facility]

## 2019-03-07 NOTE — Progress Notes (Signed)
Complete echocardiogram has been performed.  Jimmy Mayelin Panos RDCS. RVT 

## 2019-03-07 NOTE — Patient Instructions (Signed)
Medication Instructions:  Your physician has recommended you make the following change in your medication:   DECREASE: Aspirin to 81 mg Take 1 tab daily  *If you need a refill on your cardiac medications before your next appointment, please call your pharmacy*  Lab Work: None If you have labs (blood work) drawn today and your tests are completely normal, you will receive your results only by: Marland Kitchen MyChart Message (if you have MyChart) OR . A paper copy in the mail If you have any lab test that is abnormal or we need to change your treatment, we will call you to review the results.  Testing/Procedures: Your physician has requested that you have en exercise stress myoview. For further information please visit [email or web address] Please follow instruction sheet, as given.   Follow-Up: At [facility], you and your health needs are our priority.  As part of our continuing mission to provide you with exceptional heart care, we have created designated Provider Care Teams.  These Care Teams include your primary Cardiologist (physician) and Advanced Practice Providers (APPs -  Physician Assistants and Nurse Practitioners) who all work together to provide you with the care you need, when you need it.  Your next appointment:   3 month(s)  The format for your next appointment:   In Person  Provider:   Thomasene Ripple, DO  Other Instructions

## 2019-03-08 ENCOUNTER — Encounter: Payer: Self-pay | Admitting: Cardiology

## 2019-03-08 ENCOUNTER — Telehealth: Payer: Self-pay | Admitting: *Deleted

## 2019-03-08 ENCOUNTER — Other Ambulatory Visit: Payer: Self-pay | Admitting: Cardiothoracic Surgery

## 2019-03-08 NOTE — Telephone Encounter (Signed)
Instructions and appointment information mailed to patient for ETT scheduled Thursday 03/16/19  945 am and COVID 19 testing scheduled Monday 1/25/212:55 pm at [address]

## 2019-03-13 ENCOUNTER — Inpatient Hospital Stay (HOSPITAL_COMMUNITY): Admission: RE | Admit: 2019-03-13 | Payer: 59 | Source: Ambulatory Visit

## 2019-03-13 ENCOUNTER — Telehealth: Payer: Self-pay | Admitting: Cardiology

## 2019-03-13 NOTE — Telephone Encounter (Signed)
Patient wants doctor to call him.

## 2019-03-14 ENCOUNTER — Encounter (HOSPITAL_COMMUNITY): Payer: 59

## 2019-03-14 ENCOUNTER — Telehealth (HOSPITAL_COMMUNITY): Payer: Self-pay

## 2019-03-14 NOTE — Telephone Encounter (Signed)
I call patient this morning [phone]), no response. I left a voice message.

## 2019-03-14 NOTE — Telephone Encounter (Signed)
Patient called back and spoke with Dr Servando Salina. Called him and told him his papers are ready.States will pick tomorrow. Faxed to [employer] also

## 2019-03-14 NOTE — Telephone Encounter (Signed)
Encounter complete. 

## 2019-03-15 ENCOUNTER — Telehealth (HOSPITAL_COMMUNITY): Payer: Self-pay | Admitting: *Deleted

## 2019-03-15 ENCOUNTER — Encounter: Payer: Self-pay | Admitting: Cardiology

## 2019-03-15 ENCOUNTER — Telehealth: Payer: Self-pay | Admitting: *Deleted

## 2019-03-15 NOTE — Telephone Encounter (Signed)
Close Encounter 

## 2019-03-15 NOTE — Telephone Encounter (Signed)
Spoke with patient @ 12:04pm regarding appointments for ETT and COVID testing----ETT----scheduled Thursday 03/23/19 at 3:30 pm at [facility] location---COVID testing scheduled Monday 03/20/19 @ 10:15 am---Patient was given instruction to self quarantine after COVID testing until his ETT is completed.  He was also given the address for the ETT ([facility] location) and the COVID testing ([address])  He voiced his understanding.  I will also mail ETT instructions to the patient

## 2019-03-16 ENCOUNTER — Ambulatory Visit (HOSPITAL_COMMUNITY): Admission: RE | Admit: 2019-03-16 | Payer: 59 | Source: Ambulatory Visit | Attending: Cardiology | Admitting: Cardiology

## 2019-03-20 ENCOUNTER — Other Ambulatory Visit (HOSPITAL_COMMUNITY)
Admission: RE | Admit: 2019-03-20 | Discharge: 2019-03-20 | Disposition: A | Discharge status: Home / Self Care | Payer: 59 | Source: Ambulatory Visit | Attending: Cardiovascular Disease | Admitting: Cardiovascular Disease

## 2019-03-20 DIAGNOSIS — Z01812 Encounter for preprocedural laboratory examination: Secondary | ICD-10-CM | POA: Insufficient documentation

## 2019-03-20 DIAGNOSIS — Z20822 Contact with and (suspected) exposure to covid-19: Secondary | ICD-10-CM | POA: Diagnosis not present

## 2019-03-20 LAB — SARS CORONAVIRUS 2 (TAT 6-24 HRS): SARS Coronavirus 2: NEGATIVE

## 2019-03-21 ENCOUNTER — Telehealth (HOSPITAL_COMMUNITY): Payer: Self-pay

## 2019-03-21 NOTE — Telephone Encounter (Signed)
Encounter complete. 

## 2019-03-23 ENCOUNTER — Telehealth: Payer: Self-pay | Admitting: Cardiology

## 2019-03-23 ENCOUNTER — Ambulatory Visit (HOSPITAL_COMMUNITY)
Admission: RE | Admit: 2019-03-23 | Discharge: 2019-03-23 | Disposition: A | Discharge status: Home / Self Care | Payer: 59 | Source: Ambulatory Visit | Attending: Cardiovascular Disease | Admitting: Cardiovascular Disease

## 2019-03-23 ENCOUNTER — Other Ambulatory Visit: Payer: Self-pay

## 2019-03-23 DIAGNOSIS — R072 Precordial pain: Secondary | ICD-10-CM | POA: Diagnosis present

## 2019-03-23 DIAGNOSIS — Z0289 Encounter for other administrative examinations: Secondary | ICD-10-CM | POA: Diagnosis present

## 2019-03-23 DIAGNOSIS — I480 Paroxysmal atrial fibrillation: Secondary | ICD-10-CM | POA: Insufficient documentation

## 2019-03-23 LAB — EXERCISE TOLERANCE TEST
Estimated workload: 10.1 METS
Exercise duration (min): 9 min
Exercise duration (sec): 0 s
MPHR: 164 {beats}/min
Peak HR: 146 {beats}/min
Percent HR: 89 %
RPE: 17
Rest HR: 77 {beats}/min

## 2019-03-23 NOTE — Telephone Encounter (Signed)
Telephone call to patient. Informed we have the paperwork and will be filling it out after his treadmill test is reviewed.

## 2019-03-23 NOTE — Telephone Encounter (Signed)
New message   Patient is calling to f/u on short term disability paperwork. Please call to discuss.

## 2019-03-24 ENCOUNTER — Telehealth: Payer: Self-pay | Admitting: Cardiology

## 2019-03-24 NOTE — Telephone Encounter (Signed)
Cody Hebert, with [employer] is calling to follow up in regards to whether the office has received a fax from [employer]. Cody Hebert states that a request for information from an appointment the patient completed with Dr. Servando Salina was faxed to the office. Please call to discuss.

## 2019-03-24 NOTE — Telephone Encounter (Signed)
Will route to primary nurse to advise if any paperwork was faxed over.

## 2019-03-24 NOTE — Telephone Encounter (Signed)
Spoke with representative at [employer]. They are asking if we git the paperwork for his disability. Patient had Exercise treadmill test yesterday and Dr Servando Salina has the paperwork to fill out.

## 2019-03-30 ENCOUNTER — Other Ambulatory Visit: Payer: Self-pay | Admitting: Cardiology

## 2019-06-04 ENCOUNTER — Other Ambulatory Visit: Payer: Self-pay | Admitting: Cardiology

## 2019-06-05 ENCOUNTER — Encounter: Payer: Self-pay | Admitting: Cardiology

## 2019-06-05 ENCOUNTER — Ambulatory Visit: Payer: 59 | Admitting: Cardiology

## 2019-06-05 ENCOUNTER — Other Ambulatory Visit: Payer: Self-pay

## 2019-06-05 VITALS — BP 130/70 | HR 71 | Ht 69.0 in | Wt 196.4 lb

## 2019-06-05 DIAGNOSIS — Z952 Presence of prosthetic heart valve: Secondary | ICD-10-CM

## 2019-06-05 DIAGNOSIS — Z95828 Presence of other vascular implants and grafts: Secondary | ICD-10-CM

## 2019-06-05 DIAGNOSIS — I480 Paroxysmal atrial fibrillation: Secondary | ICD-10-CM | POA: Diagnosis not present

## 2019-06-05 DIAGNOSIS — I10 Essential (primary) hypertension: Secondary | ICD-10-CM | POA: Diagnosis not present

## 2019-06-05 DIAGNOSIS — E782 Mixed hyperlipidemia: Secondary | ICD-10-CM

## 2019-06-05 DIAGNOSIS — Q231 Congenital insufficiency of aortic valve: Secondary | ICD-10-CM

## 2019-06-05 NOTE — Patient Instructions (Addendum)
Medication Instructions:  Stop Amiodarone   *If you need a refill on your cardiac medications before your next appointment, please call your pharmacy*   Lab Work: Bmet,cbc,mag- today  If you have labs (blood work) drawn today and your tests are completely normal, you will receive your results only by: Marland Kitchen MyChart Message (if you have MyChart) OR . A paper copy in the mail If you have any lab test that is abnormal or we need to change your treatment, we will call you to review the results.   Testing/Procedures: None ordered    Follow-Up: At [facility], you and your health needs are our priority.  As part of our continuing mission to provide you with exceptional heart care, we have created designated Provider Care Teams.  These Care Teams include your primary Cardiologist (physician) and Advanced Practice Providers (APPs -  Physician Assistants and Nurse Practitioners) who all work together to provide you with the care you need, when you need it.  We recommend signing up for the patient portal called "MyChart".  Sign up information is provided on this After Visit Summary.  MyChart is used to connect with patients for Virtual Visits (Telemedicine).  Patients are able to view lab/test results, encounter notes, upcoming appointments, etc.  Non-urgent messages can be sent to your provider as well.   To learn more about what you can do with MyChart, go to [email or web address].    Your next appointment:   6 month(s)  The format for your next appointment:   In Person  Provider:   Thomasene Ripple, DO   Other Instructions None

## 2019-06-05 NOTE — Progress Notes (Signed)
Cardiology Office Note:    Date:  06/05/2019   ID:  Cody Hebert, DOB 02-28-1960, MRN [id number]  PCP:  System, Pcp Not In  Cardiologist:  Thomasene Ripple, DO  Electrophysiologist:  None   Referring MD: No ref. provider found   The patient was here for a follow up visit   History of Present Illness:     Cody Hebert a 59 y.o.malewith a hx of of hyperlipidemia, history of bicuspid aortic valve, Severe aortic stenosisAVRwith a 23 mm Edwards INSPIRIS RESILIAand suparcoronary replacement of his ascending aorta with a 38mm Hemasheild graftdue to acute dissection in the operating roomon 12/08/2018.   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 It was noted that he tolerated theprocedure without difficulty and was taken to the SICU in stable condition. He was extubated the evening of surgery. 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 The amiodarone was converted to the oral formand he was discharged on amiodarone.   Since his discharge he has been in and out of atrial fibrillation. He is still on amiodarone and I added Toprol xl on 01/02/2019 and titrated this up during his last clinic visit. I last saw the patient on 01/09/2019 at that time I placed a zio monitor on the patient. We discussed the possibility of anticoagulation especially if he continues to be in afib.  I placed a ZIO monitor the patient on January 09, 2019 which was 8 weeks post his surgery.  The goal was to discuss to see how much atrial fibrillation burden he had while on amiodarone and plans moving forward.  He  was seen on February 03, 2019 at which time he was in sinus rhythm.  I kept the patient on amiodarone 100 mg daily along with his Toprol-XL 25 mg daily.  He was also maintained on aspirin 325 mg.  He was also seen on March 07, 2019 at that time I decrease his aspirin to 81 mg daily.  I reviewed the results of his echocardiogram with him which showed that his bioprosthetic valve was well-seated.  Today he is here for follow-up visit he appears to be doing well.  He tells me he is back to work.  He has full energy and has been doing a lot of activity without problems.  Denies any palpitations, chest pain, shortness of breath.  Past Medical History:  Diagnosis Date  . Aortic stenosis   . Ascending aorta dilatation (HCC)   . GERD (gastroesophageal reflux disease)   . Hyperlipidemia   . Pneumonia    hx in the past    Past Surgical History:  Procedure Laterality Date  . ANTERIOR CRUCIATE LIGAMENT REPAIR    . AORTIC VALVE REPLACEMENT N/A 12/08/2018   Procedure: AORTIC VALVE REPLACEMENT (AVR);  Surgeon: Alleen Borne, MD;  Location[facility];  Service: Open Heart Surgery;  Laterality: N/A;  . EYE SURGERY    . REPLACEMENT ASCENDING AORTA N/A 12/08/2018   Procedure: Replacement Ascending Aorta SUPRACORONARY;  Surgeon: Alleen Borne, MD;  Location: [facility] OR;  Service: Open Heart Surgery;  Laterality: N/A;  . RIGHT/LEFT HEART CATH AND CORONARY ANGIOGRAPHY N/A 11/22/2018  Procedure: RIGHT/LEFT HEART CATH AND CORONARY ANGIOGRAPHY;  Surgeon: Jettie Booze, MD;  Location: [facility];  Service: Cardiovascular;  Laterality: N/A;  . TEE WITHOUT CARDIOVERSION N/A 12/08/2018   Procedure: TRANSESOPHAGEAL ECHOCARDIOGRAM (TEE);  Surgeon: Gaye Pollack, MD;  Location: [facility];  Service: Open Heart Surgery;  Laterality: N/A;  . VASECTOMY      Current Medications: Current Meds  Medication Sig  . aspirin EC 81 MG tablet Take 1 tablet (81 mg total) by mouth daily.  Marland Kitchen atorvastatin (LIPITOR) 20  MG tablet Take 1 tablet (20 mg total) by mouth at bedtime.  . Coenzyme Q10 (COQ10) 100 MG CAPS Take 100 mg by mouth at bedtime.  Marland Kitchen loratadine (CLARITIN) 10 MG tablet Take 10 mg by mouth daily.   . metoprolol succinate (TOPROL XL) 25 MG 24 hr tablet Take 1 tablet (25 mg total) by mouth daily.  . naproxen sodium (ALEVE) 220 MG tablet Take 440 mg by mouth 2 (two) times daily as needed (pain.).  Marland Kitchen nitroGLYCERIN (NITROSTAT) 0.4 MG SL tablet PLACE 1 TABLET (0.4 MG TOTAL) UNDER THE TONGUE EVERY 5 (FIVE) MINUTES AS NEEDED FOR CHEST PAIN.  Marland Kitchen omeprazole (PRILOSEC) 20 MG capsule Take 20 mg by mouth daily.  . [DISCONTINUED] amiodarone (PACERONE) 200 MG tablet Take 1 tablet (200 mg total) by mouth daily.     Allergies:   Doxycycline   Social History   Socioeconomic History  . Marital status: Married    Spouse name: Not on file  . Number of children: Not on file  . Years of education: Not on file  . Highest education level: Not on file  Occupational History  . Not on file  Tobacco Use  . Smoking status: Current Every Day Smoker    Packs/day: 1.50    Years: 40.00    Pack years: 60.00    Types: Cigarettes  . Smokeless tobacco: Never Used  Substance and Sexual Activity  . Alcohol use: Yes    Alcohol/week: 14.0 standard drinks    Types: 14 Cans of beer per week  . Drug use: Never  . Sexual activity: Not on file  Other Topics Concern  . Not on file  Social History Narrative  . Not on file   Social Determinants of Health   Financial Resource Strain:   . Difficulty of Paying Living Expenses:   Food Insecurity:   . Worried About Charity fundraiser in the Last Year:   . Arboriculturist in the Last Year:   Transportation Needs:   . Film/video editor (Medical):   Marland Kitchen Lack of Transportation (Non-Medical):   Physical Activity:   . Days of Exercise per Week:   . Minutes of Exercise per Session:   Stress:   . Feeling of Stress :   Social Connections:   . Frequency of Communication with  Friends and Family:   . Frequency of Social Gatherings with Friends and Family:   . Attends Religious Services:   . Active Member of Clubs or Organizations:   . Attends Archivist Meetings:   Marland Kitchen Marital Status:      Family History: The patient's family history includes Cancer in his father; Congestive Heart Failure in his maternal grandmother; Diabetes in his brother.  ROS:   Review of Systems  Constitution: Negative for decreased appetite, fever and weight gain.  HENT: Negative for congestion, ear discharge, hoarse voice and sore throat.   Eyes: Negative for discharge, redness, vision loss  in right eye and visual halos.  Cardiovascular: Negative for chest pain, dyspnea on exertion, leg swelling, orthopnea and palpitations.  Respiratory: Negative for cough, hemoptysis, shortness of breath and snoring.   Endocrine: Negative for heat intolerance and polyphagia.  Hematologic/Lymphatic: Negative for bleeding problem. Does not bruise/bleed easily.  Skin: Negative for flushing, nail changes, rash and suspicious lesions.  Musculoskeletal: Negative for arthritis, joint pain, muscle cramps, myalgias, neck pain and stiffness.  Gastrointestinal: Negative for abdominal pain, bowel incontinence, diarrhea and excessive appetite.  Genitourinary: Negative for decreased libido, genital sores and incomplete emptying.  Neurological: Negative for brief paralysis, focal weakness, headaches and loss of balance.  Psychiatric/Behavioral: Negative for altered mental status, depression and suicidal ideas.  Allergic/Immunologic: Negative for HIV exposure and persistent infections.    EKGs/Labs/Other Studies Reviewed:    The following studies were reviewed today:   EKG:  The ekg ordered today demonstrates sinus rhythm, heart rate 67 bpm, left atrial enlargement with right bundle branch block and left anterior fascicular block.  Compared to prior EKG in December 2018 no significant change.  TTE  IMPRESSIONS March 07, 2019 1. Left ventricular ejection fraction, by visual estimation, is 60 to 65%. The left ventricle has normal function. There is moderately increased left ventricular hypertrophy.  2. Left ventricular diastolic parameters are consistent with Grade II diastolic dysfunction (pseudonormalization).  3. Global right ventricle has normal systolic function.The right ventricular size is normal. No increase in right ventricular wall thickness.  4. Left atrial size was normal.  5. Right atrial size was normal.  6. The mitral valve is normal in structure. No evidence of mitral valve regurgitation. No evidence of mitral stenosis.  7. The tricuspid valve is normal in structure. Tricuspid valve regurgitation is not demonstrated.  8. Well seated bioprosthetic 75mm Edwards valve. Aortic valve regurgitation is not visualized. No evidence of aortic valve sclerosis or stenosis.  9. The pulmonic valve was normal in structure. Pulmonic valve regurgitation is not visualized.  10. Unable to obtain Pulmonary Artery Systolic Pressure, No TR jet spectral display.   Recent Labs: 12/05/2018: ALT 31 12/09/2018: Magnesium 2.3 12/12/2018: Hemoglobin 10.0; Platelets 202 12/13/2018: BUN 14; Creatinine, Ser 0.88; Potassium 3.7; Sodium 133  Recent Lipid Panel    Component Value Date/Time   CHOL 213 (H) 11/07/2018 0901   TRIG 111 11/07/2018 0901   HDL 45 11/07/2018 0901   CHOLHDL 4.7 11/07/2018 0901   LDLCALC 148 (H) 11/07/2018 0901    Physical Exam:    VS:  BP 130/70   Pulse 71   Ht 5\' 9"  (1.753 m)   Wt 196 lb 6.4 oz (89.1 kg)   SpO2 97%   BMI 29.00 kg/m     Wt Readings from Last 3 Encounters:  06/05/19 196 lb 6.4 oz (89.1 kg)  03/07/19 194 lb (88 kg)  02/03/19 189 lb 12.8 oz (86.1 kg)     GEN: Well nourished, well developed in no acute distress HEENT: Normal NECK: No JVD; No carotid bruits LYMPHATICS: No lymphadenopathy CARDIAC: S1S2 noted,RRR, no murmurs, rubs,  gallops RESPIRATORY:  Clear to auscultation without rales, wheezing or rhonchi  ABDOMEN: Soft, non-tender, non-distended, +bowel sounds, no guarding. EXTREMITIES: No edema, No cyanosis, no clubbing MUSCULOSKELETAL:  No deformity  SKIN: Warm and dry NEUROLOGIC:  Alert and oriented x 3, non-focal PSYCHIATRIC:  Normal affect, good insight  ASSESSMENT:    1. Essential hypertension   2. Bicuspid aortic valve   3. Post operative atrial fibrillation   4. Mixed hyperlipidemia  5. S/P AVR (aortic valve replacement) and aortoplasty   6. S/P ascending aortic replacement    PLAN:     1.  Hypertension-his blood pressure deceptively in the office today.  No changes will be made  2.  Postop atrial fibrillation-he is in sinus rhythm today.  Going to stop his amiodarone and follow-up with the patient.  I do suspect that all his atrial fibrillation was postoperatively and at this time he may not benefit from amiodarone.  We will get another monitor eventually at his next visit to see if there is any recurrent atrial fibrillation.  3.  Status post valve replacement continue patient on his aspirin 81 mg daily.  4.  Hyperlipidemia continue patient on his statin medication.  5. Bmp, mag, CBC will be done today.  The patient is in agreement with the above plan. The patient left the office in stable condition.  The patient will follow up in 6 months or sooner if needed.   Medication Adjustments/Labs and Tests Ordered: Current medicines are reviewed at length with the patient today.  Concerns regarding medicines are outlined above.  Orders Placed This Encounter  Procedures  . Basic metabolic panel  . Magnesium  . CBC   No orders of the defined types were placed in this encounter.   Patient Instructions  Medication Instructions:  Stop Amiodarone   *If you need a refill on your cardiac medications before your next appointment, please call your pharmacy*   Lab Work: Bmet,cbc,mag-  today  If you have labs (blood work) drawn today and your tests are completely normal, you will receive your results only by: Marland Kitchen MyChart Message (if you have MyChart) OR . A paper copy in the mail If you have any lab test that is abnormal or we need to change your treatment, we will call you to review the results.   Testing/Procedures: None ordered    Follow-Up: At [facility], you and your health needs are our priority.  As part of our continuing mission to provide you with exceptional heart care, we have created designated Provider Care Teams.  These Care Teams include your primary Cardiologist (physician) and Advanced Practice Providers (APPs -  Physician Assistants and Nurse Practitioners) who all work together to provide you with the care you need, when you need it.  We recommend signing up for the patient portal called "MyChart".  Sign up information is provided on this After Visit Summary.  MyChart is used to connect with patients for Virtual Visits (Telemedicine).  Patients are able to view lab/test results, encounter notes, upcoming appointments, etc.  Non-urgent messages can be sent to your provider as well.   To learn more about what you can do with MyChart, go to [email or web address].    Your next appointment:   6 month(s)  The format for your next appointment:   In Person  Provider:   Thomasene Ripple, DO   Other Instructions None      Adopting a Healthy Lifestyle.  Know what a healthy weight is for you (roughly BMI <25) and aim to maintain this   Aim for 7+ servings of fruits and vegetables daily   65-80+ fluid ounces of water or unsweet tea for healthy kidneys   Limit to max 1 drink of alcohol per day; avoid smoking/tobacco   Limit animal fats in diet for cholesterol and heart health - choose grass fed whenever available   Avoid highly processed foods, and foods high in saturated/trans fats   Aim for  low stress - take time to unwind and care for your  mental health   Aim for 150 min of moderate intensity exercise weekly for heart health, and weights twice weekly for bone health   Aim for 7-9 hours of sleep daily   When it comes to diets, agreement about the perfect plan isnt easy to find, even among the experts. Experts at the [employer] developed an idea known as the Healthy Eating Plate. Just imagine a plate divided into logical, healthy portions.   The emphasis is on diet quality:   Load up on vegetables and fruits - one-half of your plate: Aim for color and variety, and remember that potatoes dont count.   Go for whole grains - one-quarter of your plate: Whole wheat, barley, wheat berries, quinoa, oats, brown rice, and foods made with them. If you want pasta, go with whole wheat pasta.   Protein power - one-quarter of your plate: Fish, chicken, beans, and nuts are all healthy, versatile protein sources. Limit red meat.   The diet, however, does go beyond the plate, offering a few other suggestions.   Use healthy plant oils, such as olive, canola, soy, corn, sunflower and peanut. Check the labels, and avoid partially hydrogenated oil, which have unhealthy trans fats.   If youre thirsty, drink water. Coffee and tea are good in moderation, but skip sugary drinks and limit milk and dairy products to one or two daily servings.   The type of carbohydrate in the diet is more important than the amount. Some sources of carbohydrates, such as vegetables, fruits, whole grains, and beans-are healthier than others.   Finally, stay active  Signed, Thomasene RippleKardie Salik Grewell, DO  06/05/2019 5:10 PM    [facility]

## 2019-06-06 ENCOUNTER — Telehealth: Payer: Self-pay

## 2019-06-06 LAB — CBC
Hematocrit: 42.2 % (ref 37.5–51.0)
Hemoglobin: 14 g/dL (ref 13.0–17.7)
MCH: 28.9 pg (ref 26.6–33.0)
MCHC: 33.2 g/dL (ref 31.5–35.7)
MCV: 87 fL (ref 79–97)
Platelets: 252 10*3/uL (ref 150–450)
RBC: 4.85 x10E6/uL (ref 4.14–5.80)
RDW: 16.7 % — ABNORMAL HIGH (ref 11.6–15.4)
WBC: 8.2 10*3/uL (ref 3.4–10.8)

## 2019-06-06 LAB — BASIC METABOLIC PANEL
BUN/Creatinine Ratio: 14 (ref 9–20)
BUN: 14 mg/dL (ref 6–24)
CO2: 23 mmol/L (ref 20–29)
Calcium: 9.6 mg/dL (ref 8.7–10.2)
Chloride: 103 mmol/L (ref 96–106)
Creatinine, Ser: 1 mg/dL (ref 0.76–1.27)
GFR calc Af Amer: 97 mL/min/{1.73_m2} (ref >59)
GFR calc non Af Amer: 84 mL/min/{1.73_m2} (ref >59)
Glucose: 89 mg/dL (ref 65–99)
Potassium: 4.2 mmol/L (ref 3.5–5.2)
Sodium: 141 mmol/L (ref 134–144)

## 2019-06-06 LAB — MAGNESIUM: Magnesium: 2 mg/dL (ref 1.6–2.3)

## 2019-06-06 NOTE — Telephone Encounter (Signed)
Spoke with patient regarding results.  Patient verbalizes understanding and is agreeable to plan of care. Advised patient to call back with any issues or concerns.  

## 2019-06-06 NOTE — Addendum Note (Signed)
Addended by: Virl Axe, Annaleigh Steinmeyer L on: 06/06/2019 09:39 AM   Modules accepted: Orders

## 2019-06-06 NOTE — Telephone Encounter (Signed)
-----   Message from Thomasene Ripple, DO sent at 06/06/2019  8:11 AM EDT ----- Normal labs

## 2019-08-30 ENCOUNTER — Other Ambulatory Visit: Payer: Self-pay | Admitting: Cardiology

## 2019-11-03 DIAGNOSIS — E785 Hyperlipidemia, unspecified: Secondary | ICD-10-CM | POA: Insufficient documentation

## 2019-11-03 DIAGNOSIS — K219 Gastro-esophageal reflux disease without esophagitis: Secondary | ICD-10-CM | POA: Insufficient documentation

## 2019-11-03 DIAGNOSIS — J189 Pneumonia, unspecified organism: Secondary | ICD-10-CM | POA: Insufficient documentation

## 2019-11-03 DIAGNOSIS — I77810 Thoracic aortic ectasia: Secondary | ICD-10-CM | POA: Insufficient documentation

## 2019-11-03 DIAGNOSIS — I350 Nonrheumatic aortic (valve) stenosis: Secondary | ICD-10-CM | POA: Insufficient documentation

## 2019-11-06 ENCOUNTER — Ambulatory Visit: Payer: 59 | Admitting: Cardiology

## 2019-11-06 ENCOUNTER — Other Ambulatory Visit: Payer: Self-pay

## 2019-11-06 ENCOUNTER — Encounter: Payer: Self-pay | Admitting: Cardiology

## 2019-11-06 VITALS — BP 110/80 | HR 70 | Ht 69.0 in | Wt 192.0 lb

## 2019-11-06 DIAGNOSIS — I77810 Thoracic aortic ectasia: Secondary | ICD-10-CM

## 2019-11-06 DIAGNOSIS — Q231 Congenital insufficiency of aortic valve: Secondary | ICD-10-CM | POA: Diagnosis not present

## 2019-11-06 DIAGNOSIS — I10 Essential (primary) hypertension: Secondary | ICD-10-CM | POA: Diagnosis not present

## 2019-11-06 DIAGNOSIS — E782 Mixed hyperlipidemia: Secondary | ICD-10-CM

## 2019-11-06 DIAGNOSIS — Z95828 Presence of other vascular implants and grafts: Secondary | ICD-10-CM

## 2019-11-06 DIAGNOSIS — I480 Paroxysmal atrial fibrillation: Secondary | ICD-10-CM

## 2019-11-06 DIAGNOSIS — Z952 Presence of prosthetic heart valve: Secondary | ICD-10-CM

## 2019-11-06 NOTE — Progress Notes (Signed)
Cardiology Office Note:    Date:  11/06/2019   ID:  Carr Shartzer, DOB 01-Aug-1960, MRN [id number]  PCP:  Pcp, No  Cardiologist:  Thomasene Ripple, DO  Electrophysiologist:  None   Referring MD: No ref. provider found   Chief Complaint  Patient presents with  . Follow-up    History of Present Illness:    Raynold Blankenbaker a 59 y.o.malewith a hx of of hyperlipidemia, history of bicuspid aortic valve, Severe aortic stenosisAVRwith a 23 mm Edwards INSPIRIS RESILIAand suparcoronary replacement of his ascending aorta with a 94mm Hemasheild graftdue to acute dissection in the operating roomon 12/08/2018 and postop atrial fibrillation now off amiodarone..   I did see the patient April 2021 at that time he was back to work and he was doing well from a cardiovascular standpoint.  Today he is here for follow-up visit he tells me that things have been going well. He offers no complaints at this time. He denies any chest pain or shortness of breath.   Past Medical History:  Diagnosis Date  . Aortic stenosis   . Ascending aorta dilatation (HCC)   . Bicuspid aortic valve 11/07/2018  . Chest pain of uncertain etiology 11/07/2018  . Essential hypertension 01/09/2019  . GERD (gastroesophageal reflux disease)   . Hyperlipidemia   . Mixed hyperlipidemia 11/07/2018  . Pneumonia    hx in the past  . Post operative atrial fibrillation 01/09/2019  . S/P ascending aortic replacement 12/09/2018  . S/P AVR (aortic valve replacement) and aortoplasty 12/08/2018  . Severe aortic valve stenosis     Past Surgical History:  Procedure Laterality Date  . ANTERIOR CRUCIATE LIGAMENT REPAIR    . AORTIC VALVE REPLACEMENT N/A 12/08/2018   Procedure: AORTIC VALVE REPLACEMENT (AVR);  Surgeon: Alleen Borne, MD;  Location: [facility] OR;  Service: Open Heart Surgery;  Laterality: N/A;  . EYE SURGERY    . REPLACEMENT ASCENDING AORTA N/A 12/08/2018   Procedure: Replacement Ascending Aorta SUPRACORONARY;  Surgeon:  Alleen Borne, MD;  Location: [facility] OR;  Service: Open Heart Surgery;  Laterality: N/A;  . RIGHT/LEFT HEART CATH AND CORONARY ANGIOGRAPHY N/A 11/22/2018   Procedure: RIGHT/LEFT HEART CATH AND CORONARY ANGIOGRAPHY;  Surgeon: Corky Crafts, MD;  Location: [facility] INVASIVE CV LAB;  Service: Cardiovascular;  Laterality: N/A;  . TEE WITHOUT CARDIOVERSION N/A 12/08/2018   Procedure: TRANSESOPHAGEAL ECHOCARDIOGRAM (TEE);  Surgeon: Alleen Borne, MD;  Location: [facility] OR;  Service: Open Heart Surgery;  Laterality: N/A;  . VASECTOMY      Current Medications: Current Meds  Medication Sig  . aspirin EC 81 MG tablet Take 1 tablet (81 mg total) by mouth daily.  Marland Kitchen atorvastatin (LIPITOR) 20 MG tablet TAKE 1 TABLET BY MOUTH EVERY DAY  . Coenzyme Q10 (COQ10) 100 MG CAPS Take 100 mg by mouth at bedtime.  Marland Kitchen loratadine (CLARITIN) 10 MG tablet Take 10 mg by mouth daily.   . Magnesium 250 MG TABS Take 250 mg by mouth in the morning and at bedtime.  . metoprolol succinate (TOPROL-XL) 25 MG 24 hr tablet TAKE 1 TABLET BY MOUTH EVERY DAY  . Milk Thistle 200 MG CAPS Take by mouth daily.  . Multiple Vitamin (MULTIVITAMIN) capsule Take 1 capsule by mouth daily.  . naproxen sodium (ALEVE) 220 MG tablet Take 440 mg by mouth 2 (two) times daily as needed (pain.).  Marland Kitchen nitroGLYCERIN (NITROSTAT) 0.4 MG SL tablet PLACE 1 TABLET (0.4 MG TOTAL) UNDER THE TONGUE EVERY 5 (FIVE) MINUTES AS NEEDED FOR  CHEST PAIN.  Marland Kitchen omeprazole (PRILOSEC) 20 MG capsule Take 20 mg by mouth daily.  Marland Kitchen OVER THE COUNTER MEDICATION daily. Beet Root     Allergies:   Doxycycline   Social History   Socioeconomic History  . Marital status: Married    Spouse name: Not on file  . Number of children: Not on file  . Years of education: Not on file  . Highest education level: Not on file  Occupational History  . Not on file  Tobacco Use  . Smoking status: Current Every Day Smoker    Packs/day: 1.50    Years: 40.00    Pack years: 60.00    Types:  Cigarettes  . Smokeless tobacco: Never Used  Vaping Use  . Vaping Use: Former  Substance and Sexual Activity  . Alcohol use: Yes    Alcohol/week: 14.0 standard drinks    Types: 14 Cans of beer per week  . Drug use: Never  . Sexual activity: Not on file  Other Topics Concern  . Not on file  Social History Narrative  . Not on file   Social Determinants of Health   Financial Resource Strain:   . Difficulty of Paying Living Expenses: Not on file  Food Insecurity:   . Worried About Programme researcher, broadcasting/film/video in the Last Year: Not on file  . Ran Out of Food in the Last Year: Not on file  Transportation Needs:   . Lack of Transportation (Medical): Not on file  . Lack of Transportation (Non-Medical): Not on file  Physical Activity:   . Days of Exercise per Week: Not on file  . Minutes of Exercise per Session: Not on file  Stress:   . Feeling of Stress : Not on file  Social Connections:   . Frequency of Communication with Friends and Family: Not on file  . Frequency of Social Gatherings with Friends and Family: Not on file  . Attends Religious Services: Not on file  . Active Member of Clubs or Organizations: Not on file  . Attends Banker Meetings: Not on file  . Marital Status: Not on file     Family History: The patient's family history includes Cancer in his father; Congestive Heart Failure in his maternal grandmother; Diabetes in his brother.  ROS:   Review of Systems  Constitution: Negative for decreased appetite, fever and weight gain.  HENT: Negative for congestion, ear discharge, hoarse voice and sore throat.   Eyes: Negative for discharge, redness, vision loss in right eye and visual halos.  Cardiovascular: Negative for chest pain, dyspnea on exertion, leg swelling, orthopnea and palpitations.  Respiratory: Negative for cough, hemoptysis, shortness of breath and snoring.   Endocrine: Negative for heat intolerance and polyphagia.  Hematologic/Lymphatic: Negative  for bleeding problem. Does not bruise/bleed easily.  Skin: Negative for flushing, nail changes, rash and suspicious lesions.  Musculoskeletal: Negative for arthritis, joint pain, muscle cramps, myalgias, neck pain and stiffness.  Gastrointestinal: Negative for abdominal pain, bowel incontinence, diarrhea and excessive appetite.  Genitourinary: Negative for decreased libido, genital sores and incomplete emptying.  Neurological: Negative for brief paralysis, focal weakness, headaches and loss of balance.  Psychiatric/Behavioral: Negative for altered mental status, depression and suicidal ideas.  Allergic/Immunologic: Negative for HIV exposure and persistent infections.    EKGs/Labs/Other Studies Reviewed:    The following studies were reviewed today:   EKG: None today  TTE IMPRESSIONS March 07, 2019 1. Left ventricular ejection fraction, by visual estimation, is 60 to 65%.  The left ventricle has normal function. There is moderately increased left ventricular hypertrophy.  2. Left ventricular diastolic parameters are consistent with Grade II diastolic dysfunction (pseudonormalization).  3. Global right ventricle has normal systolic function.The right ventricular size is normal. No increase in right ventricular wall thickness.  4. Left atrial size was normal.  5. Right atrial size was normal.  6. The mitral valve is normal in structure. No evidence of mitral valve regurgitation. No evidence of mitral stenosis.  7. The tricuspid valve is normal in structure. Tricuspid valve regurgitation is not demonstrated.  8. Well seated bioprosthetic 23mm Edwards valve. Aortic valve regurgitation is not visualized. No evidence of aortic valve sclerosis or stenosis.  9. The pulmonic valve was normal in structure. Pulmonic valve regurgitation is not visualized.  10. Unable to obtain Pulmonary Artery Systolic Pressure, No TR jet spectral display.   Recent Labs: 12/05/2018: ALT 31 06/05/2019: BUN  14; Creatinine, Ser 1.00; Hemoglobin 14.0; Magnesium 2.0; Platelets 252; Potassium 4.2; Sodium 141  Recent Lipid Panel    Component Value Date/Time   CHOL 213 (H) 11/07/2018 0901   TRIG 111 11/07/2018 0901   HDL 45 11/07/2018 0901   CHOLHDL 4.7 11/07/2018 0901   LDLCALC 148 (H) 11/07/2018 0901    Physical Exam:    VS:  BP 110/80 (BP Location: Right Arm, Patient Position: Sitting, Cuff Size: Normal)   Pulse 70   Ht  (1.753 m)   Wt 192 lb (87.1 kg)   SpO2 96%   BMI 28.35 kg/m     Wt Readings from Last 3 Encounters:  11/06/19 192 lb (87.1 kg)  06/05/19 196 lb 6.4 oz (89.1 kg)  03/07/19 194 lb (88 kg)     GEN: Well nourished, well developed in no acute distress HEENT: Normal NECK: No JVD; No carotid bruits LYMPHATICS: No lymphadenopathy CARDIAC: S1S2 noted,RRR, no murmurs, rubs, gallops RESPIRATORY:  Clear to auscultation without rales, wheezing or rhonchi  ABDOMEN: Soft, non-tender, non-distended, +bowel sounds, no guarding. EXTREMITIES: No edema, No cyanosis, no clubbing MUSCULOSKELETAL:  No deformity  SKIN: Warm and dry NEUROLOGIC:  Alert and oriented x 3, non-focal PSYCHIATRIC:  Normal affect, good insight  ASSESSMENT:    1. Bicuspid aortic valve   2. Post operative atrial fibrillation   3. Essential hypertension   4. Ascending aorta dilatation (HCC)   5. Mixed hyperlipidemia   6. S/P AVR (aortic valve replacement) and aortoplasty   7. S/P ascending aortic replacement    PLAN:      His blood pressure deceptively in the office no changes will be made on his antihypertensive regimen. He will remain on his aspirin as well as his statin medication.  He had questions about use of energy drinks and we discussed that in full details. All of his questions has been answered.  Continue statin for his hyperlipidemia.  The patient is in agreement with the above plan. The patient left the office in stable condition.  The patient will follow up in 6 months sooner if  needed.   Medication Adjustments/Labs and Tests Ordered: Current medicines are reviewed at length with the patient today.  Concerns regarding medicines are outlined above.  No orders of the defined types were placed in this encounter.  No orders of the defined types were placed in this encounter.   Patient Instructions  Medication Instructions:  No medication changes. *If you need a refill on your cardiac medications before your next appointment, please call your pharmacy*   Lab Work:  None ordered If you have labs (blood work) drawn today and your tests are completely normal, you will receive your results only by: Marland Kitchen MyChart Message (if you have MyChart) OR . A paper copy in the mail If you have any lab test that is abnormal or we need to change your treatment, we will call you to review the results.   Testing/Procedures: None ordered   Follow-Up: At [facility], you and your health needs are our priority.  As part of our continuing mission to provide you with exceptional heart care, we have created designated Provider Care Teams.  These Care Teams include your primary Cardiologist (physician) and Advanced Practice Providers (APPs -  Physician Assistants and Nurse Practitioners) who all work together to provide you with the care you need, when you need it.  We recommend signing up for the patient portal called "MyChart".  Sign up information is provided on this After Visit Summary.  MyChart is used to connect with patients for Virtual Visits (Telemedicine).  Patients are able to view lab/test results, encounter notes, upcoming appointments, etc.  Non-urgent messages can be sent to your provider as well.   To learn more about what you can do with MyChart, go to [email or web address].    Your next appointment:   6 month(s)  The format for your next appointment:   In Person  Provider:   Thomasene Ripple, DO   Other Instructions NA     Adopting a Healthy Lifestyle.  Know  what a healthy weight is for you (roughly BMI <25) and aim to maintain this   Aim for 7+ servings of fruits and vegetables daily   65-80+ fluid ounces of water or unsweet tea for healthy kidneys   Limit to max 1 drink of alcohol per day; avoid smoking/tobacco   Limit animal fats in diet for cholesterol and heart health - choose grass fed whenever available   Avoid highly processed foods, and foods high in saturated/trans fats   Aim for low stress - take time to unwind and care for your mental health   Aim for 150 min of moderate intensity exercise weekly for heart health, and weights twice weekly for bone health   Aim for 7-9 hours of sleep daily   When it comes to diets, agreement about the perfect plan isnt easy to find, even among the experts. Experts at the [facility] [employer] developed an idea known as the Healthy Eating Plate. Just imagine a plate divided into logical, healthy portions.   The emphasis is on diet quality:   Load up on vegetables and fruits - one-half of your plate: Aim for color and variety, and remember that potatoes dont count.   Go for whole grains - one-quarter of your plate: Whole wheat, barley, wheat berries, quinoa, oats, brown rice, and foods made with them. If you want pasta, go with whole wheat pasta.   Protein power - one-quarter of your plate: Fish, chicken, beans, and nuts are all healthy, versatile protein sources. Limit red meat.   The diet, however, does go beyond the plate, offering a few other suggestions.   Use healthy plant oils, such as olive, canola, soy, corn, sunflower and peanut. Check the labels, and avoid partially hydrogenated oil, which have unhealthy trans fats.   If youre thirsty, drink water. Coffee and tea are good in moderation, but skip sugary drinks and limit milk and dairy products to one or two daily servings.   The type of carbohydrate in  the diet is more important than the amount. Some sources of  carbohydrates, such as vegetables, fruits, whole grains, and beans-are healthier than others.   Finally, stay active  Signed, Thomasene RippleKardie Marai Teehan, DO  11/06/2019 12:39 PM    [facility]

## 2019-11-06 NOTE — Patient Instructions (Signed)

## 2019-11-09 ENCOUNTER — Telehealth: Payer: Self-pay | Admitting: Cardiology

## 2019-11-09 NOTE — Telephone Encounter (Signed)
Patient called and wanted to speak directly with Dr. Servando Salina. There were no urgent issues that needed addressed by a triage nurse

## 2019-11-30 ENCOUNTER — Other Ambulatory Visit: Payer: Self-pay | Admitting: Surgery

## 2019-11-30 DIAGNOSIS — I712 Thoracic aortic aneurysm, without rupture, unspecified: Secondary | ICD-10-CM

## 2020-01-17 ENCOUNTER — Other Ambulatory Visit: Payer: Self-pay

## 2020-01-17 ENCOUNTER — Ambulatory Visit
Admission: RE | Admit: 2020-01-17 | Discharge: 2020-01-17 | Disposition: A | Discharge status: Home / Self Care | Payer: 59 | Source: Ambulatory Visit | Attending: Surgery | Admitting: Surgery

## 2020-01-17 ENCOUNTER — Encounter: Payer: Self-pay | Admitting: Surgery

## 2020-01-17 ENCOUNTER — Ambulatory Visit (INDEPENDENT_AMBULATORY_CARE_PROVIDER_SITE_OTHER): Payer: 59 | Admitting: Surgery

## 2020-01-17 VITALS — BP 122/78 | HR 77 | Resp 18 | Ht 69.0 in | Wt 196.0 lb

## 2020-01-17 DIAGNOSIS — Z952 Presence of prosthetic heart valve: Secondary | ICD-10-CM | POA: Diagnosis not present

## 2020-01-17 DIAGNOSIS — I712 Thoracic aortic aneurysm, without rupture, unspecified: Secondary | ICD-10-CM

## 2020-01-17 MED ORDER — IOPAMIDOL (ISOVUE-370) INJECTION 76%
75.0000 mL | Freq: Once | INTRAVENOUS | Status: AC | PRN
  Administered 2020-01-17: 75 mL via INTRAVENOUS

## 2020-01-17 NOTE — Progress Notes (Signed)
HPI:  Patient returns for one year follow-up having undergone replacement of the supra coronary ascending aorta using a 28 mm Hemashield graft and aortic valve replacement using a 23 mm Edwards INSPIRIS RESILIA pericardial valve on 12/08/2018.  He has been feeling well with back pain.  He has no shortness of breath and his stamina is good.  Current Outpatient Medications  Medication Sig Dispense Refill  . aspirin EC 81 MG tablet Take 1 tablet (81 mg total) by mouth daily. 90 tablet 3  . atorvastatin (LIPITOR) 20 MG tablet TAKE 1 TABLET BY MOUTH EVERY DAY 90 tablet 1  . Coenzyme Q10 (COQ10) 100 MG CAPS Take 100 mg by mouth at bedtime.    Marland Kitchen loratadine (CLARITIN) 10 MG tablet Take 10 mg by mouth daily.     . Magnesium 250 MG TABS Take 250 mg by mouth in the morning and at bedtime.    . metoprolol succinate (TOPROL-XL) 25 MG 24 hr tablet TAKE 1 TABLET BY MOUTH EVERY DAY 90 tablet 1  . Milk Thistle 200 MG CAPS Take by mouth daily.    . Multiple Vitamin (MULTIVITAMIN) capsule Take 1 capsule by mouth daily.    . naproxen sodium (ALEVE) 220 MG tablet Take 440 mg by mouth 2 (two) times daily as needed (pain.).    Marland Kitchen nitroGLYCERIN (NITROSTAT) 0.4 MG SL tablet PLACE 1 TABLET (0.4 MG TOTAL) UNDER THE TONGUE EVERY 5 (FIVE) MINUTES AS NEEDED FOR CHEST PAIN. 25 tablet 5  . omeprazole (PRILOSEC) 20 MG capsule Take 20 mg by mouth daily.    Marland Kitchen OVER THE COUNTER MEDICATION daily. Beet Root     No current facility-administered medications for this visit.     Physical Exam: BP 122/78 (BP Location: Right Arm, Patient Position: Sitting)   Pulse 77   Resp 18   Ht 5\' 9"  (1.753 m)   Wt 196 lb (88.9 kg)   SpO2 96% Comment: RA with mask on  BMI 28.94 kg/m  Looks well. Cardiac exam shows a regular rate and rhythm with normal heart sounds.  There is no murmur. Lungs are clear. The sternotomy is well-healed and the sternum is stable There is no peripheral edema.  Diagnostic Tests:  Narrative & Impression   CLINICAL DATA:  Thoracic aortic aneurysm.  EXAM: CT ANGIOGRAPHY CHEST WITH CONTRAST  TECHNIQUE: Multidetector CT imaging of the chest was performed using the standard protocol during bolus administration of intravenous contrast. Multiplanar CT image reconstructions and MIPs were obtained to evaluate the vascular anatomy.  CONTRAST:  20mL ISOVUE-370 IOPAMIDOL (ISOVUE-370) INJECTION 76%  COMPARISON:  December 01, 2018.  FINDINGS: Cardiovascular: Status post aortic valve repair as well as repair of ascending thoracic aortic aneurysm. No aneurysmal dilatation or dissection is noted currently. Ascending thoracic aorta has maximum measured diameter of 3.5 cm. Transverse aortic arch measures 2.6 cm. Proximal descending thoracic aorta measures 2.6 cm. Normal cardiac size. No pericardial effusion. Great vessels are widely patent.  Mediastinum/Nodes: No enlarged mediastinal, hilar, or axillary lymph nodes. Thyroid gland, trachea, and esophagus demonstrate no significant findings.  Lungs/Pleura: Lungs are clear. No pleural effusion or pneumothorax.  Upper Abdomen: No acute abnormality.  Musculoskeletal: No chest wall abnormality. No acute or significant osseous findings.  Review of the MIP images confirms the above findings.  IMPRESSION: 1. Status post aortic valve repair as well as repair of ascending thoracic aortic aneurysm. No aneurysmal dilatation or dissection is noted currently. 2. No other abnormality seen in the chest.   Electronically  Signed   By: Lupita Raider M.D.   On: 01/17/2020 09:15     Impression:  He is doing well clinically 1 year following his surgery.  CTA of the chest today shows stable dimensions of the aortic arch and descending thoracic aorta which are within normal limits.  He has mild dilatation of the aortic root which appears stable.  I reviewed the CTA images with him and answered his questions.  I stressed the importance of  continued good blood pressure control and preventing further enlargement of his aorta.  Plan:  He will continue to follow-up with cardiology for periodic surveillance of his pericardial aortic valve.  I think he should probably have a follow-up CTA of the chest every few years to reevaluate the remainder of his aorta since we know that some patients with aortic aneurysms will develop further aneurysmal disease in the future.  There is no clear data on this to direct how often a follow-up scan should be done that I think every 5 years is reasonable.   Alleen Borne, MD [facility] [phone]

## 2020-02-15 ENCOUNTER — Other Ambulatory Visit: Payer: Self-pay | Admitting: Cardiology

## 2020-02-15 ENCOUNTER — Telehealth: Payer: Self-pay | Admitting: Cardiology

## 2020-02-15 DIAGNOSIS — Z952 Presence of prosthetic heart valve: Secondary | ICD-10-CM

## 2020-02-15 DIAGNOSIS — I350 Nonrheumatic aortic (valve) stenosis: Secondary | ICD-10-CM

## 2020-02-15 NOTE — Telephone Encounter (Signed)
Spoke with pt, he is not real sure if he needs the GXT this year or not but he wants to go ahead and schedule so it will be on the books. Aware he will need covid testing prior. Order placed and if he does not need it he will call and cancel.

## 2020-02-15 NOTE — Telephone Encounter (Signed)
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 He is requesting to have an order placed. Patient states he will need to have the stress test results before 03/26/20. Please assist.

## 2020-02-20 ENCOUNTER — Other Ambulatory Visit (HOSPITAL_COMMUNITY)
Admission: RE | Admit: 2020-02-20 | Discharge: 2020-02-20 | Disposition: A | Discharge status: Home / Self Care | Payer: 59 | Source: Ambulatory Visit | Attending: Cardiology | Admitting: Cardiology

## 2020-02-20 ENCOUNTER — Other Ambulatory Visit: Payer: Self-pay | Admitting: Cardiology

## 2020-02-20 DIAGNOSIS — Z20822 Contact with and (suspected) exposure to covid-19: Secondary | ICD-10-CM | POA: Insufficient documentation

## 2020-02-20 DIAGNOSIS — Z01812 Encounter for preprocedural laboratory examination: Secondary | ICD-10-CM | POA: Diagnosis present

## 2020-02-20 DIAGNOSIS — Z952 Presence of prosthetic heart valve: Secondary | ICD-10-CM

## 2020-02-20 NOTE — Addendum Note (Signed)
Addended by: Burnetta Sabin on: 02/20/2020 11:47 AM   Modules accepted: Orders

## 2020-02-21 LAB — SARS CORONAVIRUS 2 (TAT 6-24 HRS): SARS Coronavirus 2: NEGATIVE

## 2020-02-22 ENCOUNTER — Telehealth (HOSPITAL_COMMUNITY): Payer: Self-pay | Admitting: *Deleted

## 2020-02-22 NOTE — Telephone Encounter (Signed)
Close encounter 

## 2020-02-23 ENCOUNTER — Other Ambulatory Visit: Payer: Self-pay

## 2020-02-23 ENCOUNTER — Ambulatory Visit (HOSPITAL_COMMUNITY)
Admission: RE | Admit: 2020-02-23 | Discharge: 2020-02-23 | Disposition: A | Discharge status: Home / Self Care | Payer: 59 | Source: Ambulatory Visit | Attending: Cardiology | Admitting: Cardiology

## 2020-02-23 DIAGNOSIS — Z952 Presence of prosthetic heart valve: Secondary | ICD-10-CM | POA: Diagnosis not present

## 2020-02-23 LAB — EXERCISE TOLERANCE TEST
Estimated workload: 10.1 METS
Exercise duration (min): 9 min
Exercise duration (sec): 0 s
MPHR: 163 {beats}/min
Peak HR: 146 {beats}/min
Percent HR: 89 %
Rest HR: 67 {beats}/min

## 2020-02-26 ENCOUNTER — Telehealth: Payer: Self-pay

## 2020-02-26 NOTE — Telephone Encounter (Signed)
Spoke with patient regarding results and recommendation.  Patient verbalizes understanding and is agreeable to plan of care. Advised patient to call back with any issues or concerns.  

## 2020-02-26 NOTE — Telephone Encounter (Signed)
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 

## 2020-02-26 NOTE — Telephone Encounter (Signed)
Left message on patients voicemail to please return our call.   

## 2020-02-26 NOTE — Telephone Encounter (Signed)
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

## 2020-02-27 ENCOUNTER — Encounter: Payer: Self-pay | Admitting: Cardiology

## 2020-02-27 ENCOUNTER — Other Ambulatory Visit: Payer: Self-pay

## 2020-02-27 ENCOUNTER — Ambulatory Visit: Payer: 59 | Admitting: Cardiology

## 2020-02-27 ENCOUNTER — Telehealth: Payer: Self-pay | Admitting: Cardiology

## 2020-02-27 VITALS — BP 130/68 | HR 88 | Ht 69.0 in | Wt 202.0 lb

## 2020-02-27 DIAGNOSIS — I10 Essential (primary) hypertension: Secondary | ICD-10-CM | POA: Diagnosis not present

## 2020-02-27 DIAGNOSIS — Q231 Congenital insufficiency of aortic valve: Secondary | ICD-10-CM

## 2020-02-27 DIAGNOSIS — Z952 Presence of prosthetic heart valve: Secondary | ICD-10-CM

## 2020-02-27 DIAGNOSIS — I480 Paroxysmal atrial fibrillation: Secondary | ICD-10-CM | POA: Diagnosis not present

## 2020-02-27 DIAGNOSIS — I350 Nonrheumatic aortic (valve) stenosis: Secondary | ICD-10-CM | POA: Diagnosis not present

## 2020-02-27 DIAGNOSIS — I77810 Thoracic aortic ectasia: Secondary | ICD-10-CM

## 2020-02-27 DIAGNOSIS — Z95828 Presence of other vascular implants and grafts: Secondary | ICD-10-CM

## 2020-02-27 DIAGNOSIS — E782 Mixed hyperlipidemia: Secondary | ICD-10-CM

## 2020-02-27 NOTE — Patient Instructions (Signed)

## 2020-02-27 NOTE — Telephone Encounter (Signed)
Left message on patients voicemail to please return our call.   

## 2020-02-27 NOTE — Telephone Encounter (Signed)
Patient states he is returning a call he received today (02/27/20). However, he is unsure of what the call may have been regarding. May be regarding today's office visit with Dr. Servando Salina or recent lab results (see previous encounter).

## 2020-02-27 NOTE — Progress Notes (Signed)
Cardiology Office Note:    Date:  02/27/2020   ID:  Cody Hebert, DOB [birth year], MRN [id number]  PCP:  Pcp, No  Cardiologist:  Thomasene Ripple, DO  Electrophysiologist:  None   Referring MD: No ref. provider found   " I am ok"  History of Present Illness:    Cody Hebert is a 60 y.o. male with a hx of hyperlipidemia, history of bicuspid aortic valve, Severe aortic stenosisAVRwith a 23 mm Edwards INSPIRIS RESILIAand suparcoronary replacement of his ascending aorta with a 79mm Hemasheild graftdue to acute dissection in the operating roomon 12/08/2018 and postop atrial fibrillation now off amiodarone.  I saw the patient in September 2021 at that time he appears to be doing well from a cardiovascular standpoint.  He had not had any recurrence of atrial fibrillation.  We recommend a 32-month follow-up.  Since his last visit he has lost his wife and this has been very stressful for him.  He did follow-up with CT surgery in the meantime as well.  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  He offers no complaints at this time.  Past Medical History:  Diagnosis Date  . Aortic stenosis   . Ascending aorta dilatation (HCC)   . Bicuspid aortic valve 11/07/2018  . Chest pain of uncertain etiology 11/07/2018  . Essential hypertension 01/09/2019  . GERD (gastroesophageal reflux disease)   . Hyperlipidemia   . Mixed hyperlipidemia 11/07/2018  . Pneumonia    hx in the past  . Post operative atrial fibrillation 01/09/2019  . S/P ascending aortic replacement 12/09/2018  . S/P AVR (aortic valve replacement) and aortoplasty 12/08/2018  . Severe aortic valve stenosis     Past Surgical History:  Procedure Laterality Date  . ANTERIOR CRUCIATE LIGAMENT REPAIR    . AORTIC VALVE REPLACEMENT N/A 12/08/2018   Procedure: AORTIC VALVE REPLACEMENT (AVR);  Surgeon: Alleen Borne, MD;  Location: [facility] OR;  Service: Open Heart Surgery;  Laterality: N/A;  . EYE  SURGERY    . REPLACEMENT ASCENDING AORTA N/A 12/08/2018   Procedure: Replacement Ascending Aorta SUPRACORONARY;  Surgeon: Alleen Borne, MD;  Location: [facility] OR;  Service: Open Heart Surgery;  Laterality: N/A;  . RIGHT/LEFT HEART CATH AND CORONARY ANGIOGRAPHY N/A 11/22/2018   Procedure: RIGHT/LEFT HEART CATH AND CORONARY ANGIOGRAPHY;  Surgeon: Corky Crafts, MD;  Location: [facility] INVASIVE CV LAB;  Service: Cardiovascular;  Laterality: N/A;  . TEE WITHOUT CARDIOVERSION N/A 12/08/2018   Procedure: TRANSESOPHAGEAL ECHOCARDIOGRAM (TEE);  Surgeon: Alleen Borne, MD;  Location: [facility] OR;  Service: Open Heart Surgery;  Laterality: N/A;  . VASECTOMY      Current Medications: Current Meds  Medication Sig  . aspirin EC 81 MG tablet Take 1 tablet (81 mg total) by mouth daily.  Marland Kitchen atorvastatin (LIPITOR) 20 MG tablet TAKE 1 TABLET BY MOUTH EVERY DAY  . Coenzyme Q10 (COQ10) 100 MG CAPS Take 100 mg by mouth at bedtime.  Marland Kitchen loratadine (CLARITIN) 10 MG tablet Take 10 mg by mouth daily.  . Magnesium 250 MG TABS Take 250 mg by mouth in the morning and at bedtime.  . metoprolol succinate (TOPROL-XL) 25 MG 24 hr tablet TAKE 1 TABLET BY MOUTH EVERY DAY  . Milk Thistle 200 MG CAPS Take by mouth daily.  . Multiple Vitamin (MULTIVITAMIN) capsule Take 1 capsule by mouth daily.  . naproxen sodium (ALEVE) 220 MG tablet Take 440 mg by mouth 2 (two) times daily as needed (  pain.).  Marland Kitchen nitroGLYCERIN (NITROSTAT) 0.4 MG SL tablet PLACE 1 TABLET (0.4 MG TOTAL) UNDER THE TONGUE EVERY 5 (FIVE) MINUTES AS NEEDED FOR CHEST PAIN.  Marland Kitchen omeprazole (PRILOSEC) 20 MG capsule Take 20 mg by mouth daily.  Marland Kitchen OVER THE COUNTER MEDICATION daily. Beet Root     Allergies:   Doxycycline   Social History   Socioeconomic History  . Marital status: Married    Spouse name: Not on file  . Number of children: Not on file  . Years of education: Not on file  . Highest education level: Not on file  Occupational History  . Not on file  Tobacco  Use  . Smoking status: Current Every Day Smoker    Packs/day: 1.50    Years: 40.00    Pack years: 60.00    Types: Cigarettes  . Smokeless tobacco: Never Used  Vaping Use  . Vaping Use: Former  Substance and Sexual Activity  . Alcohol use: Yes    Alcohol/week: 14.0 standard drinks    Types: 14 Cans of beer per week  . Drug use: Never  . Sexual activity: Not on file  Other Topics Concern  . Not on file  Social History Narrative  . Not on file   Social Determinants of Health   Financial Resource Strain: Not on file  Food Insecurity: Not on file  Transportation Needs: Not on file  Physical Activity: Not on file  Stress: Not on file  Social Connections: Not on file     Family History: The patient's family history includes Cancer in his father; Congestive Heart Failure in his maternal grandmother; Diabetes in his brother.  ROS:   Review of Systems  Constitution: Negative for decreased appetite, fever and weight gain.  HENT: Negative for congestion, ear discharge, hoarse voice and sore throat.   Eyes: Negative for discharge, redness, vision loss in right eye and visual halos.  Cardiovascular: Negative for chest pain, dyspnea on exertion, leg swelling, orthopnea and palpitations.  Respiratory: Negative for cough, hemoptysis, shortness of breath and snoring.   Endocrine: Negative for heat intolerance and polyphagia.  Hematologic/Lymphatic: Negative for bleeding problem. Does not bruise/bleed easily.  Skin: Negative for flushing, nail changes, rash and suspicious lesions.  Musculoskeletal: Negative for arthritis, joint pain, muscle cramps, myalgias, neck pain and stiffness.  Gastrointestinal: Negative for abdominal pain, bowel incontinence, diarrhea and excessive appetite.  Genitourinary: Negative for decreased libido, genital sores and incomplete emptying.  Neurological: Negative for brief paralysis, focal weakness, headaches and loss of balance.  Psychiatric/Behavioral:  Negative for altered mental status, depression and suicidal ideas.  Allergic/Immunologic: Negative for HIV exposure and persistent infections.    EKGs/Labs/Other Studies Reviewed:    The following studies were reviewed today:   EKG: None today  Exercise tolerance test  There was no ST segment deviation noted during stress.  No T wave inversion was noted during stress.   1. Non-diagnostic ECG stress test for ischemia due to baseline LVH and IVCD. 2. Average exercise capacity (9:00 min:s; 10.1 METS). 3. Normal heart rate/blood pressure response to exercise.  4. No exercise induced arrhythmias.    Recent Labs: 06/05/2019: BUN 14; Creatinine, Ser 1.00; Hemoglobin 14.0; Magnesium 2.0; Platelets 252; Potassium 4.2; Sodium 141  Recent Lipid Panel    Component Value Date/Time   CHOL 213 (H) 11/07/2018 0901   TRIG 111 11/07/2018 0901   HDL 45 11/07/2018 0901   CHOLHDL 4.7 11/07/2018 0901   LDLCALC 148 (H) 11/07/2018 0901    Physical  Exam:    VS:  BP 130/68   Pulse 88   Ht 5\' 9"  (1.753 m)   Wt 202 lb (91.6 kg)   SpO2 96%   BMI 29.83 kg/m     Wt Readings from Last 3 Encounters:  02/27/20 202 lb (91.6 kg)  01/17/20 196 lb (88.9 kg)  11/06/19 192 lb (87.1 kg)     GEN: Well nourished, well developed in no acute distress HEENT: Normal NECK: No JVD; No carotid bruits LYMPHATICS: No lymphadenopathy CARDIAC: S1S2 noted,RRR, no murmurs, rubs, gallops RESPIRATORY:  Clear to auscultation without rales, wheezing or rhonchi  ABDOMEN: Soft, non-tender, non-distended, +bowel sounds, no guarding. EXTREMITIES: No edema, No cyanosis, no clubbing MUSCULOSKELETAL:  No deformity  SKIN: Warm and dry NEUROLOGIC:  Alert and oriented x 3, non-focal PSYCHIATRIC:  Normal affect, good insight  ASSESSMENT:    1. Bicuspid aortic valve   2. Severe aortic valve stenosis   3. Post operative atrial fibrillation   4. Essential hypertension   5. Ascending aorta dilatation (HCC)   6. Mixed  hyperlipidemia   7. S/P AVR (aortic valve replacement) and aortoplasty   8. S/P ascending aortic replacement    PLAN:     1.  He appears to be doing well from a cardiovascular standpoint.  At this time we will continue his current medical regimen.  He had a recent CT scan in January 2021 which I reviewed with no evidence of any aneurysm.  His most recent echocardiogram was in January 2021 for repeat in another 12 months.  2.  Hyperlipidemia - continue with current statin medication.  3. no recurrence of atrial fibrillation.  The patient is in agreement with the above plan. The patient left the office in stable condition.  The patient will follow up in 1 year or sooner if needed.   Medication Adjustments/Labs and Tests Ordered: Current medicines are reviewed at length with the patient today.  Concerns regarding medicines are outlined above.  No orders of the defined types were placed in this encounter.  No orders of the defined types were placed in this encounter.   Patient Instructions  Medication Instructions:  Your physician recommends that you continue on your current medications as directed. Please refer to the Current Medication list given to you today.  *If you need a refill on your cardiac medications before your next appointment, please call your pharmacy*   Lab Work: None. If you have labs (blood work) drawn today and your tests are completely normal, you will receive your results only by: February 2021 MyChart Message (if you have MyChart) OR . A paper copy in the mail If you have any lab test that is abnormal or we need to change your treatment, we will call you to review the results.   Testing/Procedures: None.   Follow-Up: At [facility], you and your health needs are our priority.  As part of our continuing mission to provide you with exceptional heart care, we have created designated Provider Care Teams.  These Care Teams include your primary Cardiologist (physician)  and Advanced Practice Providers (APPs -  Physician Assistants and Nurse Practitioners) who all work together to provide you with the care you need, when you need it.  We recommend signing up for the patient portal called "MyChart".  Sign up information is provided on this After Visit Summary.  MyChart is used to connect with patients for Virtual Visits (Telemedicine).  Patients are able to view lab/test results, encounter notes, upcoming appointments, etc.  Non-urgent messages can be sent to your provider as well.   To learn more about what you can do with MyChart, go to [email or web address].    Your next appointment:   1 year(s)  The format for your next appointment:   In Person  Provider:   Thomasene RippleKardie Merary Garguilo, DO   Other Instructions      Adopting a Healthy Lifestyle.  Know what a healthy weight is for you (roughly BMI <25) and aim to maintain this   Aim for 7+ servings of fruits and vegetables daily   65-80+ fluid ounces of water or unsweet tea for healthy kidneys   Limit to max 1 drink of alcohol per day; avoid smoking/tobacco   Limit animal fats in diet for cholesterol and heart health - choose grass fed whenever available   Avoid highly processed foods, and foods high in saturated/trans fats   Aim for low stress - take time to unwind and care for your mental health   Aim for 150 min of moderate intensity exercise weekly for heart health, and weights twice weekly for bone health   Aim for 7-9 hours of sleep daily   When it comes to diets, agreement about the perfect plan isnt easy to find, even among the experts. Experts at the [employer] developed an idea known as the Healthy Eating Plate. Just imagine a plate divided into logical, healthy portions.   The emphasis is on diet quality:   Load up on vegetables and fruits - one-half of your plate: Aim for color and variety, and remember that potatoes dont count.   Go for whole grains - one-quarter  of your plate: Whole wheat, barley, wheat berries, quinoa, oats, brown rice, and foods made with them. If you want pasta, go with whole wheat pasta.   Protein power - one-quarter of your plate: Fish, chicken, beans, and nuts are all healthy, versatile protein sources. Limit red meat.   The diet, however, does go beyond the plate, offering a few other suggestions.   Use healthy plant oils, such as olive, canola, soy, corn, sunflower and peanut. Check the labels, and avoid partially hydrogenated oil, which have unhealthy trans fats.   If youre thirsty, drink water. Coffee and tea are good in moderation, but skip sugary drinks and limit milk and dairy products to one or two daily servings.   The type of carbohydrate in the diet is more important than the amount. Some sources of carbohydrates, such as vegetables, fruits, whole grains, and beans-are healthier than others.   Finally, stay active  Signed, Thomasene RippleKardie Joury Allcorn, DO  02/27/2020 10:19 AM    [facility]

## 2020-02-27 NOTE — Telephone Encounter (Signed)
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 Servando Salina does not feel comfortable filling out. He verbalizes understanding and thanks me for the call back.

## 2020-02-28 NOTE — Addendum Note (Signed)
Addended by: Thomasene Ripple on: 02/28/2020 08:41 AM   Modules accepted: Orders

## 2020-03-07 ENCOUNTER — Telehealth: Payer: Self-pay | Admitting: Cardiology

## 2020-03-07 NOTE — Telephone Encounter (Signed)
Spoke with patient.

## 2020-03-07 NOTE — Telephone Encounter (Signed)
He had his exercise tolerance test today. He is cleared to go back to work.  Ok to write the letter for clearance. Hayley please write it in [place] I will sign and we can fax it to [place]. He needs it tomorrow.

## 2020-03-07 NOTE — Telephone Encounter (Signed)
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 He also wanted to get a call back from Dr. Servando Salina

## 2020-03-08 ENCOUNTER — Encounter: Payer: Self-pay | Admitting: Emergency Medicine

## 2020-03-08 NOTE — Telephone Encounter (Signed)
Letter printed will give to Dr. Servando Salina.

## 2020-03-08 NOTE — Telephone Encounter (Signed)
Faxed letter to [employer].

## 2020-05-07 ENCOUNTER — Ambulatory Visit: Payer: 59 | Admitting: Cardiology

## 2020-05-10 ENCOUNTER — Other Ambulatory Visit: Payer: Self-pay | Admitting: Cardiology

## 2020-05-10 NOTE — Telephone Encounter (Signed)
Atorvastatin approved and sent 

## 2020-08-12 ENCOUNTER — Other Ambulatory Visit: Payer: Self-pay | Admitting: Cardiology

## 2020-08-12 NOTE — Telephone Encounter (Signed)
RX approved and sent

## 2021-02-19 ENCOUNTER — Encounter: Payer: Self-pay | Admitting: *Deleted

## 2021-02-19 ENCOUNTER — Other Ambulatory Visit: Payer: Self-pay

## 2021-02-19 ENCOUNTER — Ambulatory Visit: Payer: 59 | Admitting: Cardiology

## 2021-02-19 ENCOUNTER — Encounter: Payer: Self-pay | Admitting: Cardiology

## 2021-02-19 VITALS — BP 108/72 | HR 80 | Ht 69.0 in | Wt 189.2 lb

## 2021-02-19 DIAGNOSIS — Z952 Presence of prosthetic heart valve: Secondary | ICD-10-CM

## 2021-02-19 DIAGNOSIS — I480 Paroxysmal atrial fibrillation: Secondary | ICD-10-CM

## 2021-02-19 DIAGNOSIS — I350 Nonrheumatic aortic (valve) stenosis: Secondary | ICD-10-CM

## 2021-02-19 DIAGNOSIS — Z95828 Presence of other vascular implants and grafts: Secondary | ICD-10-CM

## 2021-02-19 DIAGNOSIS — Q231 Congenital insufficiency of aortic valve: Secondary | ICD-10-CM

## 2021-02-19 DIAGNOSIS — I77810 Thoracic aortic ectasia: Secondary | ICD-10-CM

## 2021-02-19 DIAGNOSIS — R0602 Shortness of breath: Secondary | ICD-10-CM

## 2021-02-19 DIAGNOSIS — E782 Mixed hyperlipidemia: Secondary | ICD-10-CM

## 2021-02-19 DIAGNOSIS — I10 Essential (primary) hypertension: Secondary | ICD-10-CM

## 2021-02-19 NOTE — Patient Instructions (Signed)
Medication Instructions:  Your physician recommends that you continue on your current medications as directed. Please refer to the Current Medication list given to you today.  *If you need a refill on your cardiac medications before your next appointment, please call your pharmacy*   Lab Work: None If you have labs (blood work) drawn today and your tests are completely normal, you will receive your results only by: MyChart Message (if you have MyChart) OR A paper copy in the mail If you have any lab test that is abnormal or we need to change your treatment, we will call you to review the results.   Testing/Procedures: Your physician has requested that you have an exercise tolerance test. For further information please visit [email or web address]. Please also follow instruction sheet, as given.  Your physician has requested that you have an echocardiogram. Echocardiography is a painless test that uses sound waves to create images of your heart. It provides your doctor with information about the size and shape of your heart and how well your hearts chambers and valves are working. This procedure takes approximately one hour. There are no restrictions for this procedure.   Follow-Up: At [facility], you and your health needs are our priority.  As part of our continuing mission to provide you with exceptional heart care, we have created designated Provider Care Teams.  These Care Teams include your primary Cardiologist (physician) and Advanced Practice Providers (APPs -  Physician Assistants and Nurse Practitioners) who all work together to provide you with the care you need, when you need it.  We recommend signing up for the patient portal called "MyChart".  Sign up information is provided on this After Visit Summary.  MyChart is used to connect with patients for Virtual Visits (Telemedicine).  Patients are able to view lab/test results, encounter notes, upcoming appointments, etc.  Non-urgent  messages can be sent to your provider as well.   To learn more about what you can do with MyChart, go to [email or web address].    Your next appointment:   1 year(s)  The format for your next appointment:   In Person  Provider:   Thomasene RippleKardie Tobb, DO     Other Instructions  KardiaMobile [email or web address]        FDA-cleared, clinical grade mobile EKG monitor: Lourena SimmondsKardia is the most clinically-validated mobile EKG used by the world's leading cardiac care medical professionals With Basic service, know instantly if your heart rhythm is normal or if atrial fibrillation is detected, and email the last single EKG recording to yourself or your doctor Premium service, available for purchase through the Kardia app for $9.99 per month or $99 per year, includes unlimited history and storage of your EKG recordings, a monthly EKG summary report to share with your doctor, along with the ability to track your blood pressure, activity and weight Includes one KardiaMobile phone clip FREE SHIPPING: Standard delivery 1-3 business days. Orders placed by 11:00am PST will ship that afternoon. Otherwise, will ship next business day. All orders ship via [employer] Priority Mail from [place], [place]    [employer]ardie Mobile - sending an EKG [employer] app and set up profile. Run EKG - by placing 1-2 fingers on the silver plates After EKG is complete - Download PDF  - Skip password (if you apply a password the provider will need it to view the EKG) Click share button (square with upward arrow) in bottom left corner To send: choose MyChart (first time log into MyChart)  Pop up window about sending ECG  Click continue Choose type of message Choose provider Type subject and message Click send (EKG should be attached)  - To send additional EKGs in one message click the paperclip image and bottom of page to attach.    Echocardiogram An echocardiogram is a test that uses sound waves (ultrasound) to  produce images of the heart. Images from an echocardiogram can provide important information about: Heart size and shape. The size and thickness and movement of your heart's walls. Heart muscle function and strength. Heart valve function or if you have stenosis. Stenosis is when the heart valves are too narrow. If blood is flowing backward through the heart valves (regurgitation). A tumor or infectious growth around the heart valves. Areas of heart muscle that are not working well because of poor blood flow or injury from a heart attack. Aneurysm detection. An aneurysm is a weak or damaged part of an artery wall. The wall bulges out from the normal force of blood pumping through the body. Tell a health care provider about: Any allergies you have. All medicines you are taking, including vitamins, herbs, eye drops, creams, and over-the-counter medicines. Any blood disorders you have. Any surgeries you have had. Any medical conditions you have. Whether you are pregnant or may be pregnant. What are the risks? Generally, this is a safe test. However, problems may occur, including an allergic reaction to dye (contrast) that may be used during the test. What happens before the test? No specific preparation is needed. You may eat and drink normally. What happens during the test?  You will take off your clothes from the waist up and put on a hospital gown. Electrodes or electrocardiogram (ECG)patches may be placed on your chest. The electrodes or patches are then connected to a device that monitors your heart rate and rhythm. You will lie down on a table for an ultrasound exam. A gel will be applied to your chest to help sound waves pass through your skin. A handheld device, called a transducer, will be pressed against your chest and moved over your heart. The transducer produces sound waves that travel to your heart and bounce back (or "echo" back) to the transducer. These sound waves will be  captured in real-time and changed into images of your heart that can be viewed on a video monitor. The images will be recorded on a computer and reviewed by your health care provider. You may be asked to change positions or hold your breath for a short time. This makes it easier to get different views or better views of your heart. In some cases, you may receive contrast through an IV in one of your veins. This can improve the quality of the pictures from your heart. The procedure may vary among health care providers and hospitals. What can I expect after the test? You may return to your normal, everyday life, including diet, activities, and medicines, unless your health care provider tells you not to do that. Follow these instructions at home: It is up to you to get the results of your test. Ask your health care provider, or the department that is doing the test, when your results will be ready. Keep all follow-up visits. This is important. Summary An echocardiogram is a test that uses sound waves (ultrasound) to produce images of the heart. Images from an echocardiogram can provide important information about the size and shape of your heart, heart muscle function, heart valve function, and other possible heart problems. You do not need to  do anything to prepare before this test. You may eat and drink normally. After the echocardiogram is completed, you may return to your normal, everyday life, unless your health care provider tells you not to do that. This information is not intended to replace advice given to you by your health care provider. Make sure you discuss any questions you have with your health care provider. Document Revised: 10/16/2020 Document Reviewed: 09/26/2019 Elsevier Patient Education  2022 [employer].

## 2021-02-19 NOTE — Progress Notes (Addendum)
Cardiology Office Note:    Date:  02/19/2021   ID:  Cody Hebert Breiner, DOB 03-09-1960, MRN [id number]  PCP:  Pcp, No  Cardiologist:  Thomasene RippleKardie Patriciann Becht, DO  Electrophysiologist:  None   Referring MD: No ref. provider found   " I am having some shortness of breath."  History of Present Illness:    Cody Hebert Mullan is a 61 y.o. 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   I saw the patient in September 2021 at that time he appears to be doing well from a cardiovascular standpoint.  He had not had any recurrence of atrial fibrillation.  We recommend a 1566-month follow-up.  I saw the patient on February 17, 2020 at that time he appeared to be doing well from a cardiovascular standpoint.  He had lost his wife but he was doing okay.  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  He recently tells me that he he has been experiencing some shortness of breath.  He did have a transient viral infection but notes that the shortness of breath has persisted.  He tells me he has had intermittent palpitations.  They are transient and not long-lasting.  He is not quite concerned about this but he brought this up to me because he has had postop atrial fibrillation.  Of note he tells me that his DOT physical information needs to be updated with his exercise stress test.   Past Medical History:  Diagnosis Date   Aortic stenosis    Ascending aorta dilatation (HCC)    Bicuspid aortic valve 11/07/2018   Chest pain of uncertain etiology 11/07/2018   Essential hypertension 01/09/2019   GERD (gastroesophageal reflux disease)    Hyperlipidemia    Mixed hyperlipidemia 11/07/2018   Pneumonia    hx in the  past   Post operative atrial fibrillation 01/09/2019   S/P ascending aortic replacement 12/09/2018   S/P AVR (aortic valve replacement) and aortoplasty 12/08/2018   Severe aortic valve stenosis     Past Surgical History:  Procedure Laterality Date   ANTERIOR CRUCIATE LIGAMENT REPAIR     AORTIC VALVE REPLACEMENT N/A 12/08/2018   Procedure: AORTIC VALVE REPLACEMENT (AVR);  Surgeon: Alleen BorneBartle, Bryan K, MD;  [facility];  Service: Open Heart Surgery;  Laterality: N/A;   EYE SURGERY     REPLACEMENT ASCENDING AORTA N/A 12/08/2018   Procedure: Replacement Ascending Aorta SUPRACORONARY;  Surgeon: Alleen BorneBartle, Bryan K, MD;  Location: [facility] OR;  Service: Open Heart Surgery;  Laterality: N/A;   RIGHT/LEFT HEART CATH AND CORONARY ANGIOGRAPHY N/A 11/22/2018   Procedure: RIGHT/LEFT HEART CATH AND CORONARY ANGIOGRAPHY;  Surgeon: Corky CraftsVaranasi, Jayadeep S, MD;  [facility];  Service: Cardiovascular;  Laterality: N/A;   TEE WITHOUT CARDIOVERSION N/A 12/08/2018   Procedure: TRANSESOPHAGEAL ECHOCARDIOGRAM (TEE);  Surgeon: Alleen BorneBartle, Bryan K, MD;  [facility];  Service: Open Heart Surgery;  Laterality: N/A;   VASECTOMY      Current Medications: Current Meds  Medication Sig   aspirin EC 81 MG tablet Take 1 tablet (81 mg total) by mouth daily.   atorvastatin (LIPITOR) 20 MG tablet TAKE 1 TABLET BY MOUTH EVERY DAY  Coenzyme Q10 (COQ10) 100 MG CAPS Take 100 mg by mouth at bedtime.   loratadine (CLARITIN) 10 MG tablet Take 10 mg by mouth daily.   Magnesium 250 MG TABS Take 250 mg by mouth in the morning and at bedtime.   metoprolol succinate (TOPROL-XL) 25 MG 24 hr tablet TAKE 1 TABLET BY MOUTH EVERY DAY   Milk Thistle 200 MG CAPS Take by mouth daily.   Multiple Vitamin (MULTIVITAMIN) capsule Take 1 capsule by mouth daily.   naproxen sodium (ALEVE) 220 MG tablet Take 440 mg by mouth 2 (two) times daily as needed (pain.).   nitroGLYCERIN (NITROSTAT) 0.4 MG SL tablet PLACE 1 TABLET (0.4 MG TOTAL) UNDER  THE TONGUE EVERY 5 (FIVE) MINUTES AS NEEDED FOR CHEST PAIN.   omeprazole (PRILOSEC) 20 MG capsule Take 20 mg by mouth daily.   OVER THE COUNTER MEDICATION daily. Beet Root     Allergies:   Doxycycline   Social History   Socioeconomic History   Marital status: Married    Spouse name: Not on file   Number of children: Not on file   Years of education: Not on file   Highest education level: Not on file  Occupational History   Not on file  Tobacco Use   Smoking status: Every Day    Packs/day: 1.50    Years: 40.00    Pack years: 60.00    Types: Cigarettes   Smokeless tobacco: Never  Vaping Use   Vaping Use: Former  Substance and Sexual Activity   Alcohol use: Yes    Alcohol/week: 14.0 standard drinks    Types: 14 Cans of beer per week   Drug use: Never   Sexual activity: Not on file  Other Topics Concern   Not on file  Social History Narrative   Not on file   Social Determinants of Health   Financial Resource Strain: Not on file  Food Insecurity: Not on file  Transportation Needs: Not on file  Physical Activity: Not on file  Stress: Not on file  Social Connections: Not on file     Family History: The patient's family history includes Cancer in his father; Congestive Heart Failure in his maternal grandmother; Diabetes in his brother.  ROS:   Review of Systems  Constitution: Negative for decreased appetite, fever and weight gain.  HENT: Negative for congestion, ear discharge, hoarse voice and sore throat.   Eyes: Negative for discharge, redness, vision loss in right eye and visual halos.  Cardiovascular: Negative for chest pain, dyspnea on exertion, leg swelling, orthopnea and palpitations.  Respiratory: Negative for cough, hemoptysis, shortness of breath and snoring.   Endocrine: Negative for heat intolerance and polyphagia.  Hematologic/Lymphatic: Negative for bleeding problem. Does not bruise/bleed easily.  Skin: Negative for flushing, nail changes, rash and  suspicious lesions.  Musculoskeletal: Negative for arthritis, joint pain, muscle cramps, myalgias, neck pain and stiffness.  Gastrointestinal: Negative for abdominal pain, bowel incontinence, diarrhea and excessive appetite.  Genitourinary: Negative for decreased libido, genital sores and incomplete emptying.  Neurological: Negative for brief paralysis, focal weakness, headaches and loss of balance.  Psychiatric/Behavioral: Negative for altered mental status, depression and suicidal ideas.  Allergic/Immunologic: Negative for HIV exposure and persistent infections.    EKGs/Labs/Other Studies Reviewed:    The following studies were reviewed today:   EKG:  The ekg ordered today demonstrates sinus rhythm  Exercise tolerance test There was no ST segment deviation noted during stress. No T wave inversion was noted during stress.  1. Non-diagnostic ECG stress test for ischemia due to baseline LVH and IVCD. 2. Average exercise capacity (9:00 min:s; 10.1 METS). 3. Normal heart rate/blood pressure response to exercise.  4. No exercise induced arrhythmias.     March 07, 2019 IMPRESSIONS     1. Left ventricular ejection fraction, by visual estimation, is 60 to  65%. The left ventricle has normal function. There is moderately increased  left ventricular hypertrophy.   2. Left ventricular diastolic parameters are consistent with Grade II  diastolic dysfunction (pseudonormalization).   3. Global right ventricle has normal systolic function.The right  ventricular size is normal. No increase in right ventricular wall  thickness.   4. Left atrial size was normal.   5. Right atrial size was normal.   6. The mitral valve is normal in structure. No evidence of mitral valve  regurgitation. No evidence of mitral stenosis.   7. The tricuspid valve is normal in structure. Tricuspid valve  regurgitation is not demonstrated.   8. Well seated bioprosthetic 23mm Edwards valve. Aortic valve   regurgitation is not visualized. No evidence of aortic valve sclerosis or  stenosis.   9. The pulmonic valve was normal in structure. Pulmonic valve  regurgitation is not visualized.  10. Unable to obtain Pulmonary Artery Systolic Pressure, No TR jet  spectral display.   FINDINGS   Left Ventricle: Left ventricular ejection fraction, by visual estimation,  is 60 to 65%. The left ventricle has normal function. The left ventricle  is not well visualized. Moderately increased left ventricular posterior  wall thickness. There is  moderately increased left ventricular hypertrophy. Left ventricular  diastolic parameters are consistent with Grade II diastolic dysfunction  (pseudonormalization). Normal left atrial pressure.   Right Ventricle: The right ventricular size is normal. No increase in  right ventricular wall thickness. Global RV systolic function is has  normal systolic function.   Left Atrium: Left atrial size was normal in size.   Right Atrium: Right atrial size was normal in size   Pericardium: There is no evidence of pericardial effusion.   Mitral Valve: The mitral valve is normal in structure. No evidence of  mitral valve regurgitation. No evidence of mitral valve stenosis by  observation.   Tricuspid Valve: The tricuspid valve is normal in structure. Tricuspid  valve regurgitation is not demonstrated.   Aortic Valve: The aortic valve has been repaired/replaced. Aortic valve  regurgitation is not visualized. The aortic valve is structurally normal,  with no evidence of sclerosis or stenosis. Aortic valve mean gradient  measures 11.3 mmHg. Aortic valve peak  gradient measures 20.3 mmHg. Aortic valve area, by VTI measures 1.58 cm.  23mm Edwards bovine aortic valve bioprosthesis valve is present in the  aortic position.   Pulmonic Valve: The pulmonic valve was normal in structure. Pulmonic valve  regurgitation is not visualized. Pulmonic regurgitation is not  visualized.   Aorta: The aortic root, ascending aorta and aortic arch are all  structurally normal, with no evidence of dilitation or obstruction.   Venous: The inferior vena cava is normal in size with greater than 50%  respiratory variability, suggesting right atrial pressure of 3 mmHg.   IAS/Shunts: No atrial level shunt detected by color flow Doppler. There is  no evidence of a patent foramen ovale. No ventricular septal defect is  seen or detected. There is no evidence of an atrial septal defect.   Recent Labs: No results found for requested labs within last 8760 hours.  Recent Lipid Panel  Component Value Date/Time   CHOL 213 (H) 11/07/2018 0901   TRIG 111 11/07/2018 0901   HDL 45 11/07/2018 0901   CHOLHDL 4.7 11/07/2018 0901   LDLCALC 148 (H) 11/07/2018 0901    Physical Exam:    VS:  BP 108/72    Pulse 80    Ht 5\' 9"  (1.753 m)    Wt 189 lb 3.2 oz (85.8 kg)    SpO2 97%    BMI 27.94 kg/m     Wt Readings from Last 3 Encounters:  02/19/21 189 lb 3.2 oz (85.8 kg)  02/27/20 202 lb (91.6 kg)  01/17/20 196 lb (88.9 kg)     GEN: Well nourished, well developed in no acute distress HEENT: Normal NECK: No JVD; No carotid bruits LYMPHATICS: No lymphadenopathy CARDIAC: S1S2 noted,RRR, no murmurs, rubs, gallops RESPIRATORY:  Clear to auscultation without rales, wheezing or rhonchi  ABDOMEN: Soft, non-tender, non-distended, +bowel sounds, no guarding. EXTREMITIES: No edema, No cyanosis, no clubbing MUSCULOSKELETAL:  No deformity  SKIN: Warm and dry NEUROLOGIC:  Alert and oriented x 3, non-focal PSYCHIATRIC:  Normal affect, good insight  ASSESSMENT:    1. SOB (shortness of breath)   2. S/P AVR (aortic valve replacement) and aortoplasty   3. Bicuspid aortic valve   4. Severe aortic valve stenosis   5. Post operative atrial fibrillation   6. Essential hypertension   7. Ascending aorta dilatation (HCC)   8. S/P ascending aortic replacement   9. Mixed hyperlipidemia     PLAN:    He is experiencing shortness of breath, and he is also due for repeat echocardiogram to assess his bioprosthetic valve.  I like to get an echocardiogram to assess LV function and also his valve.  He also need a exercise tolerance test that would help support for his DOT physical as well as understating his exercise capacity as well.  Blood pressure is acceptable, continue with current antihypertensive regimen.  Hyperlipidemia - continue with current statin medication.  He is having palpitations, I have asked the patient to get mobile kardia because this is intermittent.  I really encouraged him to do days because of his postop atrial fibrillation and make sure that he is not having episodes of A. Fib.  He is agreeable to do this.  Will notify me if he has symptoms he uses mobile cardiac exam information for my review.  He understands if need for any dental work he will need antibiotic prophylaxis.  The patient is in agreement with the above plan. The patient left the office in stable condition.  The patient will follow up in 1 year or sooner if needed.   Medication Adjustments/Labs and Tests Ordered: Current medicines are reviewed at length with the patient today.  Concerns regarding medicines are outlined above.  Orders Placed This Encounter  Procedures   Cardiac Stress Test: Informed Consent Details: Physician/Practitioner Attestation; Transcribe to consent form and obtain patient signature   Exercise Tolerance Test   EKG 12-Lead   ECHOCARDIOGRAM COMPLETE   No orders of the defined types were placed in this encounter.   Patient Instructions  Medication Instructions:  Your physician recommends that you continue on your current medications as directed. Please refer to the Current Medication list given to you today.  *If you need a refill on your cardiac medications before your next appointment, please call your pharmacy*   Lab Work: None If you have labs (blood work)  drawn today and your tests are completely normal, you will receive  your results only by: MyChart Message (if you have MyChart) OR A paper copy in the mail If you have any lab test that is abnormal or we need to change your treatment, we will call you to review the results.   Testing/Procedures: Your physician has requested that you have an exercise tolerance test. For further information please visit [email or web address]. Please also follow instruction sheet, as given.  Your physician has requested that you have an echocardiogram. Echocardiography is a painless test that uses sound waves to create images of your heart. It provides your doctor with information about the size and shape of your heart and how well your hearts chambers and valves are working. This procedure takes approximately one hour. There are no restrictions for this procedure.   Follow-Up: At [facility], you and your health needs are our priority.  As part of our continuing mission to provide you with exceptional heart care, we have created designated Provider Care Teams.  These Care Teams include your primary Cardiologist (physician) and Advanced Practice Providers (APPs -  Physician Assistants and Nurse Practitioners) who all work together to provide you with the care you need, when you need it.  We recommend signing up for the patient portal called "MyChart".  Sign up information is provided on this After Visit Summary.  MyChart is used to connect with patients for Virtual Visits (Telemedicine).  Patients are able to view lab/test results, encounter notes, upcoming appointments, etc.  Non-urgent messages can be sent to your provider as well.   To learn more about what you can do with MyChart, go to [email or web address].    Your next appointment:   1 year(s)  The format for your next appointment:   In Person  Provider:   Thomasene RippleKardie Ethyle Tiedt, DO     Other Instructions  KardiaMobile  [email or web address]        FDA-cleared, clinical grade mobile EKG monitor: Lourena SimmondsKardia is the most clinically-validated mobile EKG used by the world's leading cardiac care medical professionals With Basic service, know instantly if your heart rhythm is normal or if atrial fibrillation is detected, and email the last single EKG recording to yourself or your doctor Premium service, available for purchase through the Kardia app for $9.99 per month or $99 per year, includes unlimited history and storage of your EKG recordings, a monthly EKG summary report to share with your doctor, along with the ability to track your blood pressure, activity and weight Includes one KardiaMobile phone clip FREE SHIPPING: Standard delivery 1-3 business days. Orders placed by 11:00am PST will ship that afternoon. Otherwise, will ship next business day. All orders ship via [employer] Priority Mail from [place], [place]    [employer]ardie Mobile - sending an EKG KeyCorpDownload app and set up profile. Run EKG - by placing 1-2 fingers on the silver plates After EKG is complete - Download PDF  - Skip password (if you apply a password the provider will need it to view the EKG) Click share button (square with upward arrow) in bottom left corner To send: choose MyChart (first time log into MyChart)  Pop up window about sending ECG Click continue Choose type of message Choose provider Type subject and message Click send (EKG should be attached)  - To send additional EKGs in one message click the paperclip image and bottom of page to attach.    Echocardiogram An echocardiogram is a test that uses sound waves (ultrasound) to produce images of the heart. Images from an echocardiogram can provide important information about:  Heart size and shape. The size and thickness and movement of your heart's walls. Heart muscle function and strength. Heart valve function or if you have stenosis. Stenosis is when the heart valves are  too narrow. If blood is flowing backward through the heart valves (regurgitation). A tumor or infectious growth around the heart valves. Areas of heart muscle that are not working well because of poor blood flow or injury from a heart attack. Aneurysm detection. An aneurysm is a weak or damaged part of an artery wall. The wall bulges out from the normal force of blood pumping through the body. Tell a health care provider about: Any allergies you have. All medicines you are taking, including vitamins, herbs, eye drops, creams, and over-the-counter medicines. Any blood disorders you have. Any surgeries you have had. Any medical conditions you have. Whether you are pregnant or may be pregnant. What are the risks? Generally, this is a safe test. However, problems may occur, including an allergic reaction to dye (contrast) that may be used during the test. What happens before the test? No specific preparation is needed. You may eat and drink normally. What happens during the test?  You will take off your clothes from the waist up and put on a hospital gown. Electrodes or electrocardiogram (ECG)patches may be placed on your chest. The electrodes or patches are then connected to a device that monitors your heart rate and rhythm. You will lie down on a table for an ultrasound exam. A gel will be applied to your chest to help sound waves pass through your skin. A handheld device, called a transducer, will be pressed against your chest and moved over your heart. The transducer produces sound waves that travel to your heart and bounce back (or "echo" back) to the transducer. These sound waves will be captured in real-time and changed into images of your heart that can be viewed on a video monitor. The images will be recorded on a computer and reviewed by your health care provider. You may be asked to change positions or hold your breath for a short time. This makes it easier to get different views or  better views of your heart. In some cases, you may receive contrast through an IV in one of your veins. This can improve the quality of the pictures from your heart. The procedure may vary among health care providers and hospitals. What can I expect after the test? You may return to your normal, everyday life, including diet, activities, and medicines, unless your health care provider tells you not to do that. Follow these instructions at home: It is up to you to get the results of your test. Ask your health care provider, or the department that is doing the test, when your results will be ready. Keep all follow-up visits. This is important. Summary An echocardiogram is a test that uses sound waves (ultrasound) to produce images of the heart. Images from an echocardiogram can provide important information about the size and shape of your heart, heart muscle function, heart valve function, and other possible heart problems. You do not need to do anything to prepare before this test. You may eat and drink normally. After the echocardiogram is completed, you may return to your normal, everyday life, unless your health care provider tells you not to do that. This information is not intended to replace advice given to you by your health care provider. Make sure you discuss any questions you have with your health care provider.  Document Revised: 10/16/2020 Document Reviewed: 09/26/2019 Elsevier Patient Education  2022 Elsevier Inc.     Adopting a Healthy Lifestyle.  Know what a healthy weight is for you (roughly BMI <25) and aim to maintain this   Aim for 7+ servings of fruits and vegetables daily   65-80+ fluid ounces of water or unsweet tea for healthy kidneys   Limit to max 1 drink of alcohol per day; avoid smoking/tobacco   Limit animal fats in diet for cholesterol and heart health - choose grass fed whenever available   Avoid highly processed foods, and foods high in saturated/trans  fats   Aim for low stress - take time to unwind and care for your mental health   Aim for 150 min of moderate intensity exercise weekly for heart health, and weights twice weekly for bone health   Aim for 7-9 hours of sleep daily   When it comes to diets, agreement about the perfect plan isnt easy to find, even among the experts. Experts at the [facility] [employer] developed an idea known as the Healthy Eating Plate. Just imagine a plate divided into logical, healthy portions.   The emphasis is on diet quality:   Load up on vegetables and fruits - one-half of your plate: Aim for color and variety, and remember that potatoes dont count.   Go for whole grains - one-quarter of your plate: Whole wheat, barley, wheat berries, quinoa, oats, brown rice, and foods made with them. If you want pasta, go with whole wheat pasta.   Protein power - one-quarter of your plate: Fish, chicken, beans, and nuts are all healthy, versatile protein sources. Limit red meat.   The diet, however, does go beyond the plate, offering a few other suggestions.   Use healthy plant oils, such as olive, canola, soy, corn, sunflower and peanut. Check the labels, and avoid partially hydrogenated oil, which have unhealthy trans fats.   If youre thirsty, drink water. Coffee and tea are good in moderation, but skip sugary drinks and limit milk and dairy products to one or two daily servings.   The type of carbohydrate in the diet is more important than the amount. Some sources of carbohydrates, such as vegetables, fruits, whole grains, and beans-are healthier than others.   Finally, stay active  Signed, Thomasene Ripple, DO  02/19/2021 6:17 PM    [facility]

## 2021-02-20 ENCOUNTER — Telehealth (HOSPITAL_COMMUNITY): Payer: Self-pay | Admitting: *Deleted

## 2021-02-20 NOTE — Telephone Encounter (Signed)
Close encounter 

## 2021-02-21 ENCOUNTER — Ambulatory Visit (HOSPITAL_COMMUNITY)
Admission: RE | Admit: 2021-02-21 | Payer: Self-pay | Source: Ambulatory Visit | Attending: Cardiology | Admitting: Cardiology

## 2021-02-25 ENCOUNTER — Telehealth (HOSPITAL_COMMUNITY): Payer: Self-pay | Admitting: *Deleted

## 2021-02-25 NOTE — Telephone Encounter (Signed)
Close encounter 

## 2021-02-26 ENCOUNTER — Ambulatory Visit (HOSPITAL_COMMUNITY)
Admission: RE | Admit: 2021-02-26 | Discharge: 2021-02-26 | Disposition: A | Discharge status: Home / Self Care | Payer: Self-pay | Source: Ambulatory Visit | Attending: Internal Medicine | Admitting: Internal Medicine

## 2021-02-26 ENCOUNTER — Other Ambulatory Visit: Payer: Self-pay

## 2021-02-26 ENCOUNTER — Ambulatory Visit (INDEPENDENT_AMBULATORY_CARE_PROVIDER_SITE_OTHER): Payer: Self-pay

## 2021-02-26 DIAGNOSIS — Z952 Presence of prosthetic heart valve: Secondary | ICD-10-CM

## 2021-02-26 DIAGNOSIS — R0602 Shortness of breath: Secondary | ICD-10-CM | POA: Insufficient documentation

## 2021-02-26 LAB — ECHOCARDIOGRAM COMPLETE
AR max vel: 1.24 cm2
AV Area VTI: 1.35 cm2
AV Area mean vel: 1.39 cm2
AV Mean grad: 13 mmHg
AV Peak grad: 26.6 mmHg
Ao pk vel: 2.58 m/s
Area-P 1/2: 3.06 cm2
S' Lateral: 2.9 cm

## 2021-02-26 LAB — EXERCISE TOLERANCE TEST
Angina Index: 0
Duke Treadmill Score: 9
Estimated workload: 10.4
Exercise duration (min): 8 min
Exercise duration (sec): 31 s
MPHR: 162 {beats}/min
Peak HR: 146 {beats}/min
Percent HR: 90 %
Rest HR: 78 {beats}/min
ST Depression (mm): 0 mm

## 2021-03-10 ENCOUNTER — Telehealth: Payer: Self-pay

## 2021-03-10 NOTE — Telephone Encounter (Signed)
Called pt to check on the status of his DOT physical. She states he went to [facility] and had the physical done and does not need paperwork to be filled out by Dr. Servando Salina at this time. Pt made aware he can reach out to [place] if that changes. He verbalized understanding. No further issues to  address at this time.

## 2021-03-12 ENCOUNTER — Telehealth: Payer: Self-pay

## 2021-03-12 DIAGNOSIS — Z0000 Encounter for general adult medical examination without abnormal findings: Secondary | ICD-10-CM

## 2021-03-12 NOTE — Telephone Encounter (Signed)
Called patient to determine if he was interested in health coaching for smoking cessation per referral from Dr. Servando Salina. Patient is interested in health coaching and has been scheduled for an initial session over the telephone on 1/27 at 3:45pm. Patient will be called at this time.    Josanne Boerema Nedra Hai, [facility] Guide, Health Coach [address] [place] [address] Telephone: [phone] Email: [email or web address]

## 2021-03-14 ENCOUNTER — Telehealth: Payer: Self-pay

## 2021-03-14 ENCOUNTER — Ambulatory Visit: Payer: Self-pay

## 2021-03-14 DIAGNOSIS — Z0000 Encounter for general adult medical examination without abnormal findings: Secondary | ICD-10-CM

## 2021-03-14 NOTE — Telephone Encounter (Signed)
Called patient to hold health coaching session over the phone. Patient did not answer. Left message for patient to return call to hold session or to reschedule.    Khristin Keleher, [facility] Guide, Health Coach [address]  [place] [address] Telephone: [phone] Email: [email or web address]  

## 2021-03-14 NOTE — Telephone Encounter (Signed)
Patient returned call to reschedule health coaching session. Patient requested to be scheduled on 2/3 at 3:45pm. Patient has been rescheduled and will be called at this time.   Ami Mally Nedra Hai, [facility] Guide, Health Coach [address], [address] [place] [address] Telephone: [phone] Email: [email or web address]

## 2021-03-21 ENCOUNTER — Ambulatory Visit (INDEPENDENT_AMBULATORY_CARE_PROVIDER_SITE_OTHER): Payer: Self-pay

## 2021-03-21 ENCOUNTER — Other Ambulatory Visit: Payer: Self-pay

## 2021-03-21 DIAGNOSIS — Z0000 Encounter for general adult medical examination without abnormal findings: Secondary | ICD-10-CM

## 2021-03-24 NOTE — Progress Notes (Signed)
Appointment Outcome: Completed, Session #: Initial health coaching session Start time: 3:46pm   End time: 4:33pm   Total Mins: 47 minutes  AGREEMENTS SECTION   Overall Goal(s): Smoking cessation                                           Agreement/Action Steps: Aim to smoke no more than one pack of cigarettes by the next two weeks Practice mini quits Extend the time between smoking next cigarette Drink water to curb cravings Conduct self-check-ins Ask self "Why am I smoking?" "Do I need this cigarette right now?" Implement positive self-talk Use squeeze ball/any object to keep your hand busy when not smoking Talk a walk to distract self from smoking   Progress Notes:  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 Patient stated that he smokes more when he is driving or is not busy. Patient shared that he has a cigarette in the morning after waking and after eating.   Patient expressed that he has no real stress that causes him to cope by smoking. Patient mentioned that his smoking is out of habit. Patient has smoked for over 40 years and finds himself lighting a cigarette without thinking about it.   Patient has tried patches before but did smoke while using them. Patient used peppermints to curb cravings but found himself getting addicted to the candy.   Patient stated that he will be moving into a new home and will have more activities to do in the community to help him stay active and deter him from smoking. Patient stated that his reason for quitting smoking is to improve his overall health.     Coaching Outcomes: Patient asked about vaping as an alternative to smoking cigarettes. Educated patient on vaping and did not encourage the use of the vape as an alternative.   Patient decided that he will try the nicotine patches again. Sent a message to Dr. Servando Salina to write a prescription for the patient. Patient mentioned that he will try NicoDerm  in the meantime while waiting for the prescription. Patient was advised not to smoke while using the patch.   Patient is aiming to smoke no more than one pack of cigarettes per day.   Patient will implement the following action steps over the next two weeks.   In the meantime, the patient will practice mini quits before trying the patches. Patient will extend the time between smoking his next cigarette during this time.   Use a squeeze ball to keep his hands busy while watching tv. Patient may try this technique while driving if he gets the urge to smoke.  Patient will take a walk to distract himself from smoking. Patient may walk around in the house if the weather doesn't permit him to walk outside.   Patient will conduct self-check-ins to ask himself questions to determine his behavior when he has lit a cigarette and what step he will implement instead of smoking. Patient will implement positive self-talk to encourage/reassure himself through the process of quitting.    Patient will try drinking water when cravings occur.

## 2021-03-28 IMAGING — DX DG CHEST 1V PORT
1 series · 1 of 1 positions shown · non-contrast
Comparison: Yesterday

CLINICAL DATA: Aortic valve replacement

EXAM:
PORTABLE CHEST 1 VIEW

[chest ap]
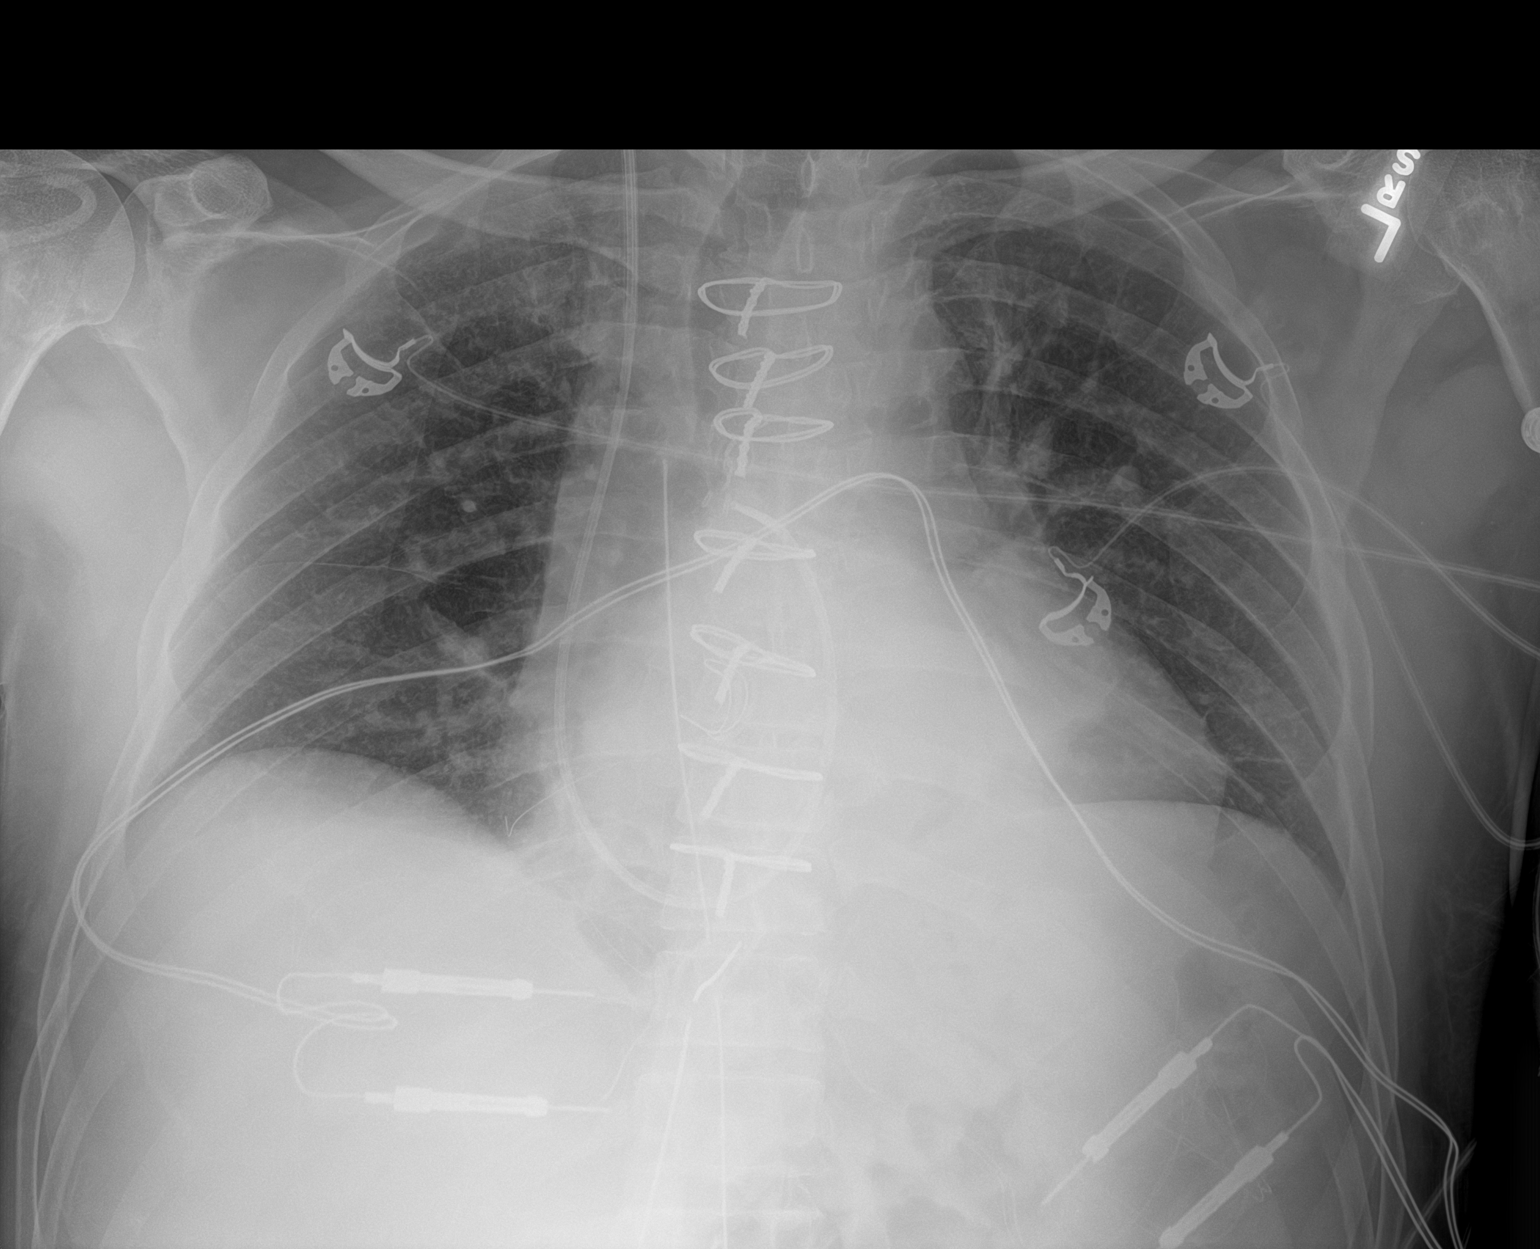

[1 of 1 positions shown; findings below may reference images not displayed]

FINDINGS: Tracheal and esophageal extubation with stable lung volumes. Minimal
atelectasis at the bases. No edema, effusion, or pneumothorax.
Stable postoperative heart size. Stable remaining chest tube
position. Stable Swan-Ganz catheter tip placement at the right main
pulmonary artery.
IMPRESSION: Extubation with stable inflation and no visible pneumothorax.

## 2021-03-29 IMAGING — DX DG CHEST 1V PORT
1 series · 1 of 1 positions shown · non-contrast
Comparison: Chest radiograph from one day prior.

CLINICAL DATA: Status post aortic valve replacement

EXAM:
PORTABLE CHEST 1 VIEW

[chest ap]
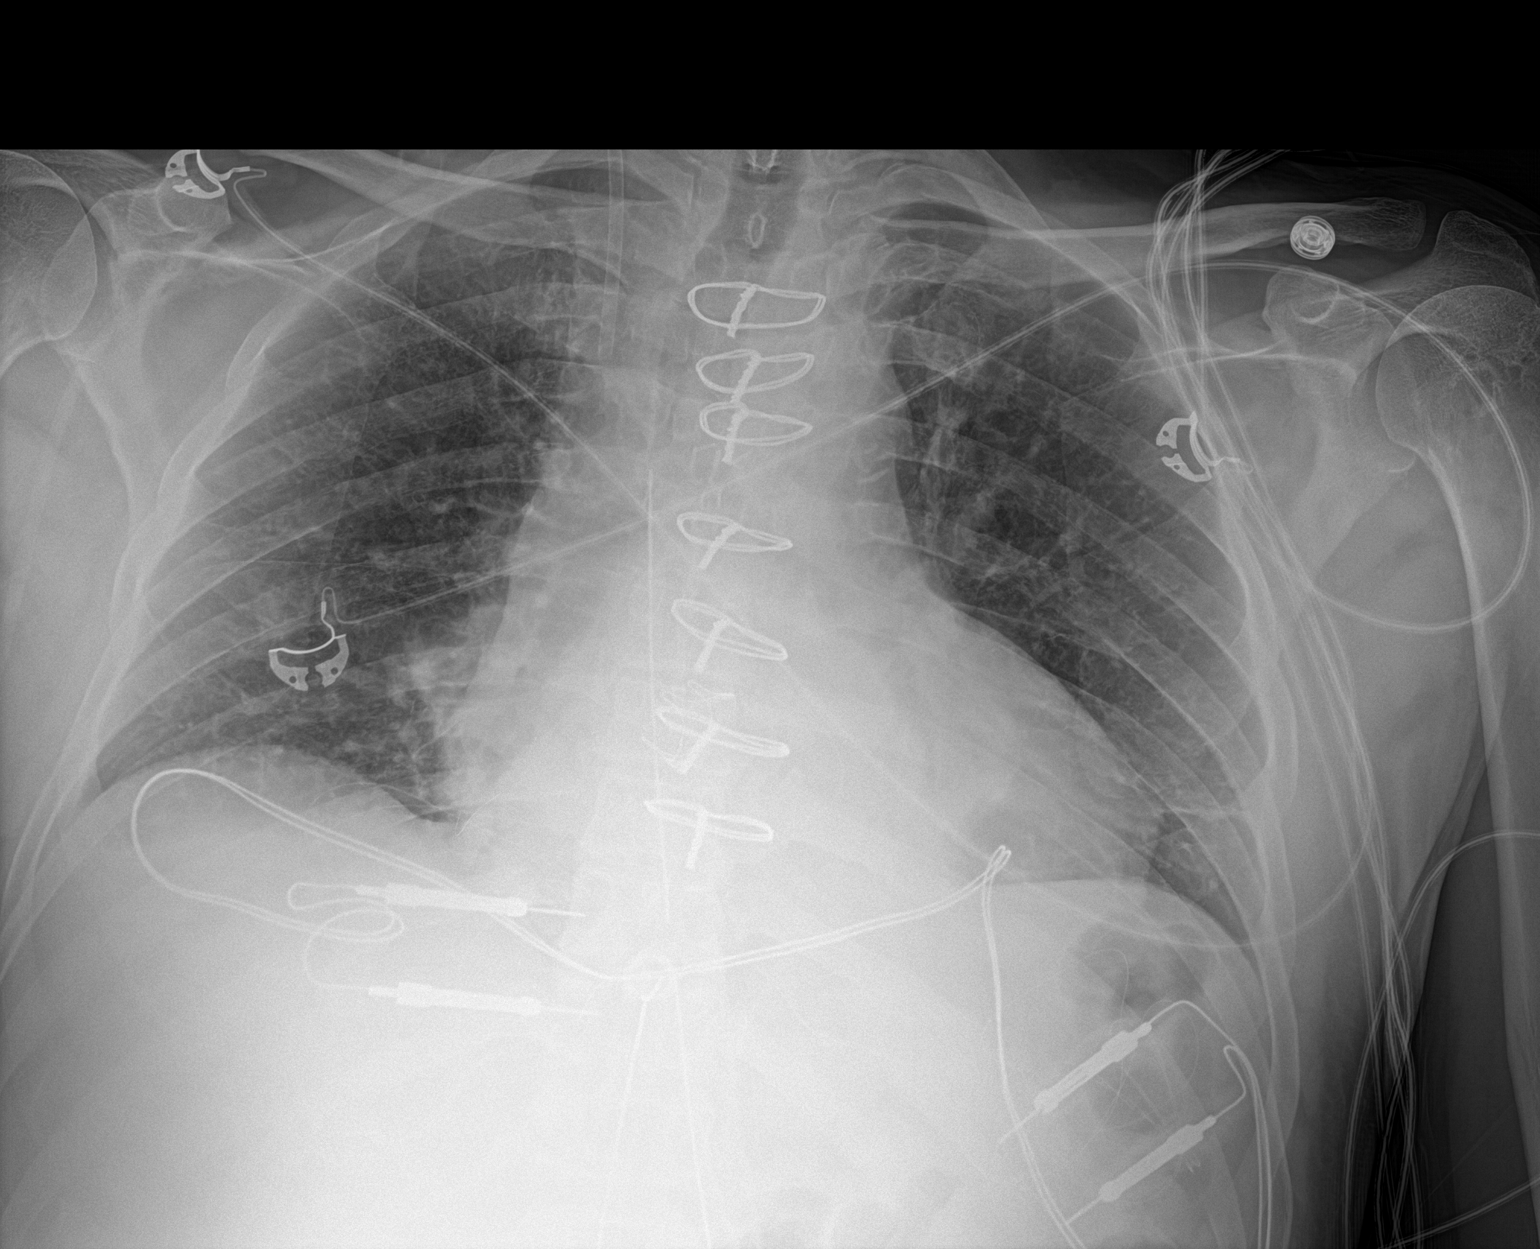

[1 of 1 positions shown; findings below may reference images not displayed]

FINDINGS: Intact sternotomy wires. Aortic valve prosthesis in place. Removal
of Swan-Ganz catheter with right internal jugular central venous
sheath terminating in the right brachiocephalic vein. Stable
cardiomediastinal silhouette with mild cardiomegaly. No
pneumothorax. No pleural effusion. No overt pulmonary edema. Stable
medial bibasilar atelectasis.
IMPRESSION: 1. No pneumothorax.
2. Stable mild cardiomegaly without overt pulmonary edema.
3. Stable medial bibasilar atelectasis.

## 2021-03-30 IMAGING — DX DG CHEST 1V PORT
1 series · 1 of 1 positions shown · non-contrast
Comparison: Radiograph 12/10/2018

CLINICAL DATA: Aortic valve replacement 12/08/2018, history of
pneumonia, aortic stenosis and ascending aortic dilatation

EXAM:
PORTABLE CHEST 1 VIEW

[chest ap]
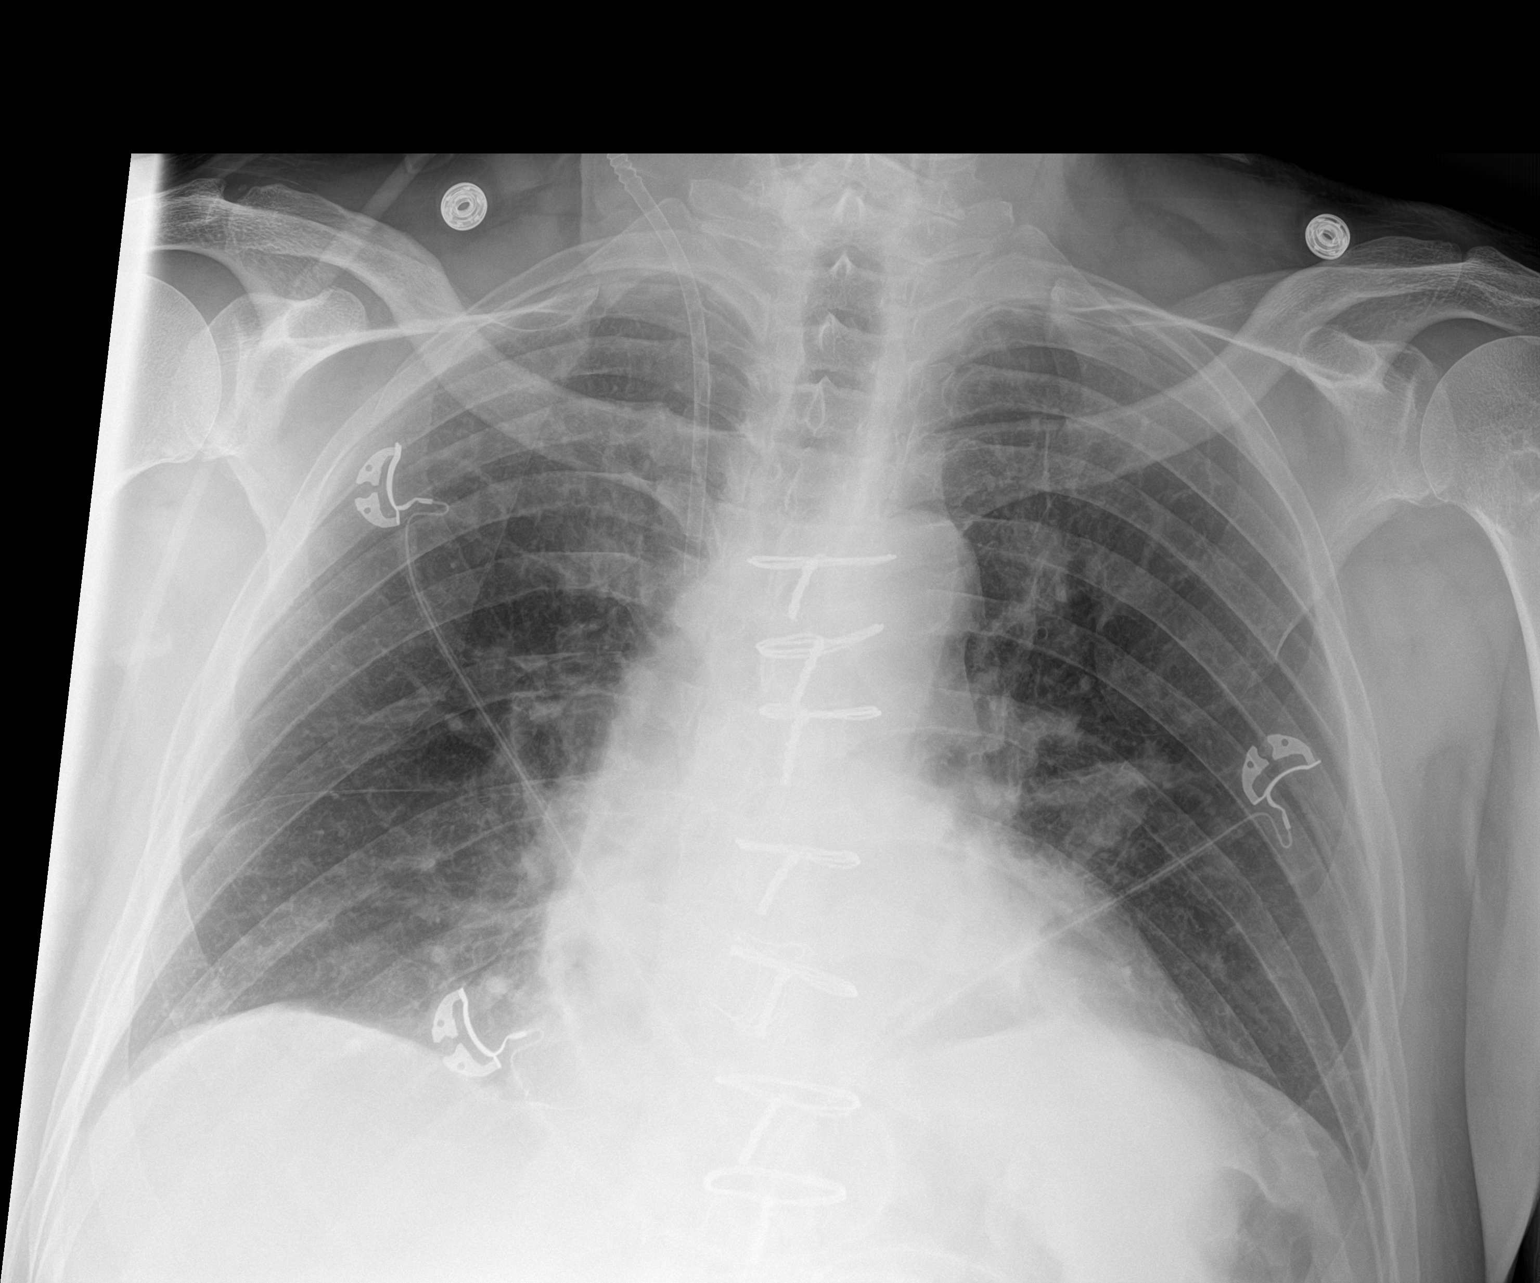

[1 of 1 positions shown; findings below may reference images not displayed]

FINDINGS: Stable post sternotomy changes with unchanged mediastinal contour.
Right IJ catheter sheath terminates in the mid SVC. Interval removal
of the mediastinal drain and epicardial pacer wires seen on
comparison exam. Residual basilar areas of atelectasis and mild
central venous congestion. No pneumothorax. No effusion.
IMPRESSION: 1. Interval removal of the mediastinal drain and epicardial pacer
wires seen on comparison exam.
2. Residual basilar atelectasis and mild central venous congestion.

## 2021-03-31 ENCOUNTER — Other Ambulatory Visit: Payer: Self-pay | Admitting: Cardiology

## 2021-04-01 ENCOUNTER — Other Ambulatory Visit: Payer: Self-pay | Admitting: Cardiology

## 2021-04-01 ENCOUNTER — Telehealth: Payer: Self-pay

## 2021-04-01 NOTE — Telephone Encounter (Signed)
° °  Pre-operative Risk Assessment    Patient Name: Ramone Gander  DOB: [birth year] MRN: [id number]      Request for Surgical Clearance    Procedure:  EGD/COLONSCOPY  Date of Surgery:  Clearance 05/09/21                               [facility]  Surgeon:  Laurell Roof, MD Surgeon's Group or Practice Name:  [facility] Phone number:   Fax number:  [phone]   Type of Clearance Requested:   - Pharmacy:  Hold Aspirin STOP 05-04-21    Type of Anesthesia:   NOT LISTED   Additional requests/questions:

## 2021-04-02 NOTE — Telephone Encounter (Signed)
I will be fine to hold the aspirin for the duration of time which is 5 days prior to his procedure.

## 2021-04-02 NOTE — Telephone Encounter (Addendum)
° °  Name: Cody Hebert  DOB: Nov 06, 1960  MRN: [id number]   Primary Cardiologist: Thomasene Ripple, DO  Chart reviewed as part of pre-operative protocol coverage. Patient was contacted 04/02/2021 in reference to pre-operative risk assessment for pending surgery as outlined below.  Cody Hebert was last seen 02/2021 by Dr. 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 He also had brief intermittent palpitations. Kardia Mobile was recommended. 2D echo was stable and ETT was felt to be normal. No further testing was felt required.   Per Ronie Spies PA-C: I reached out to patient for update on how he is doing. The patient affirms he has been doing well without any new cardiac symptoms. Therefore, based on ACC/AHA guidelines, the patient would be at acceptable risk for the planned procedure without further cardiovascular testing. The patient was advised that if he develops new symptoms prior to surgery to contact our office to arrange for a follow-up visit, and he verbalized understanding.  Dr. Jennye Boroughs requested to stop ASA 05/04/21 for 05/09/21 procedure. Per Dr. Servando Salina, OK to hold ASA as requested and restart after procedure.   Therefore, based on ACC/AHA guidelines, the patient would be at acceptable risk for the planned procedure without further cardiovascular testing.   The patient was advised that if he develops new symptoms prior to surgery to contact our office to arrange for a follow-up visit, and he verbalized understanding.  I will route this recommendation to the requesting party via Epic fax function and remove from  pre-op pool. Please call with questions.  Cody Rutherford Duke, PA 04/03/2021, 8:29 AM

## 2021-04-04 ENCOUNTER — Telehealth: Payer: Self-pay

## 2021-04-04 ENCOUNTER — Other Ambulatory Visit: Payer: Self-pay

## 2021-04-04 ENCOUNTER — Ambulatory Visit: Payer: Self-pay

## 2021-04-04 DIAGNOSIS — Z0000 Encounter for general adult medical examination without abnormal findings: Secondary | ICD-10-CM

## 2021-04-04 MED ORDER — NICOTINE POLACRILEX 4 MG MT GUM: 4.0000 mg | CHEWING_GUM | OROMUCOSAL | 0 refills | Status: DC | PRN

## 2021-04-04 MED ORDER — NICOTINE 21 MG/24HR TD PT24: 21.0000 mg | MEDICATED_PATCH | Freq: Every day | TRANSDERMAL | 4 refills | Status: DC

## 2021-04-04 MED ORDER — NICOTINE 21 MG/24HR TD PT24: 21.0000 mg | MEDICATED_PATCH | Freq: Every day | TRANSDERMAL | 0 refills | Status: DC

## 2021-04-04 NOTE — Telephone Encounter (Signed)
Called patient back to hold health coaching session. Patient did not answer. Left message for patient to call back to reschedule session.   Lido Maske Nedra Hai, [facility] Guide, Health Coach [address] [place] [address] Telephone: [phone] Email: [email or web address]

## 2021-04-04 NOTE — Progress Notes (Signed)
Prescription sent to pharmacy.

## 2021-04-04 NOTE — Telephone Encounter (Signed)
Called patient at scheduled time to hold health coaching session via telephone. Patient forgot about session and requested a call back within 15-20 minutes. Patient's appointment has been rescheduled today from 3:45pm - 4:00pm. Will call the patient again at this time.   Dontreal Miera Nedra Hai, [facility] Guide, Health Coach [address] [place] [address] Telephone: [phone] Email: [email or web address]

## 2021-04-04 NOTE — Addendum Note (Signed)
Addended by: Orvan July on: 04/04/2021 10:40 AM   Modules accepted: Orders

## 2021-04-04 NOTE — Telephone Encounter (Signed)
Patient returned call. Patient's health coaching appointment has been rescheduled to 2/20 at 3:45pm. Patient will be called at this time.   Keasia Dubose Nedra Hai, [facility] Guide, Health Coach [address] [place] [address] Telephone: [phone] Email: [email or web address]

## 2021-04-07 ENCOUNTER — Ambulatory Visit (INDEPENDENT_AMBULATORY_CARE_PROVIDER_SITE_OTHER): Payer: Self-pay

## 2021-04-07 ENCOUNTER — Other Ambulatory Visit: Payer: Self-pay

## 2021-04-07 DIAGNOSIS — Z0000 Encounter for general adult medical examination without abnormal findings: Secondary | ICD-10-CM

## 2021-04-07 NOTE — Progress Notes (Signed)
Appointment Outcome: Completed, Session #: 1 Start time: 3:46pm   End time: 4:06pm Total Mins: 20 minutes  AGREEMENTS SECTION    Overall Goal(s): Smoking cessation                                             Agreement/Action Steps:  Aim to smoke no more than one pack of cigarettes by the next two weeks Practice mini quits Extend the time between smoking next cigarette Drink water to curb cravings Conduct self-check-ins Ask self "Why am I smoking?" "Do I need this cigarette right now?" Implement positive self-talk Use squeeze ball/any object to keep your hand busy when not smoking Take a walk to distract self from smoking   Progress Notes:  Patient stated that he has been able to reduce his smoking to five cigarettes today. Patient mentioned that he was not able to maintain smoking one pack or less per day until now. Patient shared that he became really determined to stop smoking and has leaned into that. Patient did share that he has used a nicotine vape in between smoking when he has had the urge to smoke but does not resort to this strategy each time.   Patient stated that he has conducted self-check-ins to give himself some tough love. Patient has told himself to put down the cigarette, that he does not need the cigarette at this time or remind himself that he is trying to improve his health. Patient stated that using positive self-talk has been helpful in deterring smoking.   Patient mentioned that he has not tried drinking water to curb cravings. Patient has not purchased a squeeze ball yet to use to keep his hands busy to deter smoking. Patient has not been able to engage in walking to manage cravings due to the weather. Patient mentioned that he believes these steps will be helpful as well with quitting smoking.    Indicators of Success and Accountability:  Patient stated that having the mindset change to being determined to quit is his indicator of success and accountability.   Readiness: Patient is in the action phase of smoking cessation. Strengths and Supports: Patient is being supported by friends. Patient is determined to quit.  Challenges and Barriers: Patient does not foresee any challenges/barriers to implementing action steps and starting to implement nicotine replacement therapy with the patches.    Coaching Outcomes: Patient was reminded of the Quit4Life workbook to aid in determining additional steps that he can implement outside of the steps outlined above.   Patient will continue implementing his action steps over the next two weeks until he picks up his nicotine patch prescription sometime this week. Patient is aware that he cannot smoke and use the nicotine patches at the same time and will not remove patches to smoke.  Patient stated that he will purchase a squeeze ball whenever he picks up his prescription.   Patient believes that drinking water, squeezing the ball, and taking walks will be helpful in managing his cravings for smoking.   Attempted: Fulfilled - Patient is practicing mini quits, which has helped him reduced his smoking recently.  Partial - Patient was able to maintain smoking 5 cigarettes one day out of the two weeks but was not able to maintain smoking more than a pack on the other days.  Not met - Patient has not tried drinking water, using  a squeeze ball, or taking a walk to deter smoking and manage cravings.

## 2021-04-08 ENCOUNTER — Telehealth: Payer: Self-pay | Admitting: Licensed Clinical Social Worker

## 2021-04-08 NOTE — Telephone Encounter (Signed)
Noted pt without any PCP or insurance, I have reached out to him via telephone and left voicemail requesting call back. Message left on [phone]. Will re-attempt as able.   Octavio Graves, MSW, LCSW Clinical Social Worker II [facility] Navigation  [phone]- work cell phone (preferred) [phone]- desk phone

## 2021-04-09 ENCOUNTER — Telehealth: Payer: Self-pay | Admitting: Licensed Clinical Social Worker

## 2021-04-09 NOTE — Telephone Encounter (Signed)
Mailed pt my card and applications for [facility] and [employer] financial assistance programs. I will re-attempt to call pt tomorrow as able. Note placed with applications that encourage pt to reach out to me to receive any further assistance.   Cody Hebert, MSW, LCSW Clinical Social Worker II [facility] Navigation  [phone]- work cell phone (preferred) [phone]- desk phone

## 2021-04-10 ENCOUNTER — Telehealth: Payer: Self-pay | Admitting: Licensed Clinical Social Worker

## 2021-04-10 NOTE — Telephone Encounter (Signed)
Additional message left for pt at [phone].  Remain available, will re-attempt as able.   Octavio Graves, MSW, LCSW Clinical Social Worker II [facility] Navigation  [phone]- work cell phone (preferred) [phone]- desk phone

## 2021-04-10 NOTE — Telephone Encounter (Signed)
LCSW received return call from pt. Introduced self, role, reason for call. Pt shares that he is currently at work but does still have current insurance coverage.  He states he just hasn't brought it in to the office at this time. I encouraged him to do so.  Advised him if he receives bills to call the office/billing department and see if they can refile.  Pt has been sent [facility] financial assistance applications if he needs them at any time. Remain available as needed.    Cody Hebert, MSW, LCSW Clinical Social Worker II [facility] Navigation  [phone]- work cell phone (preferred) [phone]- desk phone

## 2021-04-21 ENCOUNTER — Other Ambulatory Visit: Payer: Self-pay

## 2021-04-21 ENCOUNTER — Ambulatory Visit (INDEPENDENT_AMBULATORY_CARE_PROVIDER_SITE_OTHER): Payer: Self-pay

## 2021-04-21 DIAGNOSIS — Z0000 Encounter for general adult medical examination without abnormal findings: Secondary | ICD-10-CM

## 2021-04-21 NOTE — Progress Notes (Signed)
Appointment Outcome: Completed, Session #: 2 ?Start time: 3:46pm   End time: 4:11pm   Total Mins: 25 minutes ? ?AGREEMENTS SECTION ? ? ?  ?Overall Goal(s): ?Smoking cessation                                            ?  ?Agreement/Action Steps:  ?Aim to smoke no more than one pack of cigarettes by the next two weeks ?Practice mini quits ?Extend the time between smoking next cigarette ?Drink water to curb cravings ?Conduct self-check-ins ?Ask self "Why am I smoking?" "Do I need this cigarette right now?" ?Implement positive self-talk ?Use squeeze ball/any object to keep your hand busy when not smoking ?Take a walk to distract self from smoking ? ?Progress Notes:  ?Patient mentioned that he has not been able to get to the pharmacy to pick up the nicotine patches. Patient stated that he has not smoke a cigarette in two weeks after experiencing getting sick from the smell of lighting one. Patient shared that from that moment he had decided that he would not smoke another cigarette. Patient expressed that having experiences with coughing and choking also helped him decide to quit smoking cigarettes. Patient decided to give away his cigarettes and has not purchased any since.  ? ?Patient mentioned that he does use a vape 8-10 times a day. Patient expressed that he only uses it when the cravings are intense. Patient finds himself reaching for a cigarette out of habit but reminds himself that he no longer smokes them. Patient also conducts self-check-ins and implement positive self-talk when he finds himself reaching for the vape. Patient shared that he practices mini quits by telling himself ?Not yet." Patient tries to go as long as he can in between [place]. Patient wants to be nicotine free within a month's time.  ? ?Patient shared that his friends along with conducting a self-check-in he has been encouraged not to smoke cigarettes. Patient feels that he can quit nicotine without the patches. Patient reported that he has  some lozenges at home that he is going to start using.  ? ?Patient is drinking one-quart bottles of water at time. Patient stated that he hasn't noticed if drinking water helps with his cravings or not because he is constantly drinking water. Patient shared that he is walking periodically throughout the day. Patient is interested in increasing his physical activity but must figure out how to incorporate it into his schedule.  ? ?Patient is recognizing that he has more time on his hands since he is not taking smoke breaks. Patient stated that he didn't realize how much time he was spending smoking. Patient shared that work is also a distraction for him not vaping as much and having the support of family, friends, and coworkers that are nonsmokers. Patient expressed that he has really made up his mind not to smoke a cigarette moving forward.  ? ? ?Indicators of Success and Accountability:  Patient has been able to refrain from smoking cigarettes in the past two weeks. ?Readiness: Patient is in the action phase of smoking cessation.  ?Strengths and Supports: Patient is being supported by family, friends, and co-workers. Patient is determined and is exercising willpower to deter smoking cigarettes.  ?Challenges and Barriers: Patient does not foresee any challenges to implementing his action steps over the next two weeks.  ? ?Coaching Outcomes: ?Patient has  set a quit date for vaping for one month from now (May 22, 2021). ? ?Patient will maintain not using the vape more than 8-10 daily. Patient will maintain not smoking.  ? ?Patient will start using lozenges to deter vaping. ? ?Patient stated that he will work on quitting without the nicotine patches since he has been able to get this far without them. Patient has not been able to purchase a squeeze ball so that action step has been removed.  ? ?Patient will implement the following action steps outlined below over the next two weeks. ? ?Overall Goal(s):  ?Smoking  cessation (Quit vaping by May 22, 2021) ? ?Agreement/Action Steps: ?Maintain not smoking cigarettes over the next two weeks. ?Minimize using the vape to 8-10 hits per day ?Practice mini quits for vaping ?Extend the time between smoking next cigarette ?Drink water to curb cravings ?Use lozenges to deter vaping ?Conduct self-check-ins ?Ask self "Why am I smoking?" "Do I need this cigarette right now?" ?Implement positive self-talk ?Talk a walk to distract self from smoking ? ?Attempted: ?Fulfilled - Patient was able to not smoke more than a pack of cigarettes per day, continues to practice mini quits with vaping, drinks water, conduct self-check-ins and implement positive self-talk, and walks occasionally.  ?Not met - Patient has not been able to purchase a squeeze ball.  ? ?

## 2021-05-05 ENCOUNTER — Other Ambulatory Visit: Payer: Self-pay

## 2021-05-05 ENCOUNTER — Ambulatory Visit (INDEPENDENT_AMBULATORY_CARE_PROVIDER_SITE_OTHER): Payer: Self-pay

## 2021-05-05 DIAGNOSIS — Z0000 Encounter for general adult medical examination without abnormal findings: Secondary | ICD-10-CM

## 2021-05-05 NOTE — Progress Notes (Signed)
Appointment Outcome: Completed, Session #: 3 ?Start time: 3:40pm    End time: 4:00pm   Total Mins: 20 minutes ? ?AGREEMENTS SECTION ? ? ? ?Overall Goal(s):  ?Smoking cessation (Quit vaping by May 22, 2021) ?  ?Agreement/Action Steps: ?Maintain not smoking cigarettes over the next two weeks. ?Minimize using the vape to 8-10 hits per day ?Practice mini quits for vaping ?Extend the time between smoking next cigarette ?Drink water to curb cravings ?Use lozenges to deter vaping ?Conduct self-check-ins ?Ask self "Why am I smoking?" "Do I need this cigarette right now?" ?Implement positive self-talk ?Talk a walk to distract self from smoking ? ?Progress Notes:  ?Patient reported that he has been able to maintain not smoking cigarettes over the past two weeks. Patient shared that he is adamant about not going back to smoking because of the health consequences and understanding that he may not be able to quit again if he restarts. Patient's outlook is that if you don't start something, then there is nothing for you to have to quit.  ? ?Patient stated that he did pick up the nicotine patches but realized after the second day that he was having trouble sleeping and discontinued use. Patient has been able to return to sleeping better since stopping the patches. Patient stated that he has purchased the lozenges but has not used them to deter smoking. Patient continues to drink a lot of water due to having a dry mouth. ? ?Patient has been able to minimize vaping to approximately twice a day. Patient is conducting self-check-ins when he has thoughts to use the vape and practices mini quits during those times to extend the period in between vaping. Patient is also implementing positive self-talk to remind himself that he doesn't need to vape at that time. Patient has incorporated not having the vape in his pocket and placed it out of reach to make him more conscious of him reaching for it and giving himself the time to talk his  way out of smoking.  ? ?Patient is working periodically throughout the day during working hours. Patient has been able to incorporate walking more on the weekends around his complex. Patient shared that he has established and enforcing boundaries with other regarding offering him a cigarette. Patient expressed how important it is for him not to go there with smoking cigarettes again after he has been able to quit.  ? ?Patient shared that he is being supported and motivated by his significant other. Patient also was complimented by one of his grandchildren that he didn't smell bad. Patient is also motivated to continue with this journey to remain smoke free and to quit vaping because he wants to be around for his grandchildren. Patient has also noticed that the coughing spells has lessened over time but believes that the congestion is clearing itself out of his lungs.  ? ? ?Indicators of Success and Accountability:  Patient was able to reduce the number of times he vapes below 8-10 times per day and has maintained not smoking cigarettes.  ?Readiness: Patient is in the action phase of smoking cessation.  ?Strengths and Supports: Patient is being supported by family. Patient has been determined to quit nicotine, and his perspective towards smoking has changed. ?Challenges and Barriers: Patient does not foresee any challenges/barriers to implementing his action steps over the next two weeks.  ? ?Coaching Outcomes: ?Patient believes that he is really close to quitting vaping. Patient will aim to vape no more than 2-5 times per day,  while aiming for 0 times per day over the next two weeks.  ? ?Patient will implement the following action steps over the next two weeks as outlined below.  ? ?Agreement/Action Steps: ?Maintain not smoking cigarettes over the next two weeks. ?Minimize using the vape to 2-5 hits per day ?Practice mini quits for vaping ?Extend the time between smoking next cigarette ?Drink water to curb  cravings ?Conduct self-check-ins ?Ask self "Why am I smoking?" "Do I need this cigarette right now?" ?Implement positive self-talk ?Talk a walk to distract self from smoking ? ?Attempted: ?Fulfilled - Patient was able to maintain not smoking cigarettes over the past two weeks. Patient has been able to reduce the use of the vape lower than 8-10 times per day. Patient practices mini quits, drink water, conduct self-check-ins, and implement positive self-talk to deter vaping as frequently. Patient has been able to incorporate walking as well.  ? ? ?Not attempted: ?Dropped/Revised - Patient did not use the lozenges and will continue to work towards quitting the vape without them or the use of the nicotine patches.  ? ?

## 2021-05-19 ENCOUNTER — Ambulatory Visit (INDEPENDENT_AMBULATORY_CARE_PROVIDER_SITE_OTHER): Payer: Self-pay

## 2021-05-19 DIAGNOSIS — Z0000 Encounter for general adult medical examination without abnormal findings: Secondary | ICD-10-CM

## 2021-05-19 NOTE — Progress Notes (Signed)
Appointment Outcome: Completed, Session #: 4 ?Start time: 3:44pm   End time: 4:05pm   Total Mins: 21 minutes ? ?AGREEMENTS SECTION ? ? ?Overall Goal(s):  ?Vaping cessation (Quit vaping by May 22, 2021) ? ? ?Agreement/Action Steps: ?Maintain not smoking cigarettes over the next two weeks. ?Minimize using the vape to 2-5 hits per day ?Practice mini quits for vaping ?Extend the time between smoking next cigarette ?Drink water to curb cravings ?Conduct self-check-ins ?Ask self "Why am I vaping?" "Do I need this vaping right now?" ?Implement positive self-talk ?Take a walk to distract self from vaping ? ? ?Progress Notes:  ?Patient stated that he experienced a high level of stress over the past two weeks due to his job. Patient stated that he was able to reduce his vaping to 7-8 hits per day. Patient stated that there was a time that he had to talk to himself because he was tempted to get a cigarette.  ? ?Patient stated that he had to implement positive self-talk and conduct self-check-ins to avoid vaping more due to the stress. Patient shared that he was able to go 1-2 hours in between vaping, which is longer than normal, helping him to decrease how often he vaped compared to the previous weeks. Patient stated that he has been drinking water but has not noticed much of a difference in his cravings. ? ?Patient mentioned that over the weekend he reflected on his behavior and realized that he needed to refocus and become more determined. Patient stated that he didn't meet the goal of reducing his vaping to 2-5 hits because he got in his head and lost sight of his goal temporarily. Patient spent some time discussing his experience and progress with his significant other for support.  ? ?Patient stated that he found that he had the vape in his pocket a couple of days instead of out of reach like before, which encouraged him to use it without thinking sometimes. Patient shared that he is carrying a work bag with him to work  that he will place the vape in that it will be out of sight and out of mind. Patient also stated that he has contemplated not using the vape in early in the morning and trying to go long as possible without using it. ? ? ? ?Indicators of Success and Accountability:  Patient stated that he was able to maintain not smoking cigarettes under stress or vaping more than the previous two weeks. ?Readiness: Patient is in the action stage of vaping cessation. ?Strengths and Supports: Patient is being supported by his significant other. Patient is determined not to go backwards.  ?Challenges and Barriers: Patient's challenges were stress on the job, which caused him to lose track of his goal. ? ?Coaching Outcomes: ?Patient will implement the following action steps over the next two weeks as outlined below. Patient has changed his quit date for vaping to Jun 16, 2021. Patient will work to reduce his vaping to 2-5 hits per day while extending his vaping time to a minimum of 1-2 hours between time.  ? ? ? ? ?Overall Goal(s):  ?Vaping cessation (Quit vaping by Jun 16, 2021) ? ? ?Agreement/Action Steps: ?Maintain not smoking cigarettes over the next two weeks. ?Minimize using the vape to 2-5 hits per day ?Hide vape in work bag ?Practice mini quits for vaping  ?Extend the time between vaping at least 1-2 hours ?Avoid vaping early mornings and extend vaping in mornings long as possible ?Conduct self-check-ins ?Ask self "  Why am I vaping?" "Do I need to vape right now?" ?Implement positive self-talk to deter vaping ?Take a walk to distract self from vaping ? ? ? ?Attempted: ?Fulfilled - Patient maintained not smoking cigarettes over the past two weeks. Patient continues to practice mini quits with vaping. Patient is drinking water, conducting self-check-ins, and implementing positive self-talk. ?Not met - Patient did not take walks over the past two weeks due to the weather. Patient was not able to minimize his vaping to 2-5 hits per  day.  ? ? ?

## 2021-06-02 ENCOUNTER — Ambulatory Visit (INDEPENDENT_AMBULATORY_CARE_PROVIDER_SITE_OTHER): Payer: Self-pay

## 2021-06-02 DIAGNOSIS — Z0000 Encounter for general adult medical examination without abnormal findings: Secondary | ICD-10-CM

## 2021-06-02 NOTE — Progress Notes (Signed)
Appointment Outcome: Completed, Session #: 5 ?Start time: 3:46pm   End time: 4:07pm   Total Mins: 21 minutes ? ?AGREEMENTS SECTION ? ? ? ?Overall Goal(s):  ?Vaping cessation (Quit vaping by Jun 16, 2021) ?  ?  ?Agreement/Action Steps: ?Maintain not smoking cigarettes over the next two weeks. ?Minimize using the vape to 2-5 hits per day ?Hide vape in work bag ?Practice mini quits for vaping  ?Extend the time between vaping at least 1-2 hours ?Avoid vaping early mornings and extend vaping in mornings long as possible ?Conduct self-check-ins ?Ask self "Why am I vaping?" "Do I need to vape right now?" ?Implement positive self-talk to deter vaping ?Take a walk to distract self from vaping ? ?Progress Notes:  ?Patient has been able to maintain not smoking over the past two weeks. Patient is working on minimizing how often he vapes per day by concentrating on how long he goes without smoking. Patient stated that because his focus is on practicing mini quits, he lost count of how often he has vaped.  ? ?Patient is hiding the vape pen in his work bag and find himself implementing positive self-talk to deter himself from [place]. Patient mentioned that sometimes if he picks up the vape, he will tell himself to wait awhile and throw it in the seat. Patient must wait until he stops before he can access the vape pen, which helps with practicing mini quits.  ? ?Patient stated that he has worked his way to being able to practice mini quits for 4-5 hours at a time. Patient shared that he has been able to extend when he first uses the vape in the morning. Patient reported that sometimes he doesn't use the vape until lunch time. Patient has not walked on the weekends to distract himself from smoking. However, the patient has been spending time outdoors riding his golf cart, which helps him reducing smoking on the weekends. ? ?Patient is decreasing his nicotine intake by practicing mini quits, which may have contributed to his dizzy  spell on Saturday. Patient is reminded of the feelings that he experienced with smoking cigarettes that encouraged him to quit cigarettes. Patient is motivated to try quitting on April 22nd. Patient has told himself that he would no longer touch the vape after this.  ? ? ? ?Indicators of Success and Accountability:  Patient has been able to extend his mini quit times to aid in reducing vaping. ?Readiness: Patient is in the action phase of vaping cessation. ?Strengths and Supports: Patient is being supported by his significant other. Patient is exercising discipline to quit vaping. ?Challenges and Barriers: Patient does not foresee any challenges to implementing his action steps over the next two weeks. ? ? ? ?Coaching Outcomes: ?Patient is going to aim to quit on April 22nd, but officially by May 1st.  ? ?Patient will continue to implement his action steps over the next two weeks as they are outlined above.  ? ?Attempted: ?Fulfilled - Patient has been able to maintain not smoking cigarettes over the past two weeks, hide vape in work bag, practice mini quits by extending time between vaping by 1-2 hours, and avoiding vaping early in mornings, conducting self-check-ins, and implementing positive self-talk. ?Not met - Patient did not walk in the past two weeks.  ?Other - Patient has been keeping track of the time in between vaping and not how many times he has vaped over the course of the day. ? ?

## 2021-06-16 ENCOUNTER — Ambulatory Visit: Payer: Self-pay

## 2021-06-16 ENCOUNTER — Telehealth: Payer: Self-pay

## 2021-06-16 DIAGNOSIS — Z0000 Encounter for general adult medical examination without abnormal findings: Secondary | ICD-10-CM

## 2021-06-16 NOTE — Telephone Encounter (Signed)
Called patient as scheduled for health coaching session. Patient did not answer. Left message for patient to call back to hold session or to reschedule.  ? ?Renaee Munda, [facility] ?[facility] ?Care Guide, Health Coach ?[address] ?[place] [address] ?Telephone: [phone] ?Email: [email or web address] ? ?

## 2021-09-27 ENCOUNTER — Other Ambulatory Visit: Payer: Self-pay | Admitting: Cardiology

## 2022-01-03 ENCOUNTER — Other Ambulatory Visit: Payer: Self-pay | Admitting: Cardiology

## 2022-01-03 ENCOUNTER — Telehealth: Payer: Self-pay | Admitting: Student

## 2022-01-03 MED ORDER — METOPROLOL SUCCINATE ER 25 MG PO TB24: 25.0000 mg | ORAL_TABLET | Freq: Every day | ORAL | 2 refills | Status: DC

## 2022-01-03 NOTE — Telephone Encounter (Signed)
   Patient called Answering Service requesting a refill of his Metoprolol. Reviewed chart and patient takes Toprol-XL 25mg  daily. Will send in a 30 day prescription with 2 refills to requested pharmacy (patient states insurance will not cover 90 day prescription). Patient very thankful for the help.  , PA-C 01/03/2022 3:21 PM

## 2022-01-05 NOTE — Telephone Encounter (Signed)
Refill was sent 01/03/22 by Marjie Skiff

## 2022-03-30 ENCOUNTER — Other Ambulatory Visit: Payer: Self-pay | Admitting: Student

## 2022-04-26 ENCOUNTER — Other Ambulatory Visit: Payer: Self-pay | Admitting: Student

## 2022-05-06 IMAGING — CT CT ANGIO CHEST
2 of 6 series · 13 of 36 positions shown · IV contrast (iopamidol)
Comparison: December 01, 2018.

CLINICAL DATA: Thoracic aortic aneurysm.

EXAM:
CT ANGIOGRAPHY CHEST WITH CONTRAST
TECHNIQUE: Multidetector CT imaging of the chest was performed using the
standard protocol during bolus administration of intravenous
contrast. Multiplanar CT image reconstructions and MIPs were
obtained to evaluate the vascular anatomy.
CONTRAST:  75mL 2HK50A-NPP IOPAMIDOL (2HK50A-NPP) INJECTION 76%

[Series 5: cta thorax 2.00 bv36 s3 axial arterial · axial · arterial · 0.75mm/px · z∈[+1633,+1891]mm · 12 of 153 slices shown]
[im 12/153  lung]
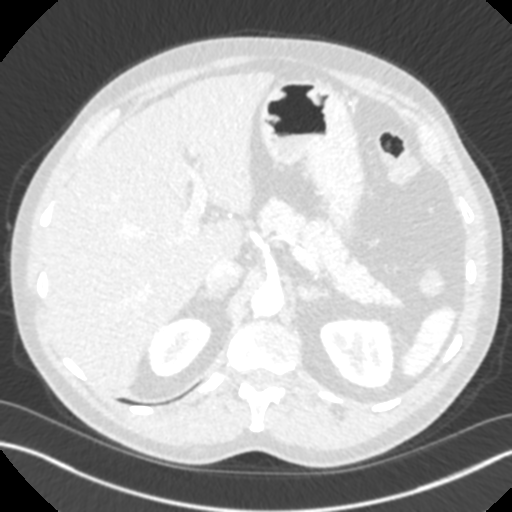
[im 24/153  mediastinal]
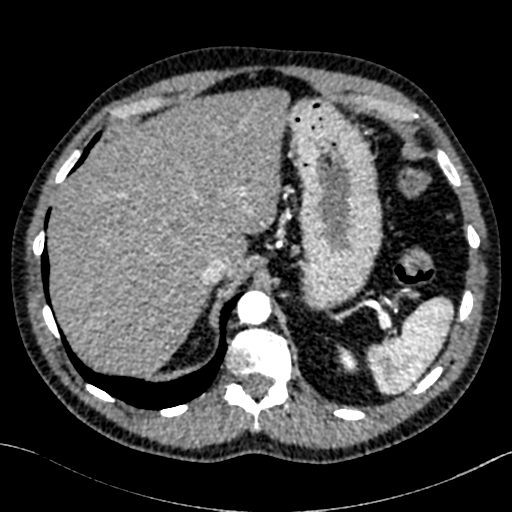
[im 36/153  lung]
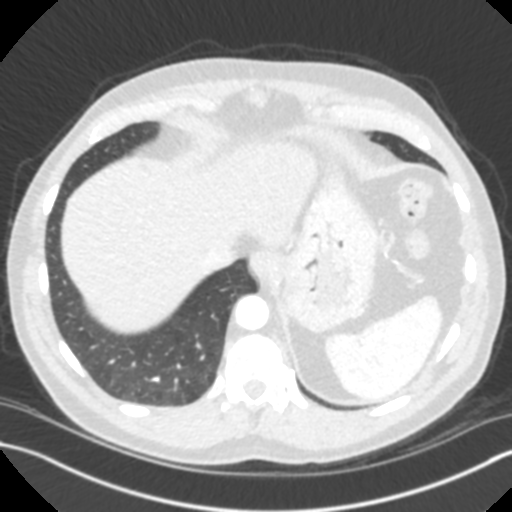
[im 47/153  mediastinal]
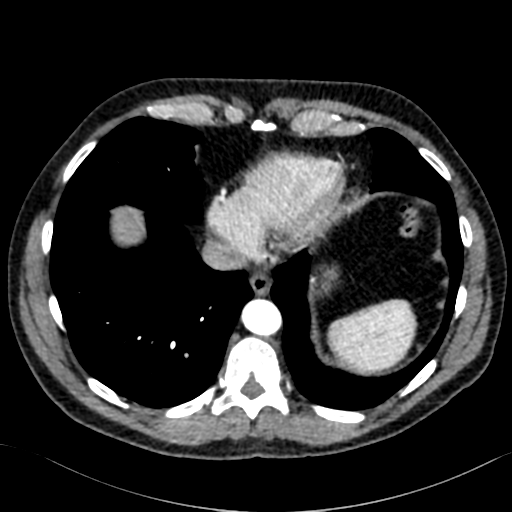
[im 59/153  lung]
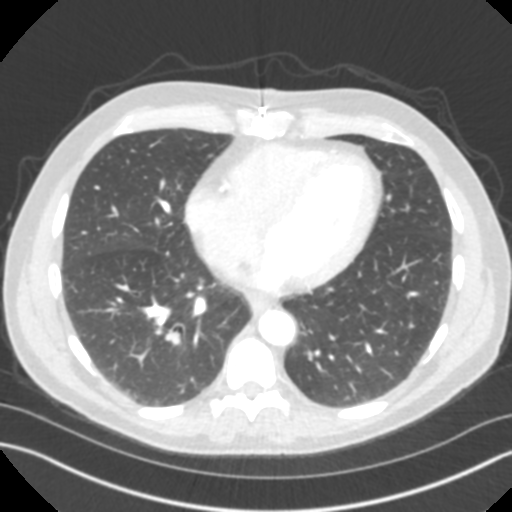
[im 71/153  mediastinal]
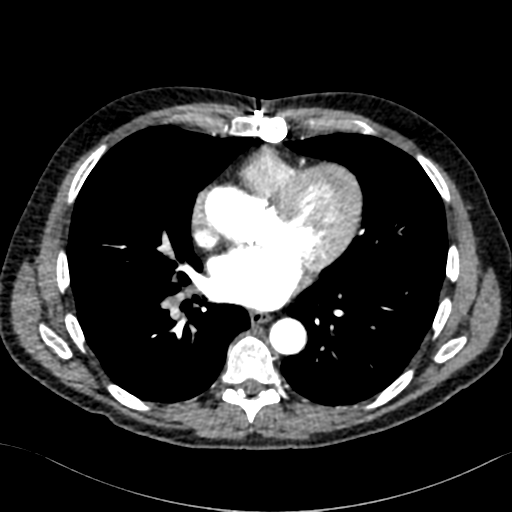
[im 82/153  lung]
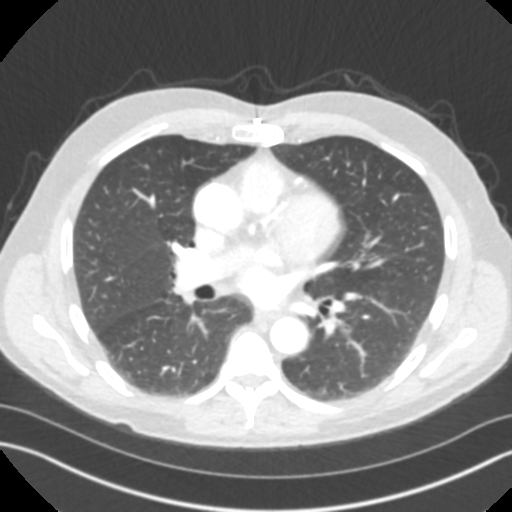
[im 94/153  mediastinal]
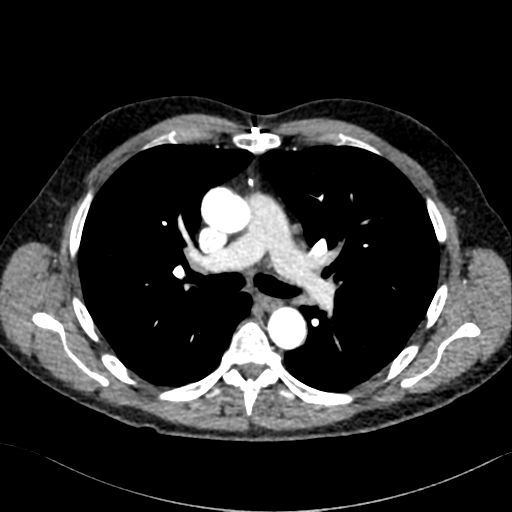
[im 106/153  lung]
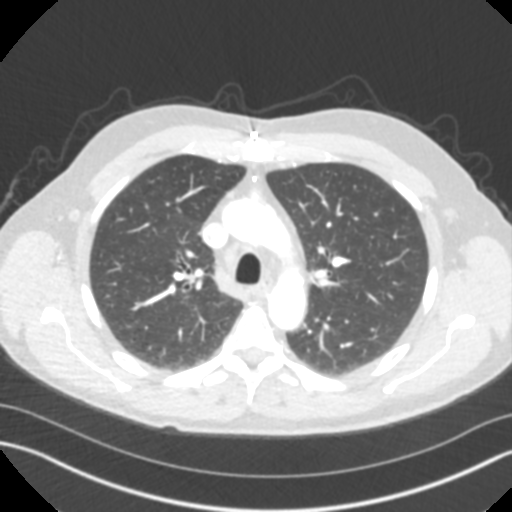
[im 117/153  mediastinal]
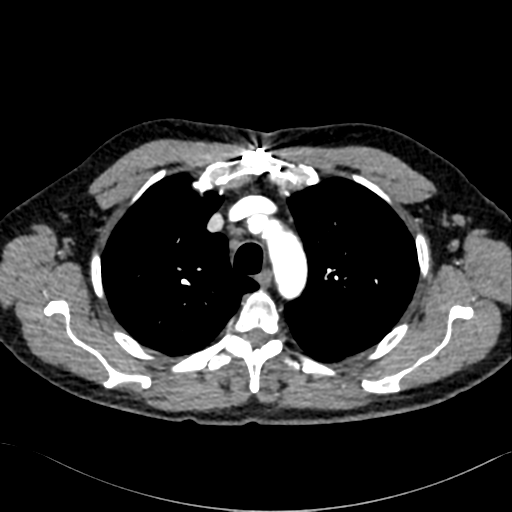
[im 129/153  lung]
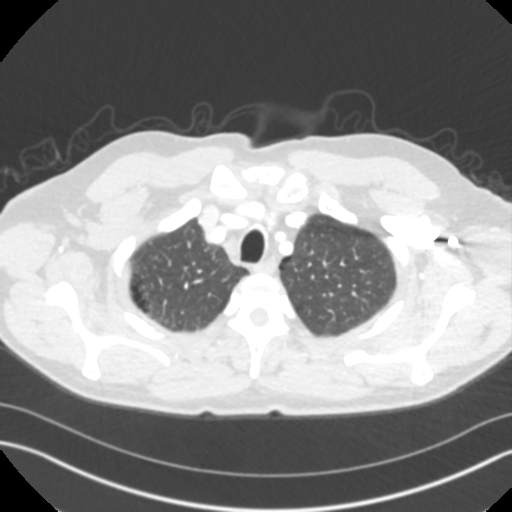
[im 141/153  mediastinal]
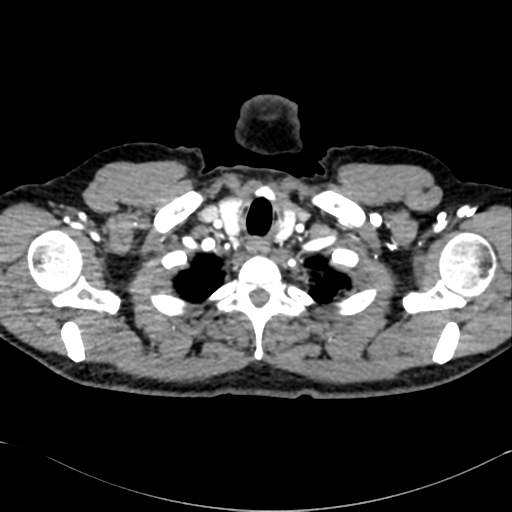

[Series 10: cta thorax 2.00 bv36 s3 cor st · coronal · 0.60mm/px · 1 of 191 slices shown]
[im 96/191  mediastinal]
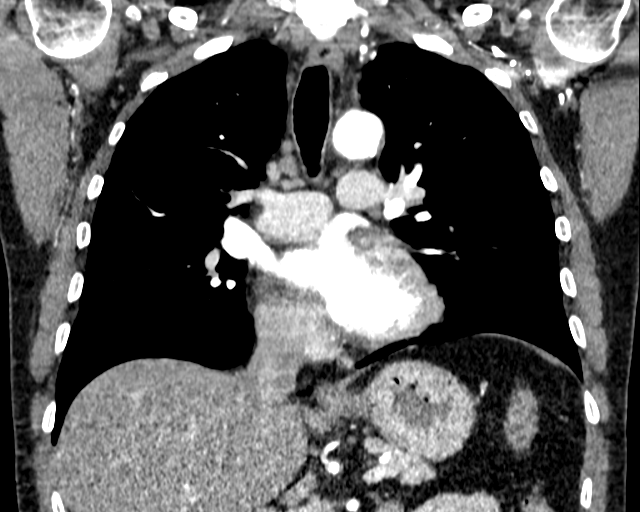

[13 of 36 positions shown; findings below may reference images not displayed]

FINDINGS: Cardiovascular: Status post aortic valve repair as well as repair of
ascending thoracic aortic aneurysm. No aneurysmal dilatation or
dissection is noted currently. Ascending thoracic aorta has maximum
measured diameter of 3.5 cm. Transverse aortic arch measures 2.6 cm.
Proximal descending thoracic aorta measures 2.6 cm. Normal cardiac
size. No pericardial effusion. Great vessels are widely patent.

Mediastinum/Nodes: No enlarged mediastinal, hilar, or axillary lymph
nodes. Thyroid gland, trachea, and esophagus demonstrate no
significant findings.

Lungs/Pleura: Lungs are clear. No pleural effusion or pneumothorax.

Upper Abdomen: No acute abnormality.

Musculoskeletal: No chest wall abnormality. No acute or significant
osseous findings.

Review of the MIP images confirms the above findings.
IMPRESSION: 1. Status post aortic valve repair as well as repair of ascending
thoracic aortic aneurysm. No aneurysmal dilatation or dissection is
noted currently.
2. No other abnormality seen in the chest.

## 2022-05-09 ENCOUNTER — Other Ambulatory Visit: Payer: Self-pay | Admitting: Cardiology

## 2022-06-08 ENCOUNTER — Other Ambulatory Visit: Payer: Self-pay | Admitting: Cardiology

## 2022-07-17 ENCOUNTER — Ambulatory Visit: Payer: Managed Care, Other (non HMO) | Attending: Cardiology | Admitting: Cardiology

## 2022-07-17 ENCOUNTER — Encounter: Payer: Self-pay | Admitting: Cardiology

## 2022-07-17 VITALS — BP 126/80 | HR 53 | Ht 69.0 in | Wt 182.0 lb

## 2022-07-17 DIAGNOSIS — Z952 Presence of prosthetic heart valve: Secondary | ICD-10-CM | POA: Diagnosis not present

## 2022-07-17 DIAGNOSIS — Q231 Congenital insufficiency of aortic valve: Secondary | ICD-10-CM | POA: Diagnosis not present

## 2022-07-17 DIAGNOSIS — Z95828 Presence of other vascular implants and grafts: Secondary | ICD-10-CM

## 2022-07-17 DIAGNOSIS — I480 Paroxysmal atrial fibrillation: Secondary | ICD-10-CM | POA: Diagnosis not present

## 2022-07-17 DIAGNOSIS — I10 Essential (primary) hypertension: Secondary | ICD-10-CM | POA: Diagnosis not present

## 2022-07-17 DIAGNOSIS — E782 Mixed hyperlipidemia: Secondary | ICD-10-CM

## 2022-07-17 DIAGNOSIS — I77810 Thoracic aortic ectasia: Secondary | ICD-10-CM

## 2022-07-17 NOTE — Progress Notes (Unsigned)
Cardiology Office Note:    Date:  07/18/2022   ID:  Cody Hebert, DOB Sep 12, 1960, MRN [id number]  PCP:  Cody Fusi, MD  Cardiologist:  Cody Balsam, MD  Electrophysiologist:  None   Referring MD: No ref. provider found   " I am ok"  History of Present Illness:    Cody Hebert is a 62 y.o. male with a hx of of hyperlipidemia, history of bicuspid aortic valve, Severe aortic stenosis AVR with a 23 mm Edwards INSPIRIS RESILIA  and suparcoronary replacement of his ascending aorta with a 28mm Hemasheild graft  due to acute dissection in the operating room on 12/08/2018 and postop atrial fibrillation now off amiodarone.   I saw the patient in September 2021 at that time he appears to be doing well from a cardiovascular standpoint.  He had not had any recurrence of atrial fibrillation.  We recommend a 20-month follow-up.   I saw the patient on February 17, 2020 at that time he appeared to be doing well from a cardiovascular standpoint.  He had lost his wife but he was doing okay.  He was seen on February 19, 2021 at that time He was experiencing shortness of breath we repeated his echocardiogram.  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  He offers no complaints at this time.  He recently moved further out due to distance and may be difficult to come to the [place] office.  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   S/P AVR (aortic valve replacement) and aortoplasty 12/08/2018   Severe aortic valve stenosis     Past Surgical History:  Procedure Laterality Date   ANTERIOR  CRUCIATE LIGAMENT REPAIR     AORTIC VALVE REPLACEMENT N/A 12/08/2018   Procedure: AORTIC VALVE REPLACEMENT (AVR);  Surgeon: Alleen Borne, MD;  Location: [facility] OR;  Service: Open Heart Surgery;  Laterality: N/A;   EYE SURGERY     REPLACEMENT ASCENDING AORTA N/A 12/08/2018   Procedure: Replacement Ascending Aorta SUPRACORONARY;  Surgeon: Alleen Borne, MD;  Location: [facility] OR;  Service: Open Heart Surgery;  Laterality: N/A;   RIGHT/LEFT HEART CATH AND CORONARY ANGIOGRAPHY N/A 11/22/2018   Procedure: RIGHT/LEFT HEART CATH AND CORONARY ANGIOGRAPHY;  Surgeon: Corky Crafts, MD;  Location: [facility] INVASIVE CV LAB;  Service: Cardiovascular;  Laterality: N/A;   TEE WITHOUT CARDIOVERSION N/A 12/08/2018   Procedure: TRANSESOPHAGEAL ECHOCARDIOGRAM (TEE);  Surgeon: Alleen Borne, MD;  Location: [facility] OR;  Service: Open Heart Surgery;  Laterality: N/A;   VASECTOMY      Current Medications: Current Meds  Medication Sig   aspirin EC 81 MG tablet Take 1 tablet (81 mg total) by mouth daily.   atorvastatin (LIPITOR) 40 MG tablet Take 40 mg by mouth daily.   Coenzyme Q10 (COQ10) 100 MG CAPS Take 100 mg by mouth at bedtime.   loratadine (CLARITIN) 10 MG tablet Take 10 mg by mouth daily.   Magnesium 250 MG TABS Take 250 mg by mouth in the morning and at bedtime.   metoprolol succinate (TOPROL-XL) 25 MG 24 hr tablet  TAKE 1 TABLET (25 MG TOTAL) BY MOUTH DAILY. SCHEDULE AN APPOINTMENT FOR FURTHER REFILLS, 1ST ATTEMPT   Milk Thistle 200 MG CAPS Take by mouth daily.   Multiple Vitamin (MULTIVITAMIN) capsule Take 1 capsule by mouth daily.   naproxen sodium (ALEVE) 220 MG tablet Take 440 mg by mouth 2 (two) times daily as needed (pain.).   nitroGLYCERIN (NITROSTAT) 0.4 MG SL tablet PLACE 1 TABLET (0.4 MG TOTAL) UNDER THE TONGUE EVERY 5 (FIVE) MINUTES AS NEEDED FOR CHEST PAIN.   omeprazole (PRILOSEC) 20 MG capsule Take 20 mg by mouth daily.   OVER THE COUNTER MEDICATION daily. Beet Root     Allergies:   Doxycycline    Social History   Socioeconomic History   Marital status: Married    Spouse name: Not on file   Number of children: Not on file   Years of education: Not on file   Highest education level: Not on file  Occupational History   Not on file  Tobacco Use   Smoking status: Every Day    Packs/day: 1.50    Years: 40.00    Additional pack years: 0.00    Total pack years: 60.00    Types: Cigarettes   Smokeless tobacco: Never  Vaping Use   Vaping Use: Former  Substance and Sexual Activity   Alcohol use: Yes    Alcohol/week: 14.0 standard drinks of alcohol    Types: 14 Cans of beer per week   Drug use: Never   Sexual activity: Not on file  Other Topics Concern   Not on file  Social History Narrative   Not on file   Social Determinants of Health   Financial Resource Strain: Not on file  Food Insecurity: Not on file  Transportation Needs: Not on file  Physical Activity: Not on file  Stress: Not on file  Social Connections: Not on file     Family History: The patient's family history includes Cancer in his father; Congestive Heart Failure in his maternal grandmother; Diabetes in his brother.  ROS:   Review of Systems  Constitution: Negative for decreased appetite, fever and weight gain.  HENT: Negative for congestion, ear discharge, hoarse voice and sore throat.   Eyes: Negative for discharge, redness, vision loss in right eye and visual halos.  Cardiovascular: Negative for chest pain, dyspnea on exertion, leg swelling, orthopnea and palpitations.  Respiratory: Negative for cough, hemoptysis, shortness of breath and snoring.   Endocrine: Negative for heat intolerance and polyphagia.  Hematologic/Lymphatic: Negative for bleeding problem. Does not bruise/bleed easily.  Skin: Negative for flushing, nail changes, rash and suspicious lesions.  Musculoskeletal: Negative for arthritis, joint pain, muscle cramps, myalgias, neck pain and stiffness.  Gastrointestinal: Negative for  abdominal pain, bowel incontinence, diarrhea and excessive appetite.  Genitourinary: Negative for decreased libido, genital sores and incomplete emptying.  Neurological: Negative for brief paralysis, focal weakness, headaches and loss of balance.  Psychiatric/Behavioral: Negative for altered mental status, depression and suicidal ideas.  Allergic/Immunologic: Negative for HIV exposure and persistent infections.    EKGs/Labs/Other Studies Reviewed:    The following studies were reviewed today:   EKG:  The ekg ordered today demonstrates sinus bradycardia, heart rate 53 bpm with underlying left bundle branch block.  Recent Labs: No results found for requested labs within last 365 days.  Recent Lipid Panel    Component Value Date/Time   CHOL 213 (H) 11/07/2018 0901   TRIG 111 11/07/2018 0901   HDL 45 11/07/2018 0901   CHOLHDL  4.7 11/07/2018 0901   LDLCALC 148 (H) 11/07/2018 0901    Physical Exam:    VS:  BP 126/80   Pulse (!) 53   Ht 5\' 9"  (1.753 m)   Wt 182 lb (82.6 kg)   SpO2 97%   BMI 26.88 kg/m     Wt Readings from Last 3 Encounters:  07/17/22 182 lb (82.6 kg)  02/19/21 189 lb 3.2 oz (85.8 kg)  02/27/20 202 lb (91.6 kg)     GEN: Well nourished, well developed in no acute distress HEENT: Normal NECK: No JVD; No carotid bruits LYMPHATICS: No lymphadenopathy CARDIAC: S1S2 noted,RRR, no murmurs, rubs, gallops RESPIRATORY:  Clear to auscultation without rales, wheezing or rhonchi  ABDOMEN: Soft, non-tender, non-distended, +bowel sounds, no guarding. EXTREMITIES: No edema, No cyanosis, no clubbing MUSCULOSKELETAL:  No deformity  SKIN: Warm and dry NEUROLOGIC:  Alert and oriented x 3, non-focal PSYCHIATRIC:  Normal affect, good insight  ASSESSMENT:    1. S/P AVR (aortic valve replacement) and aortoplasty   2. Bicuspid aortic valve   3. Post operative atrial fibrillation   4. Essential hypertension   5. Ascending aorta dilatation (HCC)   6. Mixed hyperlipidemia    7. S/P ascending aortic replacement    PLAN:    Will get an echocardiogram to assess his bioprosthetic valve.  Noted will need antibiotic prophylaxis for selective procedure.  Blood pressure is acceptable, continue with current antihypertensive regimen.  Reviewed his lipid profile from February with his PCP showed total cholesterol 233, HDL 54, LDL 156, triglyceride 130.  He tells me that he is Lipitor was increased to 40 mg daily.  Due to distance he will follow-up with our [place] office with Dr. Bing Matter.   Hyperlipidemia The patient is in agreement with the above plan. The patient left the office in stable condition.  The patient will follow up in   Medication Adjustments/Labs and Tests Ordered: Current medicines are reviewed at length with the patient today.  Concerns regarding medicines are outlined above.  Orders Placed This Encounter  Procedures   EKG 12-Lead   ECHOCARDIOGRAM COMPLETE   No orders of the defined types were placed in this encounter.   Patient Instructions  Medication Instructions:  Your physician recommends that you continue on your current medications as directed. Please refer to the Current Medication list given to you today.  *If you need a refill on your cardiac medications before your next appointment, please call your pharmacy*   Lab Work: None   Testing/Procedures: Your physician has requested that you have an echocardiogram in [place]. Echocardiography is a painless test that uses sound waves to create images of your heart. It provides your doctor with information about the size and shape of your heart and how well your heart's chambers and valves are working. This procedure takes approximately one hour. There are no restrictions for this procedure. Please do NOT wear cologne, perfume, aftershave, or lotions (deodorant is allowed). Please arrive 15 minutes prior to your appointment time.    Follow-Up: At [facility], you and  your health needs are our priority.  As part of our continuing mission to provide you with exceptional heart care, we have created designated Provider Care Teams.  These Care Teams include your primary Cardiologist (physician) and Advanced Practice Providers (APPs -  Physician Assistants and Nurse Practitioners) who all work together to provide you with the care you need, when you need it.   Your next appointment:   1 year(s)  Provider:  Gypsy Balsam, MD    Adopting a Healthy Lifestyle.  Know what a healthy weight is for you (roughly BMI <25) and aim to maintain this   Aim for 7+ servings of fruits and vegetables daily   65-80+ fluid ounces of water or unsweet tea for healthy kidneys   Limit to max 1 drink of alcohol per day; avoid smoking/tobacco   Limit animal fats in diet for cholesterol and heart health - choose grass fed whenever available   Avoid highly processed foods, and foods high in saturated/trans fats   Aim for low stress - take time to unwind and care for your mental health   Aim for 150 min of moderate intensity exercise weekly for heart health, and weights twice weekly for bone health   Aim for 7-9 hours of sleep daily   When it comes to diets, agreement about the perfect plan isnt easy to find, even among the experts. Experts at the [facility] of [employer] developed an idea known as the Healthy Eating Plate. Just imagine a plate divided into logical, healthy portions.   The emphasis is on diet quality:   Load up on vegetables and fruits - one-half of your plate: Aim for color and variety, and remember that potatoes dont count.   Go for whole grains - one-quarter of your plate: Whole wheat, barley, wheat berries, quinoa, oats, brown rice, and foods made with them. If you want pasta, go with whole wheat pasta.   Protein power - one-quarter of your plate: Fish, chicken, beans, and nuts are all healthy, versatile protein sources. Limit red meat.    The diet, however, does go beyond the plate, offering a few other suggestions.   Use healthy plant oils, such as olive, canola, soy, corn, sunflower and peanut. Check the labels, and avoid partially hydrogenated oil, which have unhealthy trans fats.   If youre thirsty, drink water. Coffee and tea are good in moderation, but skip sugary drinks and limit milk and dairy products to one or two daily servings.   The type of carbohydrate in the diet is more important than the amount. Some sources of carbohydrates, such as vegetables, fruits, whole grains, and beans-are healthier than others.   Finally, stay active  Signed, Thomasene Ripple, DO  07/18/2022 9:38 PM    [facility]

## 2022-07-17 NOTE — Patient Instructions (Signed)
Medication Instructions:  Your physician recommends that you continue on your current medications as directed. Please refer to the Current Medication list given to you today.  *If you need a refill on your cardiac medications before your next appointment, please call your pharmacy*   Lab Work: None   Testing/Procedures: Your physician has requested that you have an echocardiogram in [place]. Echocardiography is a painless test that uses sound waves to create images of your heart. It provides your doctor with information about the size and shape of your heart and how well your heart's chambers and valves are working. This procedure takes approximately one hour. There are no restrictions for this procedure. Please do NOT wear cologne, perfume, aftershave, or lotions (deodorant is allowed). Please arrive 15 minutes prior to your appointment time.    Follow-Up: At [facility], you and your health needs are our priority.  As part of our continuing mission to provide you with exceptional heart care, we have created designated Provider Care Teams.  These Care Teams include your primary Cardiologist (physician) and Advanced Practice Providers (APPs -  Physician Assistants and Nurse Practitioners) who all work together to provide you with the care you need, when you need it.   Your next appointment:   1 year(s)  Provider:   Gypsy Balsam, MD

## 2022-08-12 ENCOUNTER — Ambulatory Visit: Payer: Managed Care, Other (non HMO) | Attending: Cardiology

## 2022-08-12 DIAGNOSIS — Z952 Presence of prosthetic heart valve: Secondary | ICD-10-CM | POA: Diagnosis not present

## 2022-08-12 LAB — ECHOCARDIOGRAM COMPLETE
AR max vel: 1.64 cm2
AV Area VTI: 1.7 cm2
AV Area mean vel: 1.64 cm2
AV Mean grad: 7.3 mmHg
AV Peak grad: 13.5 mmHg
Ao pk vel: 1.84 m/s
Calc EF: 53.6 %
S' Lateral: 3.7 cm
Single Plane A2C EF: 51.9 %
Single Plane A4C EF: 55.4 %

## 2022-11-29 ENCOUNTER — Other Ambulatory Visit: Payer: Self-pay | Admitting: Cardiology

## 2023-07-15 ENCOUNTER — Other Ambulatory Visit: Payer: Self-pay

## 2023-07-16 ENCOUNTER — Ambulatory Visit: Attending: Cardiology | Admitting: Cardiology

## 2023-07-16 ENCOUNTER — Encounter: Payer: Self-pay | Admitting: Cardiology

## 2023-07-16 ENCOUNTER — Telehealth: Payer: Self-pay

## 2023-07-16 VITALS — BP 120/74 | HR 68 | Ht 69.0 in | Wt 188.8 lb

## 2023-07-16 DIAGNOSIS — R0683 Snoring: Secondary | ICD-10-CM

## 2023-07-16 DIAGNOSIS — Z952 Presence of prosthetic heart valve: Secondary | ICD-10-CM

## 2023-07-16 DIAGNOSIS — R5383 Other fatigue: Secondary | ICD-10-CM

## 2023-07-16 DIAGNOSIS — I480 Paroxysmal atrial fibrillation: Secondary | ICD-10-CM

## 2023-07-16 DIAGNOSIS — E559 Vitamin D deficiency, unspecified: Secondary | ICD-10-CM

## 2023-07-16 DIAGNOSIS — I10 Essential (primary) hypertension: Secondary | ICD-10-CM

## 2023-07-16 DIAGNOSIS — Z95828 Presence of other vascular implants and grafts: Secondary | ICD-10-CM

## 2023-07-16 DIAGNOSIS — E782 Mixed hyperlipidemia: Secondary | ICD-10-CM | POA: Diagnosis not present

## 2023-07-16 NOTE — Progress Notes (Signed)
 Cardiology Office Note:    Date:  07/16/2023   ID:  Cody Hebert, DOB [birth year], MRN [id number]  PCP:  Adrian Hopper, MD  Cardiologist:  Ralene Burger, MD    Referring MD: Adrian Hopper, MD   No chief complaint on file.   History of Present Illness:    Cody Hebert is a 63 y.o. male past medical history significant for bicuspid aortic valve severe arctic stenosis he required aortic valve replacement with 23 mm Edwards Inspira's Resilia valve with supra coronary replacement of the ascending aorta with a 28 mm Hemi shield graft due to acute dissection in the operating room and that happened on 12/08/2018..  Surgery was complicated with episode of atrial fibrillation and was on amiodarone  but that has been discontinued.  Additional problem includes smoking which is still ongoing, dyslipidemia with some noncompliance with medications.  Comes today to my office for follow-up overall doing well but complaining of having weakness and fatigue.  No chest pain tightness squeezing pressure burning chest simply lack of energy he is not able to do as much as he was able to do before  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   S/P AVR (aortic valve replacement) and aortoplasty 12/08/2018   Severe aortic valve stenosis     Past Surgical History:  Procedure Laterality Date   ANTERIOR CRUCIATE LIGAMENT REPAIR     AORTIC VALVE REPLACEMENT N/A 12/08/2018   Procedure: AORTIC VALVE REPLACEMENT (AVR);  Surgeon: Bartley Lightning, MD;  Location: [facility] OR;  Service: Open Heart Surgery;  Laterality: N/A;   EYE  SURGERY     REPLACEMENT ASCENDING AORTA N/A 12/08/2018   Procedure: Replacement Ascending Aorta SUPRACORONARY;  Surgeon: Bartley Lightning, MD;  Location: [facility] OR;  Service: Open Heart Surgery;  Laterality: N/A;   RIGHT/LEFT HEART CATH AND CORONARY ANGIOGRAPHY N/A 11/22/2018   Procedure: RIGHT/LEFT HEART CATH AND CORONARY ANGIOGRAPHY;  Surgeon: Lucendia Rusk, MD;  Location[facility] INVASIVE CV LAB;  Service: Cardiovascular;  Laterality: N/A;   TEE WITHOUT CARDIOVERSION N/A 12/08/2018   Procedure: TRANSESOPHAGEAL ECHOCARDIOGRAM (TEE);  Surgeon: Bartley Lightning, MD;  Location: [facility] OR;  Service: Open Heart Surgery;  Laterality: N/A;   VASECTOMY      Current Medications: Current Meds  Medication Sig   aspirin  EC 81 MG tablet Take 1 tablet (81 mg total) by mouth daily.   atorvastatin  (LIPITOR) 40 MG tablet Take 40 mg by mouth daily.   Coenzyme Q10 (COQ10) 100 MG CAPS Take 100 mg by mouth at bedtime.   loratadine (CLARITIN) 10 MG tablet Take 10 mg by mouth daily.   Magnesium  250 MG TABS Take 250 mg by mouth in the morning and at bedtime.   metoprolol  succinate (TOPROL -XL) 25 MG 24 hr tablet Take 1 tablet (25 mg total) by mouth daily.   Milk Thistle 200 MG CAPS Take 200 mg by mouth daily.   Multiple Vitamin (MULTIVITAMIN) capsule Take 1 capsule by mouth daily.   naproxen sodium (ALEVE) 220 MG tablet Take 440 mg by mouth 2 (two) times daily as needed (pain.).  nitroGLYCERIN  (NITROSTAT ) 0.4 MG SL tablet PLACE 1 TABLET (0.4 MG TOTAL) UNDER THE TONGUE EVERY 5 (FIVE) MINUTES AS NEEDED FOR CHEST PAIN.   omeprazole (PRILOSEC) 20 MG capsule Take 20 mg by mouth daily.   OVER THE COUNTER MEDICATION Take 1 tablet by mouth daily. Beet Root     Allergies:   Doxycycline   Social History   Socioeconomic History   Marital status: Married    Spouse name: Not on file   Number of children: Not on file   Years of education: Not on file   Highest education level: Not on file  Occupational History   Not on  file  Tobacco Use   Smoking status: Every Day    Current packs/day: 1.50    Average packs/day: 1.5 packs/day for 40.0 years (60.0 ttl pk-yrs)    Types: Cigarettes   Smokeless tobacco: Never  Vaping Use   Vaping status: Former  Substance and Sexual Activity   Alcohol use: Yes    Alcohol/week: 14.0 standard drinks of alcohol    Types: 14 Cans of beer per week   Drug use: Never   Sexual activity: Not on file  Other Topics Concern   Not on file  Social History Narrative   Not on file   Social Drivers of Health   Financial Resource Strain: Not on file  Food Insecurity: Not on file  Transportation Needs: Not on file  Physical Activity: Not on file  Stress: Not on file  Social Connections: Not on file     Family History: The patient's family history includes Cancer in his father; Congestive Heart Failure in his maternal grandmother; Diabetes in his brother. ROS:   Please see the history of present illness.    All 14 point review of systems negative except as described per history of present illness  EKGs/Labs/Other Studies Reviewed:    EKG Interpretation Date/Time:  Friday Jul 16 2023 08:12:44 EDT Ventricular Rate:  68 PR Interval:  166 QRS Duration:  126 QT Interval:  412 QTC Calculation: 438 R Axis:   -60  Text Interpretation: Sinus rhythm with occasional Premature ventricular complexes Left axis deviation Left ventricular hypertrophy with QRS widening and repolarization abnormality Abnormal ECG When compared with ECG of 10-Dec-2018 13:44, Sinus rhythm has replaced Wide QRS rhythm Confirmed by Ralene Burger [id number]) on 07/16/2023 8:30:28 AM    Recent Labs: No results found for requested labs within last 365 days.  Recent Lipid Panel    Component Value Date/Time   CHOL 213 (H) 11/07/2018 0901   TRIG 111 11/07/2018 0901   HDL 45 11/07/2018 0901   CHOLHDL 4.7 11/07/2018 0901   LDLCALC 148 (H) 11/07/2018 0901    Physical Exam:    VS:  BP 120/74   Pulse 68    Ht 5\' 9"  (1.753 m)   Wt 188 lb 12.8 oz (85.6 kg)   SpO2 97%   BMI 27.88 kg/m     Wt Readings from Last 3 Encounters:  07/16/23 188 lb 12.8 oz (85.6 kg)  07/17/22 182 lb (82.6 kg)  02/19/21 189 lb 3.2 oz (85.8 kg)     GEN:  Well nourished, well developed in no acute distress HEENT: Normal NECK: No JVD; No carotid bruits LYMPHATICS: No lymphadenopathy CARDIAC: RRR, systolic ejection murmur grade 1/6 to 2/6 percent right upper portion of the sternum, no rubs, no gallops RESPIRATORY:  Clear to auscultation without rales, wheezing or rhonchi  ABDOMEN: Soft, non-tender, non-distended MUSCULOSKELETAL:  No edema; No deformity  SKIN: Warm and dry LOWER EXTREMITIES: no swelling NEUROLOGIC:  Alert and oriented x 3 PSYCHIATRIC:  Normal affect   ASSESSMENT:    1. Mixed hyperlipidemia   2. S/P AVR (aortic valve replacement) and aortoplasty   3. S/P ascending aortic replacement   4. Post operative atrial fibrillation    PLAN:    In order of problems listed above:  Mixed dyslipidemia I did review K PN which show me LDL 156 HDL 54.  He does not have significant obstructive known coronary artery disease but he continues to smoke so clearly he got risk factors need to be modified.  He does have prescription for Lipitor but he admits that he forgets most more than half of the time at the evening time.  I ask him to start taking Lipitor with all other medications in the morning 6 weeks from now we will recheck his fasting lipid profile. Status post aortic valve replacement complaining of weakness fatigue will repeat echocardiogram atrial valve is functioning properly. Status post ascending aortic replacement.  CT angio of his chest will be done to make sure repair is stable and our argument for doing CT of his chest is the fact that he have history of long-term smoking we will look at his lungs as well. He described to have excessive daytime somnolence on top of that he wakes up sometimes in the  middle of the night with dry mouth we are investigating profound fatigue medicine tiredness which could be related to obstructive sleep apnea.  Will do a sleep study.   Medication Adjustments/Labs and Tests Ordered: Current medicines are reviewed at length with the patient today.  Concerns regarding medicines are outlined above.  Orders Placed This Encounter  Procedures   EKG 12-Lead   Medication changes: No orders of the defined types were placed in this encounter.   Signed, Manfred Seed, MD, [facility] 07/16/2023 8:48 AM    [facility]

## 2023-07-16 NOTE — Telephone Encounter (Signed)
 Patient agreement reviewed and signed on 07/16/2023.  WatchPAT issued to patient on 07/16/2023 by Remus Carmine, RN. Patient aware to not open the WatchPAT box until contacted with the activation PIN. Patient profile initialized in CloudPAT on 07/16/2023 by Shawnee Dellen, RN. Device serial number: [id number]  Please list Reason for Call as Advice Only and type "WatchPAT issued to patient" in the comment box.

## 2023-07-16 NOTE — Patient Instructions (Signed)
 Medication Instructions:  Your physician recommends that you continue on your current medications as directed. Please refer to the Current Medication list given to you today.  *If you need a refill on your cardiac medications before your next appointment, please call your pharmacy*   Lab Work: Your physician recommends that you return for lab work in: 6 weeks You need to have labs done when you are fasting.  You can come Monday through Friday 8:30 am to 12:00 pm and 1:15 to 4:30. You do not need to make an appointment as the order has already been placed. The labs you are going to have done are CMP, TSH, B12, Vit D3 and Lipids.    Testing/Procedures: Your physician has requested that you have an echocardiogram. Echocardiography is a painless test that uses sound waves to create images of your heart. It provides your doctor with information about the size and shape of your heart and how well your heart's chambers and valves are working. This procedure takes approximately one hour. There are no restrictions for this procedure. Please do NOT wear cologne, perfume, aftershave, or lotions (deodorant is allowed). Please arrive 15 minutes prior to your appointment time.  Please note: We ask at that you not bring children with you during ultrasound (echo/ vascular) testing. Due to room size and safety concerns, children are not allowed in the ultrasound rooms during exams. Our front office staff cannot provide observation of children in our lobby area while testing is being conducted. An adult accompanying a patient to their appointment will only be allowed in the ultrasound room at the discretion of the ultrasound technician under special circumstances. We apologize for any inconvenience.   Non-Cardiac CT Angiography (CTA), is a special type of CT scan that uses a computer to produce multi-dimensional views of major blood vessels throughout the body. In CT angiography, a contrast material is injected  through an IV to help visualize the blood vessels  [facility]- They will call when you start    Follow-Up: At [facility], you and your health needs are our priority.  As part of our continuing mission to provide you with exceptional heart care, we have created designated Provider Care Teams.  These Care Teams include your primary Cardiologist (physician) and Advanced Practice Providers (APPs -  Physician Assistants and Nurse Practitioners) who all work together to provide you with the care you need, when you need it.  We recommend signing up for the patient portal called "MyChart".  Sign up information is provided on this After Visit Summary.  MyChart is used to connect with patients for Virtual Visits (Telemedicine).  Patients are able to view lab/test results, encounter notes, upcoming appointments, etc.  Non-urgent messages can be sent to your provider as well.   To learn more about what you can do with MyChart, go to [email or web address].    Your next appointment:   6 month(s)  The format for your next appointment:   In Person  Provider:   Ralene Burger, MD    Other Instructions NA

## 2023-07-19 NOTE — Telephone Encounter (Signed)
 Ordering provider: DR Gordan Latina Associated diagnoses: R06.83 WatchPAT PA obtained on 07/19/2023 by Gaylene Kays, CMA. Authorization: No; tracking ID [id number] REQ Patient notified of PIN (1234) on 07/19/2023 via Notification Method: phone.

## 2023-07-22 ENCOUNTER — Telehealth: Payer: Self-pay | Admitting: Cardiology

## 2023-07-22 NOTE — Telephone Encounter (Signed)
 Caller Abe Abed) stated she will need to get authorization for patient to have CT ANGIO CHEST AORTA W/CM test or test will need to be rescheduled.

## 2023-07-23 ENCOUNTER — Ambulatory Visit (HOSPITAL_BASED_OUTPATIENT_CLINIC_OR_DEPARTMENT_OTHER)
Admission: RE | Admit: 2023-07-23 | Discharge: 2023-07-23 | Disposition: A | Discharge status: Home / Self Care | Source: Ambulatory Visit | Attending: Cardiology | Admitting: Cardiology

## 2023-07-23 DIAGNOSIS — Z952 Presence of prosthetic heart valve: Secondary | ICD-10-CM | POA: Diagnosis not present

## 2023-07-23 MED ORDER — IOHEXOL 350 MG/ML SOLN
100.0000 mL | Freq: Once | INTRAVENOUS | Status: AC | PRN
  Administered 2023-07-23: 100 mL via INTRAVENOUS

## 2023-07-26 ENCOUNTER — Ambulatory Visit: Payer: Self-pay | Admitting: Cardiology

## 2023-08-04 ENCOUNTER — Encounter (INDEPENDENT_AMBULATORY_CARE_PROVIDER_SITE_OTHER): Payer: Self-pay | Admitting: Cardiology

## 2023-08-04 DIAGNOSIS — G4733 Obstructive sleep apnea (adult) (pediatric): Secondary | ICD-10-CM

## 2023-08-10 ENCOUNTER — Ambulatory Visit: Attending: Cardiology

## 2023-08-10 DIAGNOSIS — R0683 Snoring: Secondary | ICD-10-CM

## 2023-08-10 NOTE — Procedures (Signed)
   SLEEP STUDY REPORT Patient Information Study Date: 08/04/2023 Patient Name: Cody Hebert Patient ID: [id number] Birth Date: 12-Jul-1960 Age: 63 Gender: Male BMI: 27.8 (W=187 lb, H=5' 9'') Referring Physician: Lamar Fitch. MD  TEST DESCRIPTION: Home sleep apnea testing was completed using the WatchPat, a Type 1 device, utilizing  peripheral arterial tonometry (PAT), chest movement, actigraphy, pulse oximetry, pulse rate, body position and snore.  AHI was calculated with apnea and hypopnea using valid sleep time as the denominator. RDI includes apneas,  hypopneas, and RERAs. The data acquired and the scoring of sleep and all associated events were performed in  accordance with the recommended standards and specifications as outlined in the AASM Manual for the Scoring of  Sleep and Associated Events 2.2.0 (2015).   FINDINGS:   1. Mild Obstructive Sleep Apnea with AHI 10.2/hr.   2. No Central Sleep Apnea with pAHIc 0.4/hr.   3. Oxygen desaturations as low as 75%.   4. Moderate to severe snoring was present. O2 sats were < 88% for 0.1 min.   5. Total sleep time was 5 hrs and 46 min.   6. 14.7% of total sleep time was spent in REM sleep.   7. sleep onset latency at 20 min.   8. REM sleep onset latency at 205 min.   9. Total awakenings were 18.  10. Arrhythmia detection: None  DIAGNOSIS: Mild Obstructive Sleep Apnea (G47.33)  RECOMMENDATIONS: 1. Clinical correlation of these findings is necessary. The decision to treat obstructive sleep apnea (OSA) is usually  based on the presence of apnea symptoms or the presence of associated medical conditions such as Hypertension,  Congestive Heart Failure, Atrial Fibrillation or Obesity. The most common symptoms of OSA are snoring, gasping for  breath while sleeping, daytime sleepiness and fatigue.  2. Initiating apnea therapy is recommended given the presence of symptoms and/or associated conditions.  Recommend proceeding with one of the  following:  a. Auto-CPAP therapy with a pressure range of 5-20cm H2O.  b. An oral appliance (OA) that can be obtained from certain dentists with expertise in sleep medicine. These are  primarily of use in non-obese patients with mild and moderate disease.  c. An ENT consultation which may be useful to look for specific causes of obstruction and possible treatment  options.  d. If patient is intolerant to PAP therapy, consider referral to ENT for evaluation for hypoglossal nerve stimulator.  3. Close follow-up is necessary to ensure success with CPAP or oral appliance therapy for maximum benefit . 4. A follow-up oximetry study on CPAP is recommended to assess the adequacy of therapy and determine the need  for supplemental oxygen or the potential need for Bi-level therapy. An arterial blood gas to determine the adequacy of  baseline ventilation and oxygenation should also be considered. 5. Healthy sleep recommendations include: adequate nightly sleep (normal 7-9 hrs/night), avoidance of caffeine after  noon and alcohol near bedtime, and maintaining a sleep environment that is cool, dark and quiet. 6. Weight loss for overweight patients is recommended. Even modest amounts of weight loss can significantly  improve the severity of sleep apnea. 7. Snoring recommendations include: weight loss where appropriate, side sleeping, and avoidance of alcohol before  bed. 8. Operation of motor vehicle should be avoided when sleepy.  Signature: Wilbert Bihari, MD; [facility]; Diplomat, American Board of Sleep  Medicine Electronically Signed: 08/10/2023 4:00:47 PM

## 2023-08-11 ENCOUNTER — Telehealth: Payer: Self-pay

## 2023-08-11 NOTE — Telephone Encounter (Unsigned)
 Notified patient of sleep study resuls and recomednations. Patietn stated he does not want to start therapy

## 2023-08-11 NOTE — Telephone Encounter (Signed)
-----   Message from Wilbert Bihari sent at 08/10/2023  4:01 PM EDT ----- Please let patient know that they have sleep apnea and recommend treating with CPAP.  Please order an auto CPAP from 4-15cm H2O with heated humidity and mask of choice.  Order overnight pulse ox on CPAP.  Followup with me in 6 weeks.

## 2023-08-17 ENCOUNTER — Other Ambulatory Visit

## 2023-09-10 ENCOUNTER — Ambulatory Visit: Attending: Cardiology

## 2023-09-10 DIAGNOSIS — Z952 Presence of prosthetic heart valve: Secondary | ICD-10-CM

## 2023-09-11 LAB — B12 AND FOLATE PANEL
Folate: 5.6 ng/mL (ref >3.0)
Vitamin B-12: 912 pg/mL (ref 232–1245)

## 2023-09-11 LAB — COMPREHENSIVE METABOLIC PANEL WITH GFR
ALT: 28 IU/L (ref 0–44)
AST: 29 IU/L (ref 0–40)
Albumin: 4.6 g/dL (ref 3.8–4.9)
Alkaline Phosphatase: 76 IU/L (ref 44–121)
BUN/Creatinine Ratio: 12 (ref 10–24)
BUN: 10 mg/dL (ref 8–27)
Bilirubin Total: 0.5 mg/dL (ref 0.0–1.2)
CO2: 21 mmol/L (ref 20–29)
Calcium: 10.1 mg/dL (ref 8.6–10.2)
Chloride: 99 mmol/L (ref 96–106)
Creatinine, Ser: 0.85 mg/dL (ref 0.76–1.27)
Globulin, Total: 2.5 g/dL (ref 1.5–4.5)
Glucose: 84 mg/dL (ref 70–99)
Potassium: 4.6 mmol/L (ref 3.5–5.2)
Sodium: 138 mmol/L (ref 134–144)
Total Protein: 7.1 g/dL (ref 6.0–8.5)
eGFR: 99 mL/min/1.73 (ref >59)

## 2023-09-11 LAB — TSH: TSH: 1.35 u[IU]/mL (ref 0.450–4.500)

## 2023-09-11 LAB — LIPID PANEL
Chol/HDL Ratio: 4 ratio (ref 0.0–5.0)
Cholesterol, Total: 179 mg/dL (ref 100–199)
HDL: 45 mg/dL (ref >39)
LDL Chol Calc (NIH): 93 mg/dL (ref 0–99)
Triglycerides: 242 mg/dL — ABNORMAL HIGH (ref 0–149)
VLDL Cholesterol Cal: 41 mg/dL — ABNORMAL HIGH (ref 5–40)

## 2023-09-11 LAB — VITAMIN D 25 HYDROXY (VIT D DEFICIENCY, FRACTURES): Vit D, 25-Hydroxy: 29.9 ng/mL — ABNORMAL LOW (ref 30.0–100.0)

## 2023-09-12 LAB — ECHOCARDIOGRAM COMPLETE
AR max vel: 2.03 cm2
AV Area VTI: 2.2 cm2
AV Area mean vel: 2.13 cm2
AV Mean grad: 8.2 mmHg
AV Peak grad: 17.3 mmHg
Ao pk vel: 2.08 m/s
Area-P 1/2: 3.53 cm2
S' Lateral: 3 cm

## 2023-09-13 NOTE — Progress Notes (Unsigned)
 Sleep Medicine CONSULT Note    Date:  09/14/2023   ID:  Cody Hebert, DOB 20-Dec-1960, MRN [id number]  PCP:  Keren Vicenta BRAVO, MD  Cardiologist: Lamar Fitch, MD   Chief Complaint  Patient presents with   New Patient (Initial Visit)    Obstructive sleep apnea    History of Present Illness:  Cody Hebert is a 63 y.o. male who is being seen today for the evaluation of OSA at the request of Lamar Fitch, MD.  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  He was seen by Dr. Brigitte in May 2025 and complained of excessive daytime sleepiness and wakes up in the middle of the night with dry mouth. Epworth Sleepiness score 21. He underwent HST showing Mild OSA with an AHI of 10.2/hr with normal sleep onset latency and prolonged REM sleep latency and O2 nadir of 75%.  An auto CPAP was recommended but he wanted to see a Sleep specialist first.  He is now referred for Sleep Medicine consult for treatment of OSA.  He has been told that he snores but has not been told that he stops breathing by anyone.  He has not had any episodes of waking up gasping for breath or feeling like he is suffocating.  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  He naps on the weekends.   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   S/P AVR (aortic valve replacement) and aortoplasty 12/08/2018   Severe aortic valve stenosis     Past Surgical History:  Procedure Laterality  Date   ANTERIOR CRUCIATE LIGAMENT REPAIR     AORTIC VALVE REPLACEMENT N/A 12/08/2018   Procedure: AORTIC VALVE REPLACEMENT (AVR);  Surgeon: Lucas Dorise POUR, MD;  Location: [facility] OR;  Service: Open Heart Surgery;  Laterality: N/A;   EYE SURGERY     REPLACEMENT ASCENDING AORTA N/A 12/08/2018   Procedure: Replacement Ascending Aorta SUPRACORONARY;  Surgeon: Lucas Dorise POUR, MD;  Location: [facility] OR;  Service: Open Heart Surgery;  Laterality: N/A;   RIGHT/LEFT HEART CATH AND CORONARY ANGIOGRAPHY N/A 11/22/2018   Procedure: RIGHT/LEFT HEART CATH AND CORONARY ANGIOGRAPHY;  Surgeon: Dann Candyce RAMAN, MD;  Location: [facility] INVASIVE CV LAB;  Service: Cardiovascular;  Laterality: N/A;   TEE WITHOUT CARDIOVERSION N/A 12/08/2018   Procedure: TRANSESOPHAGEAL ECHOCARDIOGRAM (TEE);  Surgeon: Lucas Dorise POUR, MD;  Location: [facility] OR;  Service: Open Heart Surgery;  Laterality: N/A;   VASECTOMY      Current Medications: Current Meds  Medication Sig   aspirin  EC 81 MG tablet Take 1 tablet (81 mg total) by mouth daily.   atorvastatin  (LIPITOR) 40 MG tablet Take 40 mg by mouth daily.   Coenzyme Q10 (COQ10) 100 MG CAPS Take 100 mg by mouth at bedtime.   loratadine (CLARITIN) 10 MG tablet Take 10 mg by mouth daily.   Magnesium  250 MG TABS Take 250  mg by mouth in the morning and at bedtime.   metoprolol  succinate (TOPROL -XL) 25 MG 24 hr tablet Take 1 tablet (25 mg total) by mouth daily.   Milk Thistle 200 MG CAPS Take 200 mg by mouth daily.   Multiple Vitamin (MULTIVITAMIN) capsule Take 1 capsule by mouth daily.   naproxen sodium (ALEVE) 220 MG tablet Take 440 mg by mouth 2 (two) times daily as needed (pain.).   nitroGLYCERIN  (NITROSTAT ) 0.4 MG SL tablet PLACE 1 TABLET (0.4 MG TOTAL) UNDER THE TONGUE EVERY 5 (FIVE) MINUTES AS NEEDED FOR CHEST PAIN.   omeprazole (PRILOSEC) 20 MG capsule Take 20 mg by mouth daily.   OVER THE COUNTER MEDICATION Take 1 tablet by mouth daily. Beet Root    Allergies:   Doxycycline   Social  History   Socioeconomic History   Marital status: Married    Spouse name: Not on file   Number of children: Not on file   Years of education: Not on file   Highest education level: Not on file  Occupational History   Not on file  Tobacco Use   Smoking status: Every Day    Current packs/day: 1.50    Average packs/day: 1.5 packs/day for 40.0 years (60.0 ttl pk-yrs)    Types: Cigarettes   Smokeless tobacco: Never  Vaping Use   Vaping status: Former  Substance and Sexual Activity   Alcohol use: Yes    Alcohol/week: 14.0 standard drinks of alcohol    Types: 14 Cans of beer per week   Drug use: Never   Sexual activity: Not on file  Other Topics Concern   Not on file  Social History Narrative   Not on file   Social Drivers of Health   Financial Resource Strain: Not on file  Food Insecurity: Not on file  Transportation Needs: Not on file  Physical Activity: Not on file  Stress: Not on file  Social Connections: Not on file     Family History:  The patient's family history includes Cancer in his father; Congestive Heart Failure in his maternal grandmother; Diabetes in his brother.   ROS:   Please see the history of present illness.    ROS All other systems reviewed and are negative.     11/07/2018    8:22 AM  PAD Screen  Previous PAD dx? No  Previous surgical procedure? No  Pain with walking? No  Feet/toe relief with dangling? No  Painful, non-healing ulcers? No  Extremities discolored? No     PHYSICAL EXAM:   VS:  BP 112/68   Pulse 69   Ht 5' 9 (1.753 m)   Wt 189 lb (85.7 kg)   SpO2 99%   BMI 27.91 kg/m    GEN: Well nourished, well developed, in no acute distress  HEENT: normal  Neck: no JVD, carotid bruits, or masses Cardiac: RRR; no rubs, or gallops,no edema. 2/6 SM at RUSB to LLSB and into carotid arteries Intact distal pulses bilaterally.  Respiratory:  clear to auscultation bilaterally, normal work of breathing GI: soft, nontender, nondistended, +  BS MS: no deformity or atrophy  Skin: warm and dry, no rash Neuro:  Alert and Oriented x 3, Strength and sensation are intact Psych: euthymic mood, full affect  Wt Readings from Last 3 Encounters:  09/14/23 189 lb (85.7 kg)  07/16/23 188 lb 12.8 oz (85.6 kg)  07/17/22 182 lb (82.6 kg)      Studies/Labs Reviewed:   HST< PAP compliance download  Recent  Labs: 09/10/2023: ALT 28; BUN 10; Creatinine, Ser 0.85; Potassium 4.6; Sodium 138; TSH 1.350    ASSESSMENT:    1. OSA (obstructive sleep apnea)   2. 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 This time includes coordination of care and counseling regarding above mentioned problem list. Remainder of non-face-to-face time involved reviewing chart documents/testing relevant to the patient encounter and documentation in the medical record. I have independently reviewed documentation from referring provider  Medication Adjustments/Labs and Tests Ordered: Current medicines are reviewed at length with the patient today.  Concerns regarding medicines are outlined above.  Medication changes, Labs and Tests  ordered today are listed in the Patient Instructions below.  There are no Patient Instructions on file for this visit.   Signed, Wilbert Bihari, MD  09/14/2023 11:12 AM    [facility] [address] [place], [place], [place]  [address] Phone: [phone]; Fax: [phone]

## 2023-09-14 ENCOUNTER — Encounter: Payer: Self-pay | Admitting: Cardiology

## 2023-09-14 ENCOUNTER — Ambulatory Visit: Attending: Cardiology | Admitting: Cardiology

## 2023-09-14 VITALS — BP 112/68 | HR 69 | Ht 69.0 in | Wt 189.0 lb

## 2023-09-14 DIAGNOSIS — G4733 Obstructive sleep apnea (adult) (pediatric): Secondary | ICD-10-CM | POA: Insufficient documentation

## 2023-09-14 DIAGNOSIS — I10 Essential (primary) hypertension: Secondary | ICD-10-CM

## 2023-09-14 NOTE — Patient Instructions (Signed)
 Medication Instructions:  Your physician recommends that you continue on your current medications as directed. Please refer to the Current Medication list given to you today.  *If you need a refill on your cardiac medications before your next appointment, please call your pharmacy*  Lab Work: NONE If you have labs (blood work) drawn today and your tests are completely normal, you will receive your results only by: MyChart Message (if you have MyChart) OR A paper copy in the mail If you have any lab test that is abnormal or we need to change your treatment, we will call you to review the results.  Testing/Procedures: NONE  Follow-Up: At [facility], you and your health needs are our priority.  As part of our continuing mission to provide you with exceptional heart care, our providers are all part of one team.  This team includes your primary Cardiologist (physician) and Advanced Practice Providers or APPs (Physician Assistants and Nurse Practitioners) who all work together to provide you with the care you need, when you need it.  Your next appointment:     Provider:   Shlomo, MD  We recommend signing up for the patient portal called MyChart.  Sign up information is provided on this After Visit Summary.  MyChart is used to connect with patients for Virtual Visits (Telemedicine).  Patients are able to view lab/test results, encounter notes, upcoming appointments, etc.  Non-urgent messages can be sent to your provider as well.   To learn more about what you can do with MyChart, go to [email or web address].

## 2023-09-15 ENCOUNTER — Telehealth: Payer: Self-pay

## 2023-09-15 NOTE — Telephone Encounter (Signed)
 Order for ResMed auto CPAP from 4 to 15cm H2O with heated humidity and mask of choice sent to [facility] today.

## 2023-09-15 NOTE — Telephone Encounter (Signed)
-----   Message from Wilbert Bihari sent at 09/14/2023 11:17 AM EDT ----- ResMed auto CPAP from 4 to 15cm H2O with heated humidity and mask of choice

## 2023-09-20 ENCOUNTER — Telehealth: Payer: Self-pay

## 2023-09-20 NOTE — Telephone Encounter (Signed)
 Left message on My Chart with Echo results per Dr. Vanetta Shawl note. Routed to PCP.

## 2023-09-24 ENCOUNTER — Other Ambulatory Visit: Payer: Self-pay | Admitting: Cardiology

## 2023-09-27 ENCOUNTER — Telehealth: Payer: Self-pay

## 2023-09-27 NOTE — Telephone Encounter (Signed)
 Echo Results reviewed with pt as per Dr. Vanetta Shawl note.  Pt verbalized understanding and had no additional questions. Routed to PCP

## 2023-09-27 NOTE — Telephone Encounter (Signed)
 Left message on My Chart with lab results per Dr. Karry note. Routed to PCP

## 2023-09-27 NOTE — Telephone Encounter (Signed)
 Lab Results reviewed with pt as per Dr. Karry note.  Pt verbalized understanding and had no additional questions. Routed to PCP

## 2023-09-28 ENCOUNTER — Telehealth: Payer: Self-pay | Admitting: Cardiology

## 2023-09-28 NOTE — Telephone Encounter (Signed)
*  STAT* If patient is at the pharmacy, call can be transferred to refill team.   1. Which medications need to be refilled? (please list name of each medication and dose if known)   atorvastatin  (LIPITOR) 40 MG tablet  metoprolol  succinate (TOPROL -XL) 25 MG 24 hr tablet    2. Would you like to learn more about the convenience, safety, & potential cost savings by using the [facility]? No   3. Are you open to using the [facility] (Type [facility].) No   4. Which pharmacy/location (including street and city if local pharmacy) is medication to be sent to? [id number] - [place], [place] - [address]    5. Do they need a 30 day or 90 day supply? 90 day

## 2023-09-29 ENCOUNTER — Other Ambulatory Visit: Payer: Self-pay

## 2023-09-29 MED ORDER — ATORVASTATIN CALCIUM 40 MG PO TABS
40.0000 mg | ORAL_TABLET | Freq: Every day | ORAL | 2 refills | Status: AC
Start: 2023-09-29 — End: ?

## 2023-09-29 MED ORDER — METOPROLOL SUCCINATE ER 25 MG PO TB24
25.0000 mg | ORAL_TABLET | Freq: Every day | ORAL | 2 refills | Status: AC
Start: 2023-09-29 — End: ?

## 2024-01-12 ENCOUNTER — Ambulatory Visit: Attending: Cardiology | Admitting: Cardiology

## 2024-01-12 ENCOUNTER — Encounter: Payer: Self-pay | Admitting: Cardiology

## 2024-01-12 VITALS — BP 90/72 | HR 73 | Ht 69.0 in | Wt 167.0 lb

## 2024-01-12 DIAGNOSIS — I350 Nonrheumatic aortic (valve) stenosis: Secondary | ICD-10-CM | POA: Diagnosis not present

## 2024-01-12 DIAGNOSIS — I77810 Thoracic aortic ectasia: Secondary | ICD-10-CM

## 2024-01-12 DIAGNOSIS — Z95828 Presence of other vascular implants and grafts: Secondary | ICD-10-CM

## 2024-01-12 DIAGNOSIS — I10 Essential (primary) hypertension: Secondary | ICD-10-CM

## 2024-01-12 DIAGNOSIS — Q2381 Bicuspid aortic valve: Secondary | ICD-10-CM

## 2024-01-12 DIAGNOSIS — Z952 Presence of prosthetic heart valve: Secondary | ICD-10-CM

## 2024-01-12 DIAGNOSIS — E782 Mixed hyperlipidemia: Secondary | ICD-10-CM

## 2024-01-12 DIAGNOSIS — R5383 Other fatigue: Secondary | ICD-10-CM

## 2024-01-12 NOTE — Progress Notes (Signed)
 Cardiology Office Note:    Date:  01/12/2024   ID:  Cody Hebert, DOB 1960-10-01, MRN [id number]  PCP:  Keren Vicenta BRAVO, MD  Cardiologist:  Lamar Fitch, MD    Referring MD: Keren Vicenta BRAVO, MD   Chief Complaint  Patient presents with   Follow-up  Doing fine but weak and tired  History of Present Illness:    Cody Hebert is a 63 y.o. male past medical history significant for severe arctic stenosis secondary to bicuspid arctic valve, status post aortic valve replacement with 23 mm Edwards Inspira Resilia valve with supra coronary replacement of the ascending aorta with 28 mm Hemashield graft due to acute dissection in the operating room that happened on 12/08/2018 surgery was complicated by episode of atrial fibrillation managed with amiodarone .  He still continues to smoke, he does have obstructive sleep apnea comes for regular follow-up complain of being weak tired exhausted.  And he thinks may be problem with testosterone .  He does have CPAP mask but does not like to use it.  Recent echocardiogram showed normal functioning valve recent CT angio of his chest show normal position of the graft.  Past Medical History:  Diagnosis Date   Aortic stenosis    Ascending aorta dilatation    Bicuspid aortic valve 11/07/2018   Chest pain of uncertain etiology 11/07/2018   Essential hypertension 01/09/2019   GERD (gastroesophageal reflux disease)    Hyperlipidemia    Mixed hyperlipidemia 11/07/2018   OSA (obstructive sleep apnea)    Mild OSA with an AHI of 10.2/hr   Pneumonia    hx in the past   Post operative atrial fibrillation 01/09/2019   S/P ascending aortic replacement 12/09/2018   S/P AVR (aortic valve replacement) and aortoplasty 12/08/2018   Severe aortic valve stenosis     Past Surgical History:  Procedure Laterality Date   ANTERIOR CRUCIATE LIGAMENT REPAIR     AORTIC VALVE REPLACEMENT N/A 12/08/2018   Procedure: AORTIC VALVE REPLACEMENT (AVR);  Surgeon: Lucas Dorise POUR, MD;  Location: [facility] OR;  Service: Open Heart Surgery;  Laterality: N/A;   EYE SURGERY     REPLACEMENT ASCENDING AORTA N/A 12/08/2018   Procedure: Replacement Ascending Aorta SUPRACORONARY;  Surgeon: Lucas Dorise POUR, MD;  Location: [facility] OR;  Service: Open Heart Surgery;  Laterality: N/A;   RIGHT/LEFT HEART CATH AND CORONARY ANGIOGRAPHY N/A 11/22/2018   Procedure: RIGHT/LEFT HEART CATH AND CORONARY ANGIOGRAPHY;  Surgeon: Dann Candyce RAMAN, MD;  Location[facility] INVASIVE CV LAB;  Service: Cardiovascular;  Laterality: N/A;   TEE WITHOUT CARDIOVERSION N/A 12/08/2018   Procedure: TRANSESOPHAGEAL ECHOCARDIOGRAM (TEE);  Surgeon: Lucas Dorise POUR, MD;  Location: [facility] OR;  Service: Open Heart Surgery;  Laterality: N/A;   VASECTOMY      Current Medications: Current Meds  Medication Sig   aspirin  EC 81 MG tablet Take 1 tablet (81 mg total) by mouth daily.   atorvastatin  (LIPITOR) 40 MG tablet Take 1 tablet (40 mg total) by mouth daily.   Coenzyme Q10 (COQ10) 100 MG CAPS Take 100 mg by mouth at bedtime.   loratadine (CLARITIN) 10 MG tablet Take 10 mg by mouth daily.   Magnesium  250 MG TABS Take 250 mg by mouth in the morning and at bedtime.   metoprolol  succinate (TOPROL -XL) 25 MG 24 hr tablet Take 1 tablet (25 mg total) by mouth daily.   Milk Thistle 200 MG CAPS Take 200 mg by mouth daily.   Multiple Vitamin (MULTIVITAMIN) capsule Take 1 capsule by mouth  daily.   naproxen sodium (ALEVE) 220 MG tablet Take 440 mg by mouth 2 (two) times daily as needed (pain.).   nitroGLYCERIN  (NITROSTAT ) 0.4 MG SL tablet PLACE 1 TABLET (0.4 MG TOTAL) UNDER THE TONGUE EVERY 5 (FIVE) MINUTES AS NEEDED FOR CHEST PAIN.   omeprazole (PRILOSEC) 20 MG capsule Take 20 mg by mouth daily.   OVER THE COUNTER MEDICATION Take 1 tablet by mouth daily. Beet Root     Allergies:   Doxycycline   Social History   Socioeconomic History   Marital status: Married    Spouse name: Not on file   Number of children: Not on file   Years  of education: Not on file   Highest education level: Not on file  Occupational History   Not on file  Tobacco Use   Smoking status: Every Day    Current packs/day: 1.50    Average packs/day: 1.5 packs/day for 40.0 years (60.0 ttl pk-yrs)    Types: Cigarettes   Smokeless tobacco: Never  Vaping Use   Vaping status: Former  Substance and Sexual Activity   Alcohol use: Yes    Alcohol/week: 14.0 standard drinks of alcohol    Types: 14 Cans of beer per week   Drug use: Never   Sexual activity: Not on file  Other Topics Concern   Not on file  Social History Narrative   Not on file   Social Drivers of Health   Financial Resource Strain: Not on file  Food Insecurity: Not on file  Transportation Needs: Not on file  Physical Activity: Not on file  Stress: Not on file  Social Connections: Not on file     Family History: The patient's family history includes Cancer in his father; Congestive Heart Failure in his maternal grandmother; Diabetes in his brother. ROS:   Please see the history of present illness.    All 14 point review of systems negative except as described per history of present illness  EKGs/Labs/Other Studies Reviewed:         Recent Labs: 09/10/2023: ALT 28; BUN 10; Creatinine, Ser 0.85; Potassium 4.6; Sodium 138; TSH 1.350  Recent Lipid Panel    Component Value Date/Time   CHOL 179 09/10/2023 0812   TRIG 242 (H) 09/10/2023 0812   HDL 45 09/10/2023 0812   CHOLHDL 4.0 09/10/2023 0812   LDLCALC 93 09/10/2023 0812    Physical Exam:    VS:  BP 90/72   Pulse 73   Ht 5' 9 (1.753 m)   Wt 167 lb (75.8 kg)   SpO2 98%   BMI 24.66 kg/m     Wt Readings from Last 3 Encounters:  01/12/24 167 lb (75.8 kg)  09/14/23 189 lb (85.7 kg)  07/16/23 188 lb 12.8 oz (85.6 kg)     GEN:  Well nourished, well developed in no acute distress HEENT: Normal NECK: No JVD; No carotid bruits LYMPHATICS: No lymphadenopathy CARDIAC: RRR, no murmurs, no rubs, no  gallops RESPIRATORY:  Clear to auscultation without rales, wheezing or rhonchi  ABDOMEN: Soft, non-tender, non-distended MUSCULOSKELETAL:  No edema; No deformity  SKIN: Warm and dry LOWER EXTREMITIES: no swelling NEUROLOGIC:  Alert and oriented x 3 PSYCHIATRIC:  Normal affect   ASSESSMENT:    1. Bicuspid aortic valve   2. Essential hypertension   3. Ascending aorta dilatation   4. Severe aortic valve stenosis   5. S/P AVR (aortic valve replacement) and aortoplasty   6. S/P ascending aortic replacement   7. Mixed  hyperlipidemia    PLAN:    In order of problems listed above:  Bicuspid arctic valve status post aortic valve replacement last echocardiogram showed normal function continue monitoring. Status post ascending aorta replacement stable. Mixed dyslipidemia I did review K[facility] which show me his LDL of 93 HDL 45 we will schedule him to have fasting lipid profile done. Essential hypertension blood pressure actually on the lower side. Weakness and fatigue he thinks may be related to low testosterone .  Will check his testosterone  today as per his wishes   Medication Adjustments/Labs and Tests Ordered: Current medicines are reviewed at length with the patient today.  Concerns regarding medicines are outlined above.  No orders of the defined types were placed in this encounter.  Medication changes: No orders of the defined types were placed in this encounter.   Signed, Lamar DOROTHA Fitch, MD, [facility] 01/12/2024 9:44 AM    [facility]

## 2024-01-12 NOTE — Patient Instructions (Signed)

## 2024-01-12 NOTE — Addendum Note (Signed)
 Addended by: ARLOA PLANAS D on: 01/12/2024 09:51 AM   Modules accepted: Orders

## 2024-01-13 LAB — LIPID PANEL
Chol/HDL Ratio: 5 ratio (ref 0.0–5.0)
Cholesterol, Total: 206 mg/dL — ABNORMAL HIGH (ref 100–199)
HDL: 41 mg/dL (ref >39)
LDL Chol Calc (NIH): 149 mg/dL — ABNORMAL HIGH (ref 0–99)
Triglycerides: 89 mg/dL (ref 0–149)
VLDL Cholesterol Cal: 16 mg/dL (ref 5–40)

## 2024-01-13 LAB — TESTOSTERONE: Testosterone: 617 ng/dL (ref 264–916)

## 2024-01-13 LAB — LDL CHOLESTEROL, DIRECT: LDL Direct: 147 mg/dL — ABNORMAL HIGH (ref 0–99)

## 2024-01-21 ENCOUNTER — Ambulatory Visit: Payer: Self-pay | Admitting: Cardiology

## 2024-02-01 NOTE — Telephone Encounter (Signed)
-----   Message from Lamar Fitch, MD sent at 01/21/2024  9:21 AM EST ----- Cholesterol dramatically worse than before so 4 months ago was perfect and now is very high.  Is he taking his cholesterol medication, if not he need to go back on it ----- Message ----- From: Interface, [facility] Lab Results In Sent: 01/13/2024   5:38 AM EST To: Lamar JINNY Fitch, MD

## 2024-02-01 NOTE — Telephone Encounter (Signed)
 left vm to return call

## 2024-02-22 ENCOUNTER — Telehealth: Payer: Self-pay | Admitting: Cardiology

## 2024-02-22 NOTE — Telephone Encounter (Signed)
 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

## 2024-03-02 ENCOUNTER — Other Ambulatory Visit: Payer: Self-pay

## 2024-03-02 ENCOUNTER — Telehealth: Payer: Self-pay

## 2024-03-02 DIAGNOSIS — Q2381 Bicuspid aortic valve: Secondary | ICD-10-CM

## 2024-03-02 NOTE — Telephone Encounter (Signed)
 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

## 2024-03-07 ENCOUNTER — Telehealth: Payer: Self-pay | Admitting: *Deleted

## 2024-03-07 NOTE — Telephone Encounter (Signed)
 Pt given instructions for stress test. Hold metoprolol .

## 2024-03-08 ENCOUNTER — Ambulatory Visit: Attending: Cardiology

## 2024-03-08 DIAGNOSIS — Z0289 Encounter for other administrative examinations: Secondary | ICD-10-CM | POA: Diagnosis not present

## 2024-03-08 DIAGNOSIS — E782 Mixed hyperlipidemia: Secondary | ICD-10-CM

## 2024-03-08 DIAGNOSIS — Q2381 Bicuspid aortic valve: Secondary | ICD-10-CM | POA: Diagnosis not present

## 2024-03-08 MED ORDER — TECHNETIUM TC 99M TETROFOSMIN IV KIT
10.9000 | PACK | Freq: Once | INTRAVENOUS | Status: AC | PRN
  Administered 2024-03-08: 10.9 via INTRAVENOUS

## 2024-03-08 MED ORDER — TECHNETIUM TC 99M TETROFOSMIN IV KIT
30.2000 | PACK | Freq: Once | INTRAVENOUS | Status: AC | PRN
  Administered 2024-03-08: 30.2 via INTRAVENOUS

## 2024-03-10 LAB — MYOCARDIAL PERFUSION IMAGING
Angina Index: 0
Duke Treadmill Score: 8
Estimated workload: 10.1
Exercise duration (min): 8 min
Exercise duration (sec): 1 s
LV dias vol: 104 mL (ref 62–150)
LV sys vol: 46 mL
MPHR: 159 {beats}/min
Nuc Stress EF: 56 %
Peak HR: 142 {beats}/min
Percent HR: 89 %
RPE: 19
Rest HR: 75 {beats}/min
Rest Nuclear Isotope Dose: 10.9 mCi
SDS: 1
SRS: 5
SSS: 6
ST Depression (mm): 0 mm
Stress Nuclear Isotope Dose: 30.2 mCi
TID: 1

## 2024-03-14 ENCOUNTER — Ambulatory Visit: Payer: Self-pay | Admitting: Cardiology

## 2024-03-24 ENCOUNTER — Telehealth: Payer: Self-pay

## 2024-03-24 DIAGNOSIS — Q2381 Bicuspid aortic valve: Secondary | ICD-10-CM

## 2024-03-24 NOTE — Telephone Encounter (Signed)
 Echocardiogram ordered per Dr. Krasowski to complete DOT requirements for Valve replacement
